# Patient Record
Sex: Female | Born: 1980 | Hispanic: Yes | Marital: Married | Smoking: Never smoker
Health system: Southern US, Community
[De-identification: ages and names within clinical notes are randomized; demographics above are authoritative.]

## PROBLEM LIST (undated history)

## (undated) ENCOUNTER — Emergency Department (HOSPITAL_COMMUNITY): Payer: Medicaid Other

## (undated) DIAGNOSIS — Z9889 Other specified postprocedural states: Secondary | ICD-10-CM

## (undated) DIAGNOSIS — N3001 Acute cystitis with hematuria: Secondary | ICD-10-CM

## (undated) DIAGNOSIS — N12 Tubulo-interstitial nephritis, not specified as acute or chronic: Secondary | ICD-10-CM

## (undated) DIAGNOSIS — K4090 Unilateral inguinal hernia, without obstruction or gangrene, not specified as recurrent: Secondary | ICD-10-CM

## (undated) DIAGNOSIS — E781 Pure hyperglyceridemia: Secondary | ICD-10-CM

## (undated) DIAGNOSIS — E119 Type 2 diabetes mellitus without complications: Secondary | ICD-10-CM

## (undated) HISTORY — PX: TUBAL LIGATION: SHX77

## (undated) HISTORY — PX: ECTOPIC PREGNANCY SURGERY: SHX613

---

## 2004-08-20 ENCOUNTER — Emergency Department: Payer: Self-pay | Admitting: Emergency Medicine

## 2004-08-21 ENCOUNTER — Ambulatory Visit: Payer: Self-pay | Admitting: Emergency Medicine

## 2005-02-07 ENCOUNTER — Observation Stay: Payer: Self-pay

## 2005-04-13 ENCOUNTER — Inpatient Hospital Stay: Payer: Self-pay

## 2008-06-13 ENCOUNTER — Emergency Department: Payer: Self-pay | Admitting: Emergency Medicine

## 2011-01-11 ENCOUNTER — Observation Stay: Payer: Self-pay | Admitting: Obstetrics and Gynecology

## 2011-01-30 ENCOUNTER — Observation Stay: Payer: Self-pay | Admitting: Obstetrics and Gynecology

## 2011-02-01 ENCOUNTER — Inpatient Hospital Stay: Payer: Self-pay

## 2011-02-28 ENCOUNTER — Ambulatory Visit: Payer: Self-pay | Admitting: Obstetrics and Gynecology

## 2011-03-01 ENCOUNTER — Ambulatory Visit: Payer: Self-pay | Admitting: Obstetrics and Gynecology

## 2017-08-26 ENCOUNTER — Emergency Department: Payer: Medicaid Other

## 2017-08-26 ENCOUNTER — Other Ambulatory Visit: Payer: Self-pay

## 2017-08-26 ENCOUNTER — Emergency Department
Admission: EM | Admit: 2017-08-26 | Discharge: 2017-08-26 | Disposition: A | Discharge status: Home / Self Care | Payer: Medicaid Other | Attending: Emergency Medicine | Admitting: Emergency Medicine

## 2017-08-26 ENCOUNTER — Encounter: Payer: Self-pay | Admitting: Emergency Medicine

## 2017-08-26 DIAGNOSIS — G510 Bell's palsy: Secondary | ICD-10-CM | POA: Diagnosis not present

## 2017-08-26 DIAGNOSIS — R51 Headache: Secondary | ICD-10-CM | POA: Diagnosis present

## 2017-08-26 LAB — URINALYSIS, COMPLETE (UACMP) WITH MICROSCOPIC
Bacteria, UA: NONE SEEN
Bilirubin Urine: NEGATIVE
Glucose, UA: NEGATIVE mg/dL
HGB URINE DIPSTICK: NEGATIVE
Ketones, ur: NEGATIVE mg/dL
Leukocytes, UA: NEGATIVE
NITRITE: NEGATIVE
PH: 5 (ref 5.0–8.0)
Protein, ur: NEGATIVE mg/dL
SPECIFIC GRAVITY, URINE: 1.026 (ref 1.005–1.030)

## 2017-08-26 LAB — CBC WITH DIFFERENTIAL/PLATELET
BASOS ABS: 0.1 10*3/uL (ref 0–0.1)
BASOS PCT: 1 %
EOS ABS: 0.2 10*3/uL (ref 0–0.7)
Eosinophils Relative: 2 %
HCT: 40.1 % (ref 35.0–47.0)
HEMOGLOBIN: 13.5 g/dL (ref 12.0–16.0)
Lymphocytes Relative: 40 %
Lymphs Abs: 4.1 10*3/uL — ABNORMAL HIGH (ref 1.0–3.6)
MCH: 29.3 pg (ref 26.0–34.0)
MCHC: 33.6 g/dL (ref 32.0–36.0)
MCV: 87.2 fL (ref 80.0–100.0)
MONOS PCT: 5 %
Monocytes Absolute: 0.5 10*3/uL (ref 0.2–0.9)
NEUTROS PCT: 52 %
Neutro Abs: 5.2 10*3/uL (ref 1.4–6.5)
Platelets: 348 10*3/uL (ref 150–440)
RBC: 4.6 MIL/uL (ref 3.80–5.20)
RDW: 13.8 % (ref 11.5–14.5)
WBC: 10.1 10*3/uL (ref 3.6–11.0)

## 2017-08-26 LAB — BASIC METABOLIC PANEL
ANION GAP: 11 (ref 5–15)
BUN: 14 mg/dL (ref 6–20)
CALCIUM: 8.9 mg/dL (ref 8.9–10.3)
CO2: 25 mmol/L (ref 22–32)
CREATININE: 0.59 mg/dL (ref 0.44–1.00)
Chloride: 102 mmol/L (ref 101–111)
GFR calc non Af Amer: >60 mL/min (ref >60)
Glucose, Bld: 158 mg/dL — ABNORMAL HIGH (ref 65–99)
Potassium: 3.8 mmol/L (ref 3.5–5.1)
SODIUM: 138 mmol/L (ref 135–145)

## 2017-08-26 MED ORDER — DIPHENHYDRAMINE HCL 50 MG/ML IJ SOLN
50.0000 mg | Freq: Once | INTRAMUSCULAR | Status: AC
  Administered 2017-08-26: 50 mg via INTRAMUSCULAR
  Filled 2017-08-26: qty 1

## 2017-08-26 MED ORDER — PREDNISONE 20 MG PO TABS: 20.0000 mg | ORAL_TABLET | Freq: Every day | ORAL | 0 refills | Status: AC

## 2017-08-26 MED ORDER — VALACYCLOVIR HCL 1 G PO TABS: 1000.0000 mg | ORAL_TABLET | Freq: Three times a day (TID) | ORAL | 0 refills | Status: DC

## 2017-08-26 MED ORDER — METHYLPREDNISOLONE SODIUM SUCC 125 MG IJ SOLR
125.0000 mg | Freq: Once | INTRAMUSCULAR | Status: AC
  Administered 2017-08-26: 125 mg via INTRAMUSCULAR
  Filled 2017-08-26: qty 2

## 2017-08-26 MED ORDER — PROMETHAZINE HCL 25 MG/ML IJ SOLN
25.0000 mg | Freq: Once | INTRAMUSCULAR | Status: AC
  Administered 2017-08-26: 25 mg via INTRAMUSCULAR
  Filled 2017-08-26: qty 1

## 2017-08-26 NOTE — ED Triage Notes (Signed)
Pt reports that she has had a headache since yesterday. No nausea. Pt states that she has been taking Ibuprofen it is not working. She reports that it is on the right side. Pt also reports that when she drinks water it comes out dribbles out her mouth.

## 2017-08-26 NOTE — ED Provider Notes (Signed)
[facility] Emergency Department Provider Note  ____________________________________________  Time seen: Approximately 3:56 PM  I have reviewed the triage vital signs and the nursing notes.   HISTORY  Chief Complaint Headache    HPI Claudia Cannon is a 37 y.o. female who presents the emergency department complaining of right occipital headache and drooling out of the right side of the mouth when trying to drink.  Patient reports that she developed a headache yesterday, noticed that she had other symptoms today.  Patient denies any numbness and tingling on one side of the body or other.  She denies any ataxia.  No history of headaches including migraines.  Patient does have allergies but states that she has not had increased allergic rhinitis symptoms.  Patient did have cold-like symptoms 3-4 weeks ago that resolved on its own with no treatment.  Patient denies any visual changes, difficulty breathing or swallowing, chest pain, shortness of breath abdominal pain, nausea vomiting, diarrhea or constipation.  History reviewed. No pertinent past medical history.  There are no active problems to display for this patient.   History reviewed. No pertinent surgical history.  Prior to Admission medications   Medication Sig Start Date End Date Taking? Authorizing Provider  predniSONE (DELTASONE) 20 MG tablet Take 1 tablet (20 mg total) by mouth daily for 21 days. Take 3 tablets daily for 1 week, then take 2 tablets daily for 1 week, and take 1 tablet daily for one week 08/26/17 09/16/17  Venida Tsukamoto, Delorise Royals, PA-C  valACYclovir (VALTREX) 1000 MG tablet Take 1 tablet (1,000 mg total) by mouth 3 (three) times daily. 08/26/17   Dalayla Aldredge, Delorise Royals, PA-C    Allergies Patient has no known allergies.  No family history on file.  Social History Social History   Tobacco Use  . Smoking status: Never Smoker  Substance Use Topics  . Alcohol use: No    Frequency:  Never  . Drug use: No     Review of Systems  Constitutional: No fever/chills Eyes: No visual changes. No discharge ENT: Drooling from the right side of mouth while trying to drink. Cardiovascular: no chest pain. Respiratory: no cough. No SOB. Gastrointestinal: No abdominal pain.  No nausea, no vomiting.  No diarrhea.  No constipation. Musculoskeletal: Negative for musculoskeletal pain. Skin: Negative for rash, abrasions, lacerations, ecchymosis. Neurological: Positive for occipital headache.  Positive for "drooling" on the right side of the mouth when drinking. 10-point ROS otherwise negative.  ____________________________________________   PHYSICAL EXAM:  VITAL SIGNS: ED Triage Vitals  Enc Vitals Group     BP 08/26/17 1322 (!) 164/80     Pulse Rate 08/26/17 1322 98     Resp 08/26/17 1322 20     Temp 08/26/17 1322 98.7 F (37.1 C)     Temp Source 08/26/17 1322 Oral     SpO2 08/26/17 1322 97 %     Weight 08/26/17 1319 150 lb (68 kg)     Height 08/26/17 1319 5' (1.524 m)     Head Circumference --      Peak Flow --      Pain Score 08/26/17 1319 9     Pain Loc --      Pain Edu? --      Excl. in GC? --      Constitutional: Alert and oriented. Well appearing and in no acute distress. Eyes: Conjunctivae are normal. PERRL. EOMI. eyelid on right is not spontaneously closing.  Patient can close eyelid with concentrated effort.  Head: Atraumatic. ENT:      Ears:       Nose: No congestion/rhinnorhea.      Mouth/Throat: Mucous membranes are moist.  Neck: No stridor.   Hematological/Lymphatic/Immunilogical: No cervical lymphadenopathy. Cardiovascular: Normal rate, regular rhythm. Normal S1 and S2.  Good peripheral circulation. Respiratory: Normal respiratory effort without tachypnea or retractions. Lungs CTAB. Good air entry to the bases with no decreased or absent breath sounds. Musculoskeletal: Full range of motion to all extremities. No gross deformities  appreciated. Neurologic:  Normal speech and language. No gross focal neurologic deficits are appreciated. No facial droop noted.  Right eyelid does not close without concentrated effort.  Patient with mild deficits to facial/trigeminal nerve testing.  Otherwise, cranial nerves grossly intact.  Sensation intact all dermatomal distributions of the face. Skin:  Skin is warm, dry and intact. No rash noted.   Psychiatric: Mood and affect are normal. Speech and behavior are normal. Patient exhibits appropriate insight and judgement.   ____________________________________________   LABS (all labs ordered are listed, but only abnormal results are displayed)  Labs Reviewed  CBC WITH DIFFERENTIAL/PLATELET - Abnormal; Notable for the following components:      Result Value   Lymphs Abs 4.1 (*)    All other components within normal limits  BASIC METABOLIC PANEL - Abnormal; Notable for the following components:   Glucose, Bld 158 (*)    All other components within normal limits  URINALYSIS, COMPLETE (UACMP) WITH MICROSCOPIC - Abnormal; Notable for the following components:   Color, Urine YELLOW (*)    APPearance HAZY (*)    Squamous Epithelial / LPF 0-5 (*)    All other components within normal limits   ____________________________________________  EKG   ____________________________________________  RADIOLOGY Festus BarrenI, Kasee Hantz D Symiah Nowotny, personally viewed and evaluated these images (plain radiographs) as part of my medical decision making, as well as reviewing the written report by the radiologist.  Ct Head Wo Contrast  Result Date: 08/26/2017 CLINICAL DATA:  Chronic headaches EXAM: CT HEAD WITHOUT CONTRAST TECHNIQUE: Contiguous axial images were obtained from the base of the skull through the vertex without intravenous contrast. COMPARISON:  None. FINDINGS: Brain: No evidence of acute infarction, hemorrhage, hydrocephalus, extra-axial collection or mass lesion/mass effect. Vascular: No  hyperdense vessel or unexpected calcification. Skull: Normal. Negative for fracture or focal lesion. Sinuses/Orbits: No acute finding. Other: None. IMPRESSION: No acute intracranial abnormality noted. Electronically Signed   By: Alcide CleverMark  Lukens M.D.   On: 08/26/2017 13:53    ____________________________________________    PROCEDURES  Procedure(s) performed:    Procedures    Medications  promethazine (PHENERGAN) injection 25 mg (25 mg Intramuscular Given 08/26/17 1619)  diphenhydrAMINE (BENADRYL) injection 50 mg (50 mg Intramuscular Given 08/26/17 1620)  methylPREDNISolone sodium succinate (SOLU-MEDROL) 125 mg/2 mL injection 125 mg (125 mg Intramuscular Given 08/26/17 1621)     ____________________________________________   INITIAL IMPRESSION / ASSESSMENT AND PLAN / ED COURSE  Pertinent labs & imaging results that were available during my care of the patient were reviewed by me and considered in my medical decision making (see chart for details).  Review of the [place] CSRS was performed in accordance of the [facility] prior to dispensing any controlled drugs.     Patient's diagnosis is consistent with Bell's palsy.  Patient presented to the emergency department complaining of drooling out of the right side of her mouth, headache.  Initial differential included CVA both ischemic and hemorrhagic, migraine, tension type headache, sinusitis, sinus headache, Bell's palsy.  CT  scan reveals no acute intracranial or osseous abnormality.  On exam, patient had deficits in facial and trigeminal nerve testing with difficulty closing the right eyelid.  On exam, patient symptoms are most consistent with Bell's palsy.  She did have a recent viral infection 3 weeks prior.  At this time, patient is given medications for her headache is giving her a migraine cocktail emergency department.  Patient will be placed on antivirals and prednisone for Bell's palsy.  Patient does have a primary care provider and is to  follow-up with them closely for continued monitoring through her course of Bell's palsy.  Patient is given instructions on what to expect.  Patient is given return precautions should anything change prior to seeing primary care.  Patient is given ED precautions to return to the ED for any worsening or new symptoms.     ____________________________________________  FINAL CLINICAL IMPRESSION(S) / ED DIAGNOSES  Final diagnoses:  Bell's palsy      NEW MEDICATIONS STARTED DURING THIS VISIT:  ED Discharge Orders        Ordered    valACYclovir (VALTREX) 1000 MG tablet  3 times daily     08/26/17 1709    predniSONE (DELTASONE) 20 MG tablet  Daily     08/26/17 1709          This chart was dictated using voice recognition software/Dragon. Despite best efforts to proofread, errors can occur which can change the meaning. Any change was purely unintentional.    Racheal Patches, PA-C 08/26/17 1712    Phineas Semen, MD 08/26/17 1946

## 2017-08-26 NOTE — ED Triage Notes (Signed)
Pt reports that the water dripping out her mouth when swishing water in her mouth at 0500 while brushing her teeth. No facial droop or other neuro symptoms noted. Headache started yesterday.

## 2017-08-27 LAB — POCT PREGNANCY, URINE: Preg Test, Ur: NEGATIVE

## 2017-09-20 ENCOUNTER — Emergency Department: Payer: Medicaid Other

## 2017-09-20 ENCOUNTER — Inpatient Hospital Stay: Payer: Medicaid Other

## 2017-09-20 ENCOUNTER — Inpatient Hospital Stay
Admission: EM | Admit: 2017-09-20 | Discharge: 2017-09-24 | DRG: 439 | Disposition: A | Discharge status: Home / Self Care | Payer: Medicaid Other | Attending: Internal Medicine | Admitting: Internal Medicine

## 2017-09-20 ENCOUNTER — Encounter: Payer: Self-pay | Admitting: Emergency Medicine

## 2017-09-20 ENCOUNTER — Other Ambulatory Visit: Payer: Self-pay

## 2017-09-20 DIAGNOSIS — K8581 Other acute pancreatitis with uninfected necrosis: Secondary | ICD-10-CM | POA: Diagnosis not present

## 2017-09-20 DIAGNOSIS — R739 Hyperglycemia, unspecified: Secondary | ICD-10-CM

## 2017-09-20 DIAGNOSIS — E1165 Type 2 diabetes mellitus with hyperglycemia: Secondary | ICD-10-CM | POA: Diagnosis present

## 2017-09-20 DIAGNOSIS — K859 Acute pancreatitis without necrosis or infection, unspecified: Secondary | ICD-10-CM | POA: Diagnosis present

## 2017-09-20 DIAGNOSIS — Z8249 Family history of ischemic heart disease and other diseases of the circulatory system: Secondary | ICD-10-CM | POA: Diagnosis not present

## 2017-09-20 DIAGNOSIS — K858 Other acute pancreatitis without necrosis or infection: Secondary | ICD-10-CM | POA: Diagnosis not present

## 2017-09-20 DIAGNOSIS — E781 Pure hyperglyceridemia: Secondary | ICD-10-CM | POA: Diagnosis present

## 2017-09-20 DIAGNOSIS — Z841 Family history of disorders of kidney and ureter: Secondary | ICD-10-CM | POA: Diagnosis not present

## 2017-09-20 DIAGNOSIS — E785 Hyperlipidemia, unspecified: Secondary | ICD-10-CM

## 2017-09-20 DIAGNOSIS — Z8349 Family history of other endocrine, nutritional and metabolic diseases: Secondary | ICD-10-CM | POA: Diagnosis not present

## 2017-09-20 DIAGNOSIS — E871 Hypo-osmolality and hyponatremia: Secondary | ICD-10-CM | POA: Diagnosis present

## 2017-09-20 DIAGNOSIS — R109 Unspecified abdominal pain: Secondary | ICD-10-CM

## 2017-09-20 DIAGNOSIS — E876 Hypokalemia: Secondary | ICD-10-CM | POA: Diagnosis not present

## 2017-09-20 DIAGNOSIS — K760 Fatty (change of) liver, not elsewhere classified: Secondary | ICD-10-CM | POA: Diagnosis present

## 2017-09-20 DIAGNOSIS — Z833 Family history of diabetes mellitus: Secondary | ICD-10-CM | POA: Diagnosis not present

## 2017-09-20 LAB — PHOSPHORUS
PHOSPHORUS: 2.7 mg/dL (ref 2.5–4.6)
Phosphorus: 2.5 mg/dL (ref 2.5–4.6)
Phosphorus: 2 mg/dL — ABNORMAL LOW (ref 2.5–4.6)
Phosphorus: 1.7 mg/dL — ABNORMAL LOW (ref 2.5–4.6)
Phosphorus: 2.5 mg/dL (ref 2.5–4.6)

## 2017-09-20 LAB — COMPREHENSIVE METABOLIC PANEL
ALT: 129 U/L — ABNORMAL HIGH (ref 14–54)
ANION GAP: 10 (ref 5–15)
AST: 72 U/L — ABNORMAL HIGH (ref 15–41)
Albumin: 3.7 g/dL (ref 3.5–5.0)
Alkaline Phosphatase: 66 U/L (ref 38–126)
BILIRUBIN TOTAL: 0.9 mg/dL (ref 0.3–1.2)
BUN: 14 mg/dL (ref 6–20)
CALCIUM: 9.1 mg/dL (ref 8.9–10.3)
CO2: 25 mmol/L (ref 22–32)
CREATININE: 0.56 mg/dL (ref 0.44–1.00)
Chloride: 100 mmol/L — ABNORMAL LOW (ref 101–111)
GFR calc non Af Amer: >60 mL/min (ref >60)
Glucose, Bld: 197 mg/dL — ABNORMAL HIGH (ref 65–99)
Potassium: 3.5 mmol/L (ref 3.5–5.1)
Sodium: 135 mmol/L (ref 135–145)
TOTAL PROTEIN: 6.7 g/dL (ref 6.5–8.1)

## 2017-09-20 LAB — CBC WITH DIFFERENTIAL/PLATELET
Basophils Absolute: 0.1 10*3/uL (ref 0–0.1)
Basophils Relative: 1 %
Eosinophils Absolute: 0.1 10*3/uL (ref 0–0.7)
Eosinophils Relative: 1 %
HEMATOCRIT: 37 % (ref 35.0–47.0)
HEMOGLOBIN: 12.9 g/dL (ref 12.0–16.0)
LYMPHS PCT: 15 %
Lymphs Abs: 2.4 10*3/uL (ref 1.0–3.6)
MCH: 30.9 pg (ref 26.0–34.0)
MCHC: 34.9 g/dL (ref 32.0–36.0)
MCV: 88.5 fL (ref 80.0–100.0)
MONO ABS: 0.7 10*3/uL (ref 0.2–0.9)
MONOS PCT: 5 %
NEUTROS ABS: 12.5 10*3/uL — AB (ref 1.4–6.5)
Neutrophils Relative %: 78 %
Platelets: 216 10*3/uL (ref 150–440)
RBC: 4.18 MIL/uL (ref 3.80–5.20)
RDW: 14.4 % (ref 11.5–14.5)
WBC: 15.9 10*3/uL — ABNORMAL HIGH (ref 3.6–11.0)

## 2017-09-20 LAB — GLUCOSE, CAPILLARY
GLUCOSE-CAPILLARY: 206 mg/dL — AB (ref 65–99)
Glucose-Capillary: 226 mg/dL — ABNORMAL HIGH (ref 65–99)
Glucose-Capillary: 238 mg/dL — ABNORMAL HIGH (ref 65–99)
Glucose-Capillary: 204 mg/dL — ABNORMAL HIGH (ref 65–99)
Glucose-Capillary: 213 mg/dL — ABNORMAL HIGH (ref 65–99)
Glucose-Capillary: 190 mg/dL — ABNORMAL HIGH (ref 65–99)
Glucose-Capillary: 199 mg/dL — ABNORMAL HIGH (ref 65–99)
Glucose-Capillary: 164 mg/dL — ABNORMAL HIGH (ref 65–99)
Glucose-Capillary: 157 mg/dL — ABNORMAL HIGH (ref 65–99)
Glucose-Capillary: 164 mg/dL — ABNORMAL HIGH (ref 65–99)
Glucose-Capillary: 164 mg/dL — ABNORMAL HIGH (ref 65–99)
Glucose-Capillary: 148 mg/dL — ABNORMAL HIGH (ref 65–99)

## 2017-09-20 LAB — BASIC METABOLIC PANEL
ANION GAP: 8 (ref 5–15)
Anion gap: 10 (ref 5–15)
Anion gap: 8 (ref 5–15)
Anion gap: 11 (ref 5–15)
BUN: 9 mg/dL (ref 6–20)
BUN: 6 mg/dL (ref 6–20)
CALCIUM: 7.4 mg/dL — AB (ref 8.9–10.3)
CALCIUM: 7.4 mg/dL — AB (ref 8.9–10.3)
CALCIUM: 7.1 mg/dL — AB (ref 8.9–10.3)
CALCIUM: 6.9 mg/dL — AB (ref 8.9–10.3)
CO2: 24 mmol/L (ref 22–32)
CO2: 23 mmol/L (ref 22–32)
CO2: 25 mmol/L (ref 22–32)
CO2: 24 mmol/L (ref 22–32)
Chloride: 91 mmol/L — ABNORMAL LOW (ref 101–111)
Chloride: 88 mmol/L — ABNORMAL LOW (ref 101–111)
Chloride: 88 mmol/L — ABNORMAL LOW (ref 101–111)
Chloride: 88 mmol/L — ABNORMAL LOW (ref 101–111)
Creatinine, Ser: 0.36 mg/dL — ABNORMAL LOW (ref 0.44–1.00)
Creatinine, Ser: <0.3 mg/dL — ABNORMAL LOW (ref 0.44–1.00)
Creatinine, Ser: 0.31 mg/dL — ABNORMAL LOW (ref 0.44–1.00)
GFR calc Af Amer: >60 mL/min (ref >60)
GFR calc Af Amer: >60 mL/min (ref >60)
GLUCOSE: 212 mg/dL — AB (ref 65–99)
GLUCOSE: 202 mg/dL — AB (ref 65–99)
GLUCOSE: 159 mg/dL — AB (ref 65–99)
GLUCOSE: 152 mg/dL — AB (ref 65–99)
Potassium: 3.5 mmol/L (ref 3.5–5.1)
Potassium: 3.8 mmol/L (ref 3.5–5.1)
Potassium: 3.5 mmol/L (ref 3.5–5.1)
Potassium: 2.9 mmol/L — ABNORMAL LOW (ref 3.5–5.1)
Sodium: 123 mmol/L — ABNORMAL LOW (ref 135–145)
Sodium: 121 mmol/L — ABNORMAL LOW (ref 135–145)
Sodium: 121 mmol/L — ABNORMAL LOW (ref 135–145)
Sodium: 123 mmol/L — ABNORMAL LOW (ref 135–145)

## 2017-09-20 LAB — URINALYSIS, COMPLETE (UACMP) WITH MICROSCOPIC
Bilirubin Urine: NEGATIVE
GLUCOSE, UA: NEGATIVE mg/dL
HGB URINE DIPSTICK: NEGATIVE
Ketones, ur: 20 mg/dL — AB
NITRITE: NEGATIVE
PH: 5 (ref 5.0–8.0)
PROTEIN: NEGATIVE mg/dL
Specific Gravity, Urine: 1.024 (ref 1.005–1.030)

## 2017-09-20 LAB — LIPASE, BLOOD: Lipase: 620 U/L — ABNORMAL HIGH (ref 11–51)

## 2017-09-20 LAB — MRSA PCR SCREENING: MRSA BY PCR: NEGATIVE

## 2017-09-20 LAB — CBC
HCT: 37 % (ref 35.0–47.0)
HEMOGLOBIN: UNDETERMINED g/dL (ref 12.0–16.0)
MCH: UNDETERMINED pg (ref 26.0–34.0)
MCHC: UNDETERMINED g/dL (ref 32.0–36.0)
MCV: 88.2 fL (ref 80.0–100.0)
PLATELETS: 249 10*3/uL (ref 150–440)
RBC: 4.19 MIL/uL (ref 3.80–5.20)
RDW: 14.4 % (ref 11.5–14.5)
WBC: 16.5 10*3/uL — ABNORMAL HIGH (ref 3.6–11.0)

## 2017-09-20 LAB — HEMOGLOBIN A1C
Hgb A1c MFr Bld: 7.4 % — ABNORMAL HIGH (ref 4.8–5.6)
MEAN PLASMA GLUCOSE: 165.68 mg/dL

## 2017-09-20 LAB — POCT PREGNANCY, URINE: Preg Test, Ur: NEGATIVE

## 2017-09-20 LAB — LACTIC ACID, PLASMA
LACTIC ACID, VENOUS: 4.4 mmol/L — AB (ref 0.5–1.9)
LACTIC ACID, VENOUS: 4.4 mmol/L — AB (ref 0.5–1.9)
Lactic Acid, Venous: 3.4 mmol/L (ref 0.5–1.9)

## 2017-09-20 LAB — MAGNESIUM
MAGNESIUM: 2.2 mg/dL (ref 1.7–2.4)
Magnesium: 3.4 mg/dL — ABNORMAL HIGH (ref 1.7–2.4)
Magnesium: 3.1 mg/dL — ABNORMAL HIGH (ref 1.7–2.4)
Magnesium: 3 mg/dL — ABNORMAL HIGH (ref 1.7–2.4)
Magnesium: 2.5 mg/dL — ABNORMAL HIGH (ref 1.7–2.4)

## 2017-09-20 LAB — TRIGLYCERIDES
Triglycerides: >5000 mg/dL — ABNORMAL HIGH (ref <150)
Triglycerides: >5000 mg/dL — ABNORMAL HIGH (ref <150)

## 2017-09-20 LAB — TROPONIN I: Troponin I: <0.03 ng/mL (ref <0.03)

## 2017-09-20 LAB — TSH: TSH: 1.766 u[IU]/mL (ref 0.350–4.500)

## 2017-09-20 MED ORDER — ACETAMINOPHEN 325 MG PO TABS
650.0000 mg | ORAL_TABLET | ORAL | Status: DC | PRN
  Administered 2017-09-21 – 2017-09-24 (×6): 650 mg via ORAL
  Filled 2017-09-20 (×6): qty 2

## 2017-09-20 MED ORDER — FENOFIBRATE 160 MG PO TABS
160.0000 mg | ORAL_TABLET | Freq: Every day | ORAL | Status: DC
  Administered 2017-09-20 – 2017-09-24 (×4): 160 mg via ORAL
  Filled 2017-09-20 (×5): qty 1

## 2017-09-20 MED ORDER — SODIUM CHLORIDE 0.9 % IV BOLUS
1000.0000 mL | Freq: Once | INTRAVENOUS | Status: AC
  Administered 2017-09-20: 1000 mL via INTRAVENOUS

## 2017-09-20 MED ORDER — SODIUM CHLORIDE 0.9 % IV SOLN
INTRAVENOUS | Status: DC
  Administered 2017-09-20 – 2017-09-22 (×4): via INTRAVENOUS
  Filled 2017-09-20 (×8): qty 100

## 2017-09-20 MED ORDER — MORPHINE SULFATE (PF) 2 MG/ML IV SOLN
INTRAVENOUS | Status: AC
  Filled 2017-09-20: qty 1

## 2017-09-20 MED ORDER — POTASSIUM CHLORIDE 10 MEQ/100ML IV SOLN
10.0000 meq | INTRAVENOUS | Status: AC
  Administered 2017-09-20 (×2): 10 meq via INTRAVENOUS
  Filled 2017-09-20 (×2): qty 100

## 2017-09-20 MED ORDER — NIACIN ER 500 MG PO TBCR
500.0000 mg | EXTENDED_RELEASE_TABLET | Freq: Every day | ORAL | Status: DC
Start: 2017-09-20 — End: 2017-09-24
  Administered 2017-09-21 – 2017-09-23 (×3): 500 mg via ORAL
  Filled 2017-09-20 (×5): qty 1

## 2017-09-20 MED ORDER — HYDROMORPHONE HCL 1 MG/ML IJ SOLN
1.0000 mg | INTRAMUSCULAR | Status: DC | PRN
Start: 2017-09-20 — End: 2017-09-24
  Administered 2017-09-20 – 2017-09-23 (×15): 1 mg via INTRAVENOUS
  Filled 2017-09-20 (×16): qty 1

## 2017-09-20 MED ORDER — HEPARIN SODIUM (PORCINE) 5000 UNIT/ML IJ SOLN
5000.0000 [IU] | Freq: Three times a day (TID) | INTRAMUSCULAR | Status: DC
  Administered 2017-09-20 – 2017-09-23 (×8): 5000 [IU] via SUBCUTANEOUS
  Filled 2017-09-20 (×8): qty 1

## 2017-09-20 MED ORDER — MORPHINE SULFATE (PF) 2 MG/ML IV SOLN
2.0000 mg | Freq: Once | INTRAVENOUS | Status: AC
  Administered 2017-09-20: 2 mg via INTRAVENOUS

## 2017-09-20 MED ORDER — ONDANSETRON HCL 4 MG/2ML IJ SOLN
4.0000 mg | Freq: Four times a day (QID) | INTRAMUSCULAR | Status: DC | PRN
Start: 2017-09-20 — End: 2017-09-24
  Administered 2017-09-20 – 2017-09-21 (×4): 4 mg via INTRAVENOUS
  Filled 2017-09-20 (×4): qty 2

## 2017-09-20 MED ORDER — DEXTROSE-NACL 5-0.45 % IV SOLN
INTRAVENOUS | Status: DC
  Administered 2017-09-20: 08:00:00 via INTRAVENOUS

## 2017-09-20 MED ORDER — SODIUM CHLORIDE 0.9 % IV SOLN: INTRAVENOUS | Status: DC

## 2017-09-20 MED ORDER — MORPHINE SULFATE (PF) 4 MG/ML IV SOLN
4.0000 mg | Freq: Once | INTRAVENOUS | Status: AC
  Administered 2017-09-20: 4 mg via INTRAVENOUS
  Filled 2017-09-20: qty 1

## 2017-09-20 MED ORDER — SODIUM CHLORIDE 0.9 % IV SOLN
INTRAVENOUS | Status: DC
  Administered 2017-09-20 – 2017-09-21 (×3): via INTRAVENOUS

## 2017-09-20 MED ORDER — SODIUM CHLORIDE 0.9 % IV SOLN
INTRAVENOUS | Status: DC
  Administered 2017-09-20: 1.7 [IU]/h via INTRAVENOUS
  Filled 2017-09-20: qty 1

## 2017-09-20 MED ORDER — IOPAMIDOL (ISOVUE-300) INJECTION 61%
100.0000 mL | Freq: Once | INTRAVENOUS | Status: AC | PRN
  Administered 2017-09-20: 100 mL via INTRAVENOUS

## 2017-09-20 MED ORDER — DEXTROSE 10 % IV SOLN
INTRAVENOUS | Status: DC
  Administered 2017-09-20 – 2017-09-21 (×3): via INTRAVENOUS

## 2017-09-20 MED ORDER — SODIUM CHLORIDE 0.9 % IV SOLN
INTRAVENOUS | Status: AC
  Administered 2017-09-20: 08:00:00 via INTRAVENOUS

## 2017-09-20 MED ORDER — ROSUVASTATIN CALCIUM 20 MG PO TABS
40.0000 mg | ORAL_TABLET | Freq: Every day | ORAL | Status: DC
  Administered 2017-09-22 – 2017-09-23 (×2): 40 mg via ORAL
  Filled 2017-09-20 (×2): qty 2
  Filled 2017-09-20: qty 1
  Filled 2017-09-20: qty 4
  Filled 2017-09-20: qty 1
  Filled 2017-09-20: qty 4
  Filled 2017-09-20: qty 1

## 2017-09-20 MED ORDER — ENOXAPARIN SODIUM 40 MG/0.4ML ~~LOC~~ SOLN
40.0000 mg | SUBCUTANEOUS | Status: DC
  Administered 2017-09-20: 40 mg via SUBCUTANEOUS
  Filled 2017-09-20: qty 0.4

## 2017-09-20 MED ORDER — SODIUM PHOSPHATES 45 MMOLE/15ML IV SOLN
10.0000 mmol | Freq: Once | INTRAVENOUS | Status: AC
  Administered 2017-09-20: 10 mmol via INTRAVENOUS
  Filled 2017-09-20: qty 3.33

## 2017-09-20 MED ORDER — POTASSIUM CHLORIDE 10 MEQ/100ML IV SOLN
10.0000 meq | INTRAVENOUS | Status: DC
  Filled 2017-09-20 (×6): qty 100

## 2017-09-20 MED ORDER — POTASSIUM CHLORIDE 10 MEQ/100ML IV SOLN
10.0000 meq | INTRAVENOUS | Status: AC
  Administered 2017-09-20 – 2017-09-21 (×5): 10 meq via INTRAVENOUS
  Filled 2017-09-20 (×5): qty 100

## 2017-09-20 MED ORDER — ORAL CARE MOUTH RINSE
15.0000 mL | Freq: Two times a day (BID) | OROMUCOSAL | Status: DC
  Administered 2017-09-20 – 2017-09-23 (×4): 15 mL via OROMUCOSAL

## 2017-09-20 MED ORDER — ACETAMINOPHEN 650 MG RE SUPP
650.0000 mg | Freq: Four times a day (QID) | RECTAL | Status: DC | PRN
  Filled 2017-09-20: qty 1

## 2017-09-20 MED ORDER — SENNOSIDES-DOCUSATE SODIUM 8.6-50 MG PO TABS: 2.0000 | ORAL_TABLET | Freq: Two times a day (BID) | ORAL | Status: DC | PRN

## 2017-09-20 MED ORDER — IOPAMIDOL (ISOVUE-300) INJECTION 61%
15.0000 mL | INTRAVENOUS | Status: AC
  Administered 2017-09-20 (×2): 15 mL via ORAL

## 2017-09-20 MED ORDER — HYDROMORPHONE HCL 1 MG/ML IJ SOLN
2.0000 mg | INTRAMUSCULAR | Status: DC | PRN
  Administered 2017-09-20 – 2017-09-24 (×12): 2 mg via INTRAVENOUS
  Filled 2017-09-20 (×12): qty 2

## 2017-09-20 MED ORDER — ONDANSETRON HCL 4 MG/2ML IJ SOLN
4.0000 mg | Freq: Once | INTRAMUSCULAR | Status: AC
  Administered 2017-09-20: 4 mg via INTRAVENOUS
  Filled 2017-09-20: qty 2

## 2017-09-20 MED ORDER — ACETAMINOPHEN 650 MG RE SUPP
650.0000 mg | Freq: Four times a day (QID) | RECTAL | Status: DC | PRN
  Administered 2017-09-20: 650 mg via RECTAL

## 2017-09-20 NOTE — Progress Notes (Signed)
MEDICATION RELATED CONSULT NOTE - INITIAL   Pharmacy Consult for Electrolyte Management Indication: Pancreatitis on Insulin drip  No Known Allergies  Patient Measurements: Height: 5' (152.4 cm) Weight: 150 lb (68 kg) IBW/kg (Calculated) : 45.5 Adjusted Body Weight:    Vital Signs: Temp: 97.9 F (36.6 C) (04/13 0908) Temp Source: Oral (04/13 0908) BP: 126/76 (04/13 0900) Pulse Rate: 94 (04/13 0900) Intake/Output from previous day: 04/12 0701 - 04/13 0700 In: 1000 [IV Piggyback:1000] Out: -  Intake/Output from this shift: Total I/O In: 1045.9 [I.V.:945.9; IV Piggyback:100] Out: -   Labs: Recent Labs    09/20/17 0201 09/20/17 0610 09/20/17 0836 09/20/17 1258  WBC 16.5*  --   --   --   HGB UNABLE TO REPORT DUE TO LIPEMIC INTERFERENCE  --   --   --   HCT 37.0  --   --   --   PLT 249  --   --   --   CREATININE 0.56  --  0.36* <0.30*  MG  --  3.4* 3.1* 3.0*  PHOS  --  2.7 2.5 2.0*  ALBUMIN 3.7  --   --   --   PROT 6.7  --   --   --   AST 72*  --   --   --   ALT 129*  --   --   --   ALKPHOS 66  --   --   --   BILITOT 0.9  --   --   --    CrCl cannot be calculated (This lab value cannot be used to calculate CrCl because it is not a number: <0.30).   Microbiology: Recent Results (from the past 720 hour(s))  MRSA PCR Screening     Status: None   Collection Time: 09/20/17  9:17 AM  Result Value Ref Range Status   MRSA by PCR NEGATIVE NEGATIVE Final    Comment:        The GeneXpert MRSA Assay (FDA approved for NASAL specimens only), is one component of a comprehensive MRSA colonization surveillance program. It is not intended to diagnose MRSA infection nor to guide or monitor treatment for MRSA infections. Performed at [facility], [address] [place], [place] [id number]     Medical History: History reviewed. No pertinent past medical history.   Assessment: Patient is a 37yo female admitted for pancreatitis. Triglycerides on admission were  elevated > 5000. Patient was initiated on insulin drip and IV fluids. Now patient with electrolyte abnormalities.  K 3.8, Mg 3.0, Phos 2.0, Na 121  Goal of Therapy:  Maintain electrolytes within range  Plan:  Will order NaPhos 10mmol IV once. Will follow up on labs.  Clovia CuffLisa Boluwatife Flight, PharmD, BCPS 09/20/2017 2:45 PM

## 2017-09-20 NOTE — Consult Note (Signed)
[facility] Consultation      Name: Claudia NimrodMargarita Alegria Cannon MRN: [id number] DOB: 1980/11/03    ADMISSION DATE:  09/20/2017 CONSULTATION DATE:  09/20/2017  REFERRING MD :  Barbaraann RondoSridharan, Prasanna, MD   CHIEF COMPLAINT:   Abdominal pain x 10 days   HISTORY OF PRESENT ILLNESS    Claudia NimrodMargarita Alegria Cannon  is a 37 y.o. female with a no known past medical history, who presents with abdominal pain worse in the epighatrium. Pt is Spanish-speaking, and speaks minimal [place]. Her daughter is at the bedside, and is able to translate. Patient states she has been experiencing mild postprandial epigastric pain for the past 10 days. She characterizes the discomfort as a dull pressure/ache. She endorses rapid/early satiety, but denies any progression of the pain over the course of the past 10 days until day of admission. She states that last night, the pain suddenly increased in severity, and became unbearable. The pain radiates to the lower chest at times, and she states she has had difficulty breathing 2/2 pain. She endorses abdominal distension and gas/discomfort. She endorses loss of appetite, but denies nausea and/or vomiting. She denies hematemesis, melena/hematochezia or urinary symptoms. She states she does not have any known active medical problems, and is not presently taking any medications. She does not drink alcohol. Her last PO intake was at ~1700PM on Friday (09/19/2017). Denies F/C, palpitations, diaphoresis, LH/LOC. She is in moderate abdominal discomfort at the time of my examination, but is otherwise healthy appearing.    I have seen and examined her.    SIGNIFICANT EVENTS   4/13 admitted to ICU    PAST MEDICAL HISTORY    :  History reviewed. No pertinent past medical history. History reviewed. No pertinent surgical history. Prior to Admission medications   Medication Sig Start Date End Date Taking? Authorizing Provider  valACYclovir (VALTREX) 1000 MG  tablet Take 1 tablet (1,000 mg total) by mouth 3 (three) times daily. Patient not taking: Reported on 09/20/2017 08/26/17   Cuthriell, Delorise RoyalsJonathan D, PA-C   No Known Allergies   FAMILY HISTORY   Family History  Problem Relation Age of Onset  . Diabetes Mother   . Hypertension Mother   . Hyperlipidemia Mother   . Chronic Renal Failure Mother       SOCIAL HISTORY    reports that she has never smoked. She has never used smokeless tobacco. She reports that she does not drink alcohol or use drugs.  Review of Systems  Constitutional: Positive for malaise/fatigue. Negative for chills, diaphoresis, fever and weight loss.  HENT: Negative for congestion, hearing loss, sore throat and tinnitus.   Eyes: Negative for blurred vision, double vision and redness.  Respiratory: Positive for shortness of breath (+) SOB associated w/ AP/CP per HPI. Negative for cough, hemoptysis, sputum production and wheezing.   Cardiovascular: Positive for chest pain (+) AP radiating to chest per HPI. Negative for palpitations, orthopnea, claudication, leg swelling and PND.  Gastrointestinal: Positive for abdominal pain. Negative for blood in stool, constipation, diarrhea, heartburn, melena, nausea and vomiting.  Genitourinary: Negative for dysuria, frequency, hematuria and urgency.  Musculoskeletal: Negative for back pain, joint pain and neck pain.  Skin: Negative for itching and rash.  Neurological: Positive for weakness (+) generalized weakness. Negative for dizziness, tingling, tremors, focal weakness, loss of consciousness and headaches.       VITAL SIGNS    Temp:  [97.9 F (36.6 C)-98 F (36.7 C)] 97.9 F (36.6 C) (04/13 0908) Pulse Rate:  [  89-98] 94 (04/13 0900) Resp:  [13-26] 19 (04/13 0900) BP: (104-126)/(51-76) 126/76 (04/13 0900) SpO2:  [94 %-98 %] 98 % (04/13 0900) Weight:  [150 lb (68 kg)] 150 lb (68 kg) (04/13 0152)   HEMODYNAMICS:  no compromise  OXYGEN:  Room air  INTAKE /  OUTPUT:  Intake/Output Summary (Last 24 hours) at 09/20/2017 1151 Last data filed at 09/20/2017 0900 Gross per 24 hour  Intake 2045.86 ml  Output -  Net 2045.86 ml       PHYSICAL EXAM   GENERAL:  37 y.o.-year-old patient lying in the bed with no acute distress.  EYES: Pupils equal, round, reactive to light and accommodation. No scleral icterus. Extraocular muscles intact.  HEENT: Head atraumatic, normocephalic. Oropharynx and nasopharynx clear.  NECK:  Supple, no jugular venous distention. No thyroid enlargement, no tenderness.  LUNGS: Normal breath sounds bilaterally, no wheezing, rales,rhonchi or crepitation. No use of accessory muscles of respiration.  CARDIOVASCULAR: S1, S2 normal. No murmurs, rubs, or gallops.  ABDOMEN: Soft, 3+tenderness, 1+ distended. Inaudible Bowel sounds present. No organomegaly or mass.  EXTREMITIES: No pedal edema, cyanosis, or clubbing.  NEUROLOGIC: Cranial nerves II through XII are intact. Muscle strength 5/5 in all extremities. Sensation intact. Gait not checked.  PSYCHIATRIC: The patient is alert and oriented x 3.  SKIN: No obvious rash, lesion, or ulcer.        LABS   LABS:  CBC Recent Labs  Lab 09/20/17 0201  WBC 16.5*  HGB UNABLE TO REPORT DUE TO LIPEMIC INTERFERENCE  HCT 37.0  PLT 249   Coag's No results for input(s): APTT, INR in the last 168 hours. BMET Recent Labs  Lab 09/20/17 0201 09/20/17 0836  NA 135 123*  K 3.5 3.5  CL 100* 91*  CO2 25 24  BUN 14 9  CREATININE 0.56 0.36*  GLUCOSE 197* 212*   Electrolytes Recent Labs  Lab 09/20/17 0201 09/20/17 0610 09/20/17 0836  CALCIUM 9.1  --  7.4*  MG  --  3.4* 3.1*  PHOS  --  2.7 2.5   Sepsis Markers Recent Labs  Lab 09/20/17 1026  LATICACIDVEN 4.4*   ABG No results for input(s): PHART, PCO2ART, PO2ART in the last 168 hours. Liver Enzymes Recent Labs  Lab 09/20/17 0201  AST 72*  ALT 129*  ALKPHOS 66  BILITOT 0.9  ALBUMIN 3.7   Cardiac  Enzymes Recent Labs  Lab 09/20/17 0201  TROPONINI <0.03   Glucose Recent Labs  Lab 09/20/17 0759 09/20/17 0936 09/20/17 1032 09/20/17 1130  GLUCAP 226* 238* 204* 213*     Recent Results (from the past 240 hour(s))  MRSA PCR Screening     Status: None   Collection Time: 09/20/17  9:17 AM  Result Value Ref Range Status   MRSA by PCR NEGATIVE NEGATIVE Final    Comment:        The GeneXpert MRSA Assay (FDA approved for NASAL specimens only), is one component of a comprehensive MRSA colonization surveillance program. It is not intended to diagnose MRSA infection nor to guide or monitor treatment for MRSA infections. Performed at [facility], [address], [place], [place] [address]      Current Facility-Administered Medications:  .  0.9 %  sodium chloride infusion, , Intravenous, Continuous, Barbaraann Rondo, MD, Stopped at 09/20/17 6045 .  0.9 %  sodium chloride infusion, , Intravenous, Continuous, Annett Fabian, MD, Last Rate: 150 mL/hr at 09/20/17 1119 .  dextrose 10 % infusion, , Intravenous, Continuous,  Enid Baas, MD, Last Rate: 100 mL/hr at 09/20/17 0903 .  enoxaparin (LOVENOX) injection 40 mg, 40 mg, Subcutaneous, Q24H, Marjie Skiff, Prasanna, MD, 40 mg at 09/20/17 0902 .  fenofibrate tablet 160 mg, 160 mg, Oral, Daily, Marjie Skiff, Prasanna, MD, 160 mg at 09/20/17 0933 .  HYDROmorphone (DILAUDID) injection 1 mg, 1 mg, Intravenous, Q3H PRN, 1 mg at 09/20/17 0900 **OR** HYDROmorphone (DILAUDID) injection 2 mg, 2 mg, Intravenous, Q3H PRN, Marjie Skiff, Prasanna, MD .  MEDLINE mouth rinse, 15 mL, Mouth Rinse, BID, Kalisetti, Radhika, MD .  morphine 2 MG/ML injection, , , ,  .  niacin CR capsule 500 mg, 500 mg, Oral, QHS, Sridharan, Prasanna, MD .  ondansetron (ZOFRAN) injection 4 mg, 4 mg, Intravenous, Q6H PRN, Marjie Skiff, Prasanna, MD, 4 mg at 09/20/17 1027 .  rosuvastatin (CRESTOR) tablet 40 mg, 40 mg, Oral, q1800, Marjie Skiff, Prasanna, MD .   senna-docusate (Senokot-S) tablet 2 tablet, 2 tablet, Oral, BID PRN, Marjie Skiff, Prasanna, MD .  sodium chloride 0.9 % 100 mL with insulin regular (NOVOLIN R,HUMULIN R) 100 Units infusion, , Intravenous, Continuous, Kalisetti, Radhika, MD, Last Rate: 8 mL/hr at 09/20/17 0932  IMAGING    US Abdomen Limited Ruq  Result Date: 09/20/2017 CLINICAL DATA:  Upper abdominal pain. EXAM: ULTRASOUND ABDOMEN LIMITED RIGHT UPPER QUADRANT COMPARISON:  None. FINDINGS: Gallbladder: Physiologically distended. No gallstones or wall thickening visualized. No sonographic Murphy sign noted by sonographer. Common bile duct: Diameter: 2 mm, normal. Liver: No focal lesion identified. Diffusely increased in parenchymal echogenicity. Portal vein is patent on color Doppler imaging with normal direction of blood flow towards the liver. IMPRESSION: 1. Normal sonographic appearance of the gallbladder and biliary tree. No gallstones. 2. Hepatic steatosis. Electronically Signed   By: Rubye Oaks M.D.   On: 09/20/2017 03:23       MAJOR EVENTS/TEST RESULTS: Per HPI  INDWELLING DEVICES:: Nil  MICRO DATA: MRSA PCR  Urine  Blood Resp   ANTIMICROBIALS:  Nil   ASSESSMENT/PLAN   1. Acute Pancreatitis secondary to Hypertriglyceridemia. Gallstones R/O via ultrasound 2. Hypertriglyceridemia  Plan: 1. IVF, pain control, monitor sepsis marker- lactic acid, procalcitonin. 2. CT Scan of Abdomen and Pelvis as patient with significant pain and tenderness. 3. Treat elevated triglycerides with insulin drip 4.  Target Glucose monitoring 5. Will need longterm  Lipid management therapy 6. GI and DVT prophylaxis 7. Monitor Ranson criteria    Family: patient and daughter updated  I have personally obtained a history, examined the patient, evaluated laboratory and independently reviewed  imaging results, formulated the assessment and plan and placed orders.  The Patient requires high complexity decision making for  assessment and support, frequent evaluation and titration of therapies, application of advanced monitoring technologies and extensive interpretation of multiple databases. Critical Care Time devoted to patient care services described in this note is 40 minutes.   Overall, patient is critically ill, prognosis is guarded. Patient at high risk for cardiac arrest and death.   Jackson Latino, M.D

## 2017-09-20 NOTE — Progress Notes (Signed)
CRITICAL VALUE ALERT  Critical Value:  LA 4.4   Date & Time Notied:  09/20/17 @ 1110  Provider Notified: Dr. Peggye Pittichards  Orders Received/Actions taken: No new orders receieved

## 2017-09-20 NOTE — Progress Notes (Signed)
MEDICATION RELATED CONSULT NOTE - INITIAL   Pharmacy Consult for Electrolyte Management Indication: Pancreatitis on Insulin drip  No Known Allergies  Patient Measurements: Height: 5' (152.4 cm) Weight: 150 lb (68 kg) IBW/kg (Calculated) : 45.5 Adjusted Body Weight:    Vital Signs: Temp: 100 F (37.8 C) (04/13 1850) Temp Source: Oral (04/13 1850) BP: 120/70 (04/13 1900) Pulse Rate: 114 (04/13 1900) Intake/Output from previous day: 04/12 0701 - 04/13 0700 In: 1000 [IV Piggyback:1000] Out: -  Intake/Output from this shift: No intake/output data recorded.  Labs: Recent Labs    09/20/17 0201  09/20/17 1258 09/20/17 1612 09/20/17 2043  WBC 16.5*  --   --   --   --   HGB UNABLE TO REPORT DUE TO LIPEMIC INTERFERENCE  --   --   --   --   HCT 37.0  --   --   --   --   PLT 249  --   --   --   --   CREATININE 0.56   < > <0.30* <0.30* 0.31*  MG  --    < > 3.0* 2.5* 2.2  PHOS  --    < > 2.0* 1.7* 2.5  ALBUMIN 3.7  --   --   --   --   PROT 6.7  --   --   --   --   AST 72*  --   --   --   --   ALT 129*  --   --   --   --   ALKPHOS 66  --   --   --   --   BILITOT 0.9  --   --   --   --    < > = values in this interval not displayed.   Estimated Creatinine Clearance: 85.3 mL/min (A) (by C-G formula based on SCr of 0.31 mg/dL (L)).   Microbiology: Recent Results (from the past 720 hour(s))  MRSA PCR Screening     Status: None   Collection Time: 09/20/17  9:17 AM  Result Value Ref Range Status   MRSA by PCR NEGATIVE NEGATIVE Final    Comment:        The GeneXpert MRSA Assay (FDA approved for NASAL specimens only), is one component of a comprehensive MRSA colonization surveillance program. It is not intended to diagnose MRSA infection nor to guide or monitor treatment for MRSA infections. Performed at [facility], [address] [place], [place] [address]     Medical History: History reviewed. No pertinent past medical history.   Assessment: Patient  is a 37yo female admitted for pancreatitis. Triglycerides on admission were elevated > 5000. Patient was initiated on insulin drip and IV fluids. Now patient with electrolyte abnormalities.  K 3.8, Mg 3.0, Phos 2.0, Na 121  Goal of Therapy:  Maintain electrolytes within range  Plan:  Will order NaPhos 10mmol IV once. Will follow up on labs.  04/13 2100 @ K 2.9, Phos 2.5. Phos WNL, Will supplement K w/ KCI 10 mEq IV x 5 and will recheck electrolytes w/ am labs.  Thomasene Rippleavid Lysandra Loughmiller, PharmD, BCPS Clinical Pharmacist 09/20/2017

## 2017-09-20 NOTE — H&P (Signed)
[employer] [facility] [place] [facility]   PATIENT NAME: Claudia Cannon    MR#:  [id number]  DATE OF BIRTH:  07/22/1980  DATE OF ADMISSION:  09/20/2017  PRIMARY CARE PHYSICIAN: [facility]   REQUESTING/REFERRING PHYSICIAN: Myrna BlazerSchaevitz, David Matthew, MD  CHIEF COMPLAINT:   Chief Complaint  Patient presents with  . Abdominal Pain    HISTORY OF PRESENT ILLNESS:  Claudia Cannon  is a 37 y.o. female with a no known past medical history, who presents with epigastric AP. Pt is Spanish-speaking, and speaks minimal [place]. Her daughter is at the bedside, and is able to translate. Patient states she has been experiencing mild postprandial epigastric pain for the past ~1.5wks. She characterizes the discomfort as a dull pressure/ache. She endorses rapid/early satiety, but denies any progression of the pain over the course of the past 1.5wks. She states that last night, however, the pain suddenly increased in severity, and became unbearable. The pain radiates to the lower chest at times, and she states she has had difficulty breathing 2/2 pain. She endorses abdominal distension and gas/discomfort. She endorses loss of appetite, but denies nausea and/or vomiting. She denies hematemesis, melena/hematochezia or urinary symptoms. She states she does not have any known active medical problems, and is not presently taking any medications. She does not drink alcohol. Her last PO intake was at ~1700PM on Friday (09/19/2017). Denies F/C, palpitations, diaphoresis, LH/LOC. She is in moderate abdominal discomfort at the time of my examination, but is otherwise healthy appearing.   PAST MEDICAL HISTORY:  History reviewed. No pertinent past medical history.  PAST SURGICAL HISTORY:  History reviewed. No pertinent surgical history.  SOCIAL HISTORY:   Social History   Tobacco Use  . Smoking status: Never Smoker  . Smokeless tobacco: Never Used    Substance Use Topics  . Alcohol use: No    Frequency: Never   Denies EtOH, tobacco, illicit drug use. Denies recent travel. Denies sick contacts. Lives in trailer with 4 children (3 daughters, 1 son). Unemployed, used to work [employer] representative[employer]. Originally from [place], [place].  FAMILY HISTORY:   Family History  Problem Relation Age of Onset  . Diabetes Mother   . Hypertension Mother   . Hyperlipidemia Mother   . Chronic Renal Failure Mother    Mother deceased in early/mid-60s, (+) Hx DM, HTN, HLD, CKD. Father healthy, murdered. 2 brothers + 1 sister, alive & healthy. 3 daughters + 1 son, alive & healthy.  DRUG ALLERGIES:  No Known Allergies  REVIEW OF SYSTEMS:   Review of Systems  Constitutional: Positive for malaise/fatigue. Negative for chills, diaphoresis, fever and weight loss.  HENT: Negative for congestion, hearing loss, sore throat and tinnitus.   Eyes: Negative for blurred vision, double vision and redness.  Respiratory: Positive for shortness of breath (+) SOB associated w/ AP/CP per HPI. Negative for cough, hemoptysis, sputum production and wheezing.   Cardiovascular: Positive for chest pain (+) AP radiating to chest per HPI. Negative for palpitations, orthopnea, claudication, leg swelling and PND.  Gastrointestinal: Positive for abdominal pain. Negative for blood in stool, constipation, diarrhea, heartburn, melena, nausea and vomiting.  Genitourinary: Negative for dysuria, frequency, hematuria and urgency.  Musculoskeletal: Negative for back pain, joint pain and neck pain.  Skin: Negative for itching and rash.  Neurological: Positive for weakness (+) generalized weakness. Negative for dizziness, tingling, tremors, focal weakness, loss of consciousness and headaches.    MEDICATIONS AT HOME:  Prior to Admission medications   Medication Sig Start Date End Date Taking? Authorizing Provider  valACYclovir (VALTREX) 1000 MG tablet Take 1 tablet (1,000 mg  total) by mouth 3 (three) times daily. Patient not taking: Reported on 09/20/2017 08/26/17   Cuthriell, Delorise Royals, PA-C      VITAL SIGNS:  Blood pressure (!) 104/51, pulse 98, temperature 98 F (36.7 C), temperature source Oral, resp. rate 18, height 5' (1.524 m), weight 68 kg (150 lb), SpO2 98 %.  PHYSICAL EXAMINATION:  Physical Exam  Constitutional: She is oriented to person, place, and time. She appears well-developed and well-nourished.  Non-toxic appearance. She does not appear ill. No distress.  HENT:  Head: Normocephalic and atraumatic.  Eyes: Pupils are equal, round, and reactive to light. EOM are normal. No scleral icterus.  Cardiovascular: Normal rate, regular rhythm and normal heart sounds. Exam reveals no gallop and no friction rub.  No murmur heard. Pulmonary/Chest: Effort normal and breath sounds normal. No stridor. No respiratory distress. She has no wheezes. She has no rhonchi. She has no rales. She exhibits no tenderness.  Abdominal: Soft. Normal appearance. She exhibits no distension, no fluid wave, no ascites and no mass. Bowel sounds are decreased. There is tenderness in the epigastric area. There is no rigidity, no rebound, no guarding and no CVA tenderness.  Musculoskeletal: Normal range of motion. She exhibits no edema.  Neurological: She is alert and oriented to person, place, and time.  Skin: Skin is warm and dry. No rash noted. No erythema.  Psychiatric: She has a normal mood and affect. Her behavior is normal. Thought content normal.    GENERAL:  37 y.o.-year-old patient lying in the bed with no acute distress.  EYES: Pupils equal, round, reactive to light and accommodation. No scleral icterus. Extraocular muscles intact.  HEENT: Head atraumatic, normocephalic. Oropharynx and nasopharynx clear.  NECK:  Supple, no jugular venous distention. No thyroid enlargement, no tenderness.  LUNGS: Normal breath sounds bilaterally, no wheezing, rales,rhonchi or crepitation.  No use of accessory muscles of respiration.  CARDIOVASCULAR: S1, S2 normal. No murmurs, rubs, or gallops.  ABDOMEN: Soft, nontender, nondistended. Bowel sounds present. No organomegaly or mass.  EXTREMITIES: No pedal edema, cyanosis, or clubbing.  NEUROLOGIC: Cranial nerves II through XII are intact. Muscle strength 5/5 in all extremities. Sensation intact. Gait not checked.  PSYCHIATRIC: The patient is alert and oriented x 3.  SKIN: No obvious rash, lesion, or ulcer.   LABORATORY PANEL:   CBC Recent Labs  Lab 09/20/17 0201  WBC 16.5*  HGB UNABLE TO REPORT DUE TO LIPEMIC INTERFERENCE  HCT 37.0  PLT 249   ------------------------------------------------------------------------------------------------------------------  Chemistries  Recent Labs  Lab 09/20/17 0201  NA 135  K 3.5  CL 100*  CO2 25  GLUCOSE 197*  BUN 14  CREATININE 0.56  CALCIUM 9.1  AST 72*  ALT 129*  ALKPHOS 66  BILITOT 0.9   ------------------------------------------------------------------------------------------------------------------  Cardiac Enzymes Recent Labs  Lab 09/20/17 0201  TROPONINI <0.03   ------------------------------------------------------------------------------------------------------------------  RADIOLOGY:  US Abdomen Limited Ruq  Result Date: 09/20/2017 CLINICAL DATA:  Upper abdominal pain. EXAM: ULTRASOUND ABDOMEN LIMITED RIGHT UPPER QUADRANT COMPARISON:  None. FINDINGS: Gallbladder: Physiologically distended. No gallstones or wall thickening visualized. No sonographic Murphy sign noted by sonographer. Common bile duct: Diameter: 2 mm, normal. Liver: No focal lesion identified. Diffusely increased in parenchymal echogenicity. Portal vein is patent on color Doppler imaging with normal direction of blood flow towards the liver. IMPRESSION: 1. Normal sonographic appearance of  the gallbladder and biliary tree. No gallstones. 2. Hepatic steatosis. Electronically Signed   By: Rubye Oaks M.D.   On: 09/20/2017 03:23      IMPRESSION AND PLAN:   A/P: 28F hypertriglyceridemic pancreatitis.  1.) Hypertriglyceridemic pancreatitis: Pt p/w epigastric AP x1.5wk, severe x1d, prompting hospitalization. (-) N/V/D. No PMHx of DM, denies EtOH/drug use. Lipase 620. RUQ U/S (-) gallstones. TAG > 5000. Pt w/ hypertriglyceridemic pancreatitis. Glucose 197, pt possibly w/ undiagnosed DM as etiology for hypertriglyceridemia (vs. essential/familial etiology). Admit to ICU. Insulin gtt. Per my review of the contemporaneous literature, insulin gtt non-inferior to apheresis for management of hypertriglyceridemia. Monitor electrolytes and replete as appropriate. IVF, pain ctrl, antiemetics. NPO until TAG decreased (goal < 500). Crestor 40, Niacin 500, Fenofibrate 160. F/up labwork.  2.) Hyperglycemia: Glucose 197. HbA1c pending.  3.) FEN/GI: NPO, IVF.  4.) DVT PPx: Lovenox 40mg  SQ qD.  5.) Code status: Full code.  6.) Disposition: Admission, pt expected to stay > 2 midnights.  All the records are reviewed and case discussed with ED provider. Management plans discussed with the patient, family and they are in agreement.  CODE STATUS: Full code.  TOTAL TIME TAKING CARE OF THIS PATIENT: 90 minutes.    Barbaraann Rondo M.D on 09/20/2017 at 5:22 AM  Between 7am to 6pm - Pager - [phone]  After 6pm go to [email or web address] - Scientist, research (life sciences) [facility]  Office  [phone]  CC: Primary care physician; Center, [facility]   Note: This dictation was prepared with Dragon dictation along with smaller phrase technology. Any transcriptional errors that result from this process are unintentional.

## 2017-09-20 NOTE — Progress Notes (Signed)
CRITICAL VALUE ALERT  Critical Value:  LA 4.4  Date & Time Notied:  09/20/2017 @ 1400  Provider Notified: Dr. Peggye Pittichards  Orders Received/Actions taken: No new orders received  I also discussed results of BMP with Dr. Peggye Pittichards, no new orders received.

## 2017-09-20 NOTE — ED Triage Notes (Signed)
Pt reports that she has been having upper abdominal pain since last week which comes and goes. Pt states that she has had pain since 2100 last evening and feels very full. Pt c/o upper mid abdominal pain which she feels in her back. Pt is in visible pain at this time.

## 2017-09-20 NOTE — ED Provider Notes (Signed)
[facility] Emergency Department Provider Note  ____________________________________________   First MD Initiated Contact with Patient 09/20/17 0220     (approximate)  I have reviewed the triage vital signs and the nursing notes.   HISTORY  Chief Complaint Abdominal Pain  HPI Claudia Cannon is a 37 y.o. female without any chronic medical conditions who is presenting to the emergency department today with epigastric abdominal pain that is been ongoing for the past week.  Says the pain is severe and feels like a pressure type pain which radiates through to her back.  She denies any nausea, vomiting or diarrhea.  However, says the pain worsens with eating.  Denies any burning with urination.  No history of abdominal surgeries.  Does not report any vaginal bleeding or discharge.  Denies drinking alcohol.   History reviewed. No pertinent past medical history.  There are no active problems to display for this patient.   History reviewed. No pertinent surgical history.  Prior to Admission medications   Medication Sig Start Date End Date Taking? Authorizing Provider  valACYclovir (VALTREX) 1000 MG tablet Take 1 tablet (1,000 mg total) by mouth 3 (three) times daily. 08/26/17   Cuthriell, Delorise RoyalsJonathan D, PA-C    Allergies Patient has no known allergies.  No family history on file.  Social History Social History   Tobacco Use  . Smoking status: Never Smoker  . Smokeless tobacco: Never Used  Substance Use Topics  . Alcohol use: No    Frequency: Never  . Drug use: No    Review of Systems  Constitutional: No fever/chills Eyes: No visual changes. ENT: No sore throat. Cardiovascular: Denies chest pain. Respiratory: Denies shortness of breath. Gastrointestinal:   No nausea, no vomiting.  No diarrhea.  No constipation. Genitourinary: Negative for dysuria. Musculoskeletal: As above Skin: Negative for rash. Neurological: Negative for headaches,  focal weakness or numbness.   ____________________________________________   PHYSICAL EXAM:  VITAL SIGNS: ED Triage Vitals  Enc Vitals Group     BP 09/20/17 0151 (!) 104/51     Pulse Rate 09/20/17 0151 98     Resp 09/20/17 0151 18     Temp 09/20/17 0151 98 F (36.7 C)     Temp Source 09/20/17 0151 Oral     SpO2 09/20/17 0151 98 %     Weight 09/20/17 0152 150 lb (68 kg)     Height 09/20/17 0152 5' (1.524 m)     Head Circumference --      Peak Flow --      Pain Score 09/20/17 0152 8     Pain Loc --      Pain Edu? --      Excl. in GC? --     Constitutional: Alert and oriented.  Appears uncomfortable.  Holding the abdomen. Eyes: Conjunctivae are normal.  Head: Atraumatic. Nose: No congestion/rhinnorhea. Mouth/Throat: Mucous membranes are moist.  Neck: No stridor.   Cardiovascular: Normal rate, regular rhythm. Grossly normal heart sounds.   Respiratory: Normal respiratory effort.  No retractions. Lungs CTAB. Gastrointestinal: Soft moderate to severe tenderness across the upper abdomen without rigidity. No distention. No CVA tenderness. Musculoskeletal: No lower extremity tenderness nor edema.  No joint effusions. Neurologic:  Normal speech and language. No gross focal neurologic deficits are appreciated. Skin:  Skin is warm, dry and intact. No rash noted. Psychiatric: Mood and affect are normal. Speech and behavior are normal.  ____________________________________________   LABS (all labs ordered are listed, but only abnormal results  are displayed)  Labs Reviewed  LIPASE, BLOOD - Abnormal; Notable for the following components:      Result Value   Lipase 620 (*)    All other components within normal limits  COMPREHENSIVE METABOLIC PANEL - Abnormal; Notable for the following components:   Chloride 100 (*)    Glucose, Bld 197 (*)    AST 72 (*)    ALT 129 (*)    All other components within normal limits  CBC - Abnormal; Notable for the following components:   WBC  16.5 (*)    All other components within normal limits  URINALYSIS, COMPLETE (UACMP) WITH MICROSCOPIC - Abnormal; Notable for the following components:   Color, Urine YELLOW (*)    APPearance HAZY (*)    Ketones, ur 20 (*)    Leukocytes, UA TRACE (*)    Bacteria, UA RARE (*)    Squamous Epithelial / LPF 6-30 (*)    All other components within normal limits  TROPONIN I  TRIGLYCERIDES  POC URINE PREG, ED  POCT PREGNANCY, URINE   ____________________________________________  EKG  ED ECG REPORT I, Arelia Longest, the attending physician, personally viewed and interpreted this ECG.   Date: 09/20/2017  EKG Time: 0159  Rate: 99  Rhythm: normal sinus rhythm  Axis: Normal  Intervals:none  ST&T Change: Concave elevation of 1 mm in V3 and V4 without reciprocal depression.  However, there are T wave inversions in 3 and aVF. No previous for comparison. ____________________________________________  RADIOLOGY  No acute finding on the gallbladder ultrasound ____________________________________________   PROCEDURES  Procedure(s) performed:   Procedures  Critical Care performed:   ____________________________________________   INITIAL IMPRESSION / ASSESSMENT AND PLAN / ED COURSE  Pertinent labs & imaging results that were available during my care of the patient were reviewed by me and considered in my medical decision making (see chart for details).  Differential diagnosis includes, but is not limited to, biliary disease (biliary colic, acute cholecystitis, cholangitis, choledocholithiasis, etc), intrathoracic causes for epigastric abdominal pain including ACS, gastritis, duodenitis, pancreatitis, small bowel or large bowel obstruction, abdominal aortic aneurysm, hernia, and gastritis. As part of my medical decision making, I reviewed the following data within the electronic MEDICAL RECORD NUMBER Notes from prior ED visits  ----------------------------------------- 4:03 AM on  09/20/2017 -----------------------------------------  Patient with pancreatitis, likely from elevated triglycerides.  Patient to be admitted to the hospital.  Pain now minimal after IV morphine.  Signed out to Dr. Stacie Acres.  Patient aware of the diagnosis as well as treatment plan willing to comply. ____________________________________________   FINAL CLINICAL IMPRESSION(S) / ED DIAGNOSES  Hyperlipidemia.  Pancreatitis.    NEW MEDICATIONS STARTED DURING THIS VISIT:  New Prescriptions   No medications on file     Note:  This document was prepared using Dragon voice recognition software and may include unintentional dictation errors.     Myrna Blazer, MD 09/20/17 [phone]

## 2017-09-20 NOTE — Progress Notes (Signed)
Patient admitted this morning by my colleague Dr. Marjie SkiffSridharan. Patient with no past medical history, comes in with abdominal pain and noted to have acute pancreatitis, likely triggered by hypertriglyceridemia. -Admitted, IV fluids, agree with n.p.o. status.  Admitted to ICU/stepdown for insulin drip to get the triglycerides down. -Continue insulin drip until triglycerides are less than thousand.  D10 or D5 drip to support her sugars. -Discussed with patient and her daughter at bedside.  Intensivist to follow along while in ICU

## 2017-09-20 NOTE — Progress Notes (Signed)
Pt's CBG is trending down. Most recent CBG was 164. In addition, pt's HR is sustaining in the 120s. Dr. Peggye Pittichards notified of the above and she stated to check CBGs Q1 hour, but to continue her D10 at current rate and to monitor HR, but no additional intervention to treat her HR is needed at this time.

## 2017-09-20 NOTE — ED Notes (Signed)
Patient's family member out of room to say that patient's pain has returned. This RN went in to speak with patient about her pain and she points to the epigastric area. Received verbal order from MD for additional pain medication.

## 2017-09-20 NOTE — ED Notes (Signed)
ED Provider at bedside. 

## 2017-09-20 NOTE — Progress Notes (Signed)
   09/20/17 2100  Vitals  Temp (!) 100.8 F (38.2 C)  BP 131/75  MAP (mmHg) 90  Pulse Rate (!) 121  ECG Heart Rate (!) 122  Resp (!) 23  Oxygen Therapy  SpO2 96 %   M.T., NP alerted-NP [facility]. Will continue to monitor pt.  closely

## 2017-09-20 NOTE — Progress Notes (Signed)
Pt's abdominal pain is 10/10. She is not due for pain medication at this time. Dr. Peggye Pittichards notified and stated to give 2mg  of IV morphine.

## 2017-09-20 NOTE — Progress Notes (Signed)
Pt's temperature has increased to 100.0 orally. Dr. Peggye Pittichards notified and orders to obtain urine culture and blood cultures if pt's temperature spikes to be >/=100.4. PRN rectal tylenol for fever also added per Dr. Peggye Pittichards.

## 2017-09-20 NOTE — Progress Notes (Signed)
   09/20/17 1445  Clinical Encounter Type  Visited With Patient  Visit Type Initial  Referral From Nurse  Consult/Referral To Chaplain  Spiritual Encounters  Spiritual Needs Brochure   CH received and OR to educate PT on an AD. CH left AD forms with PT to be completed if she desires.

## 2017-09-20 NOTE — ED Notes (Signed)
Patient @ US

## 2017-09-20 NOTE — Progress Notes (Addendum)
Admitting MD ordered potassium runs x 2 as well as x6. Pharmacy called to determine how many runs pt should be receiving. [facility] appears [facility] pt should receive 2 runs of IV potassium if serum K levels are 3.5-5.0. Last serum K level was drawn at 0200 and was resulted as 3.5. Dr. Nemiah CommanderKalisetti notified and she stated to administer 2 runs of IV K. Orders for Q 4 BMP also in place, thus will continue to follow serum K levels.

## 2017-09-21 ENCOUNTER — Inpatient Hospital Stay: Payer: Medicaid Other

## 2017-09-21 DIAGNOSIS — E876 Hypokalemia: Secondary | ICD-10-CM

## 2017-09-21 DIAGNOSIS — K8581 Other acute pancreatitis with uninfected necrosis: Secondary | ICD-10-CM

## 2017-09-21 LAB — GLUCOSE, CAPILLARY
GLUCOSE-CAPILLARY: 199 mg/dL — AB (ref 65–99)
GLUCOSE-CAPILLARY: 214 mg/dL — AB (ref 65–99)
GLUCOSE-CAPILLARY: 173 mg/dL — AB (ref 65–99)
GLUCOSE-CAPILLARY: 180 mg/dL — AB (ref 65–99)
GLUCOSE-CAPILLARY: 176 mg/dL — AB (ref 65–99)
GLUCOSE-CAPILLARY: 158 mg/dL — AB (ref 65–99)
GLUCOSE-CAPILLARY: 168 mg/dL — AB (ref 65–99)
GLUCOSE-CAPILLARY: 122 mg/dL — AB (ref 65–99)
GLUCOSE-CAPILLARY: 144 mg/dL — AB (ref 65–99)
GLUCOSE-CAPILLARY: 105 mg/dL — AB (ref 65–99)
Glucose-Capillary: 127 mg/dL — ABNORMAL HIGH (ref 65–99)
Glucose-Capillary: 133 mg/dL — ABNORMAL HIGH (ref 65–99)
Glucose-Capillary: 164 mg/dL — ABNORMAL HIGH (ref 65–99)
Glucose-Capillary: 165 mg/dL — ABNORMAL HIGH (ref 65–99)
Glucose-Capillary: 167 mg/dL — ABNORMAL HIGH (ref 65–99)
Glucose-Capillary: 180 mg/dL — ABNORMAL HIGH (ref 65–99)
Glucose-Capillary: 182 mg/dL — ABNORMAL HIGH (ref 65–99)
Glucose-Capillary: 184 mg/dL — ABNORMAL HIGH (ref 65–99)
Glucose-Capillary: 193 mg/dL — ABNORMAL HIGH (ref 65–99)

## 2017-09-21 LAB — COMPREHENSIVE METABOLIC PANEL
ALBUMIN: 3 g/dL — AB (ref 3.5–5.0)
ALBUMIN: 2.8 g/dL — AB (ref 3.5–5.0)
ALT: 65 U/L — AB (ref 14–54)
ALT: 53 U/L (ref 14–54)
AST: 42 U/L — AB (ref 15–41)
AST: 32 U/L (ref 15–41)
Alkaline Phosphatase: 60 U/L (ref 38–126)
Alkaline Phosphatase: 60 U/L (ref 38–126)
Anion gap: 15 (ref 5–15)
Anion gap: 8 (ref 5–15)
BILIRUBIN TOTAL: 1.6 mg/dL — AB (ref 0.3–1.2)
BUN: <5 mg/dL — ABNORMAL LOW (ref 6–20)
CHLORIDE: 99 mmol/L — AB (ref 101–111)
CO2: 16 mmol/L — AB (ref 22–32)
CO2: 24 mmol/L (ref 22–32)
CREATININE: 0.41 mg/dL — AB (ref 0.44–1.00)
CREATININE: 0.49 mg/dL (ref 0.44–1.00)
Calcium: 6.2 mg/dL — CL (ref 8.9–10.3)
Calcium: 6.2 mg/dL — CL (ref 8.9–10.3)
Chloride: 98 mmol/L — ABNORMAL LOW (ref 101–111)
GFR calc Af Amer: >60 mL/min (ref >60)
GFR calc Af Amer: >60 mL/min (ref >60)
GFR calc non Af Amer: >60 mL/min (ref >60)
GLUCOSE: 179 mg/dL — AB (ref 65–99)
GLUCOSE: 158 mg/dL — AB (ref 65–99)
POTASSIUM: 3.5 mmol/L (ref 3.5–5.1)
Potassium: 3.6 mmol/L (ref 3.5–5.1)
SODIUM: 129 mmol/L — AB (ref 135–145)
Sodium: 131 mmol/L — ABNORMAL LOW (ref 135–145)
Total Bilirubin: 2.5 mg/dL — ABNORMAL HIGH (ref 0.3–1.2)
Total Protein: 5.8 g/dL — ABNORMAL LOW (ref 6.5–8.1)
Total Protein: 6 g/dL — ABNORMAL LOW (ref 6.5–8.1)

## 2017-09-21 LAB — TRIGLYCERIDES
TRIGLYCERIDES: 2558 mg/dL — AB (ref <150)
TRIGLYCERIDES: 1217 mg/dL — AB (ref <150)
Triglycerides: 387 mg/dL — ABNORMAL HIGH (ref <150)

## 2017-09-21 LAB — CBC
HCT: 36.8 % (ref 35.0–47.0)
HEMOGLOBIN: 11.7 g/dL — AB (ref 12.0–16.0)
MCH: 28.5 pg (ref 26.0–34.0)
MCHC: 31.8 g/dL — AB (ref 32.0–36.0)
MCV: 89.5 fL (ref 80.0–100.0)
PLATELETS: 223 10*3/uL (ref 150–440)
RBC: 4.11 MIL/uL (ref 3.80–5.20)
RDW: 14.8 % — AB (ref 11.5–14.5)
WBC: 12 10*3/uL — ABNORMAL HIGH (ref 3.6–11.0)

## 2017-09-21 LAB — HEMOGLOBIN A1C
Hgb A1c MFr Bld: 7.2 % — ABNORMAL HIGH (ref 4.8–5.6)
MEAN PLASMA GLUCOSE: 159.94 mg/dL

## 2017-09-21 LAB — LIPASE, BLOOD: LIPASE: 88 U/L — AB (ref 11–51)

## 2017-09-21 LAB — PHOSPHORUS
PHOSPHORUS: 1.4 mg/dL — AB (ref 2.5–4.6)
Phosphorus: 2.3 mg/dL — ABNORMAL LOW (ref 2.5–4.6)
Phosphorus: 1.8 mg/dL — ABNORMAL LOW (ref 2.5–4.6)

## 2017-09-21 LAB — BASIC METABOLIC PANEL
ANION GAP: 8 (ref 5–15)
CALCIUM: 6.1 mg/dL — AB (ref 8.9–10.3)
CO2: 24 mmol/L (ref 22–32)
CREATININE: 0.41 mg/dL — AB (ref 0.44–1.00)
Chloride: 99 mmol/L — ABNORMAL LOW (ref 101–111)
GFR calc Af Amer: >60 mL/min (ref >60)
GFR calc non Af Amer: >60 mL/min (ref >60)
Glucose, Bld: 145 mg/dL — ABNORMAL HIGH (ref 65–99)
POTASSIUM: 3.4 mmol/L — AB (ref 3.5–5.1)
SODIUM: 131 mmol/L — AB (ref 135–145)

## 2017-09-21 LAB — MAGNESIUM
MAGNESIUM: 1.4 mg/dL — AB (ref 1.7–2.4)
MAGNESIUM: 1.4 mg/dL — AB (ref 1.7–2.4)
Magnesium: 2.4 mg/dL (ref 1.7–2.4)

## 2017-09-21 LAB — LACTIC ACID, PLASMA: LACTIC ACID, VENOUS: 2.5 mmol/L — AB (ref 0.5–1.9)

## 2017-09-21 MED ORDER — POTASSIUM PHOSPHATES 15 MMOLE/5ML IV SOLN
45.0000 mmol | Freq: Once | INTRAVENOUS | Status: DC
  Filled 2017-09-21: qty 15

## 2017-09-21 MED ORDER — POTASSIUM PHOSPHATES 15 MMOLE/5ML IV SOLN
30.0000 mmol | Freq: Once | INTRAVENOUS | Status: AC
  Administered 2017-09-21: 30 mmol via INTRAVENOUS
  Filled 2017-09-21: qty 10

## 2017-09-21 MED ORDER — SODIUM CHLORIDE 4 MEQ/ML IV SOLN
INTRAVENOUS | Status: DC
  Administered 2017-09-21: 09:00:00 via INTRAVENOUS
  Filled 2017-09-21 (×5): qty 950

## 2017-09-21 MED ORDER — DEXTROSE 5 % IV SOLN
10.0000 mmol | Freq: Once | INTRAVENOUS | Status: AC
  Administered 2017-09-21: 10 mmol via INTRAVENOUS
  Filled 2017-09-21: qty 3.33

## 2017-09-21 MED ORDER — MAGNESIUM SULFATE 4 GM/100ML IV SOLN
4.0000 g | Freq: Once | INTRAVENOUS | Status: AC
  Administered 2017-09-21: 4 g via INTRAVENOUS
  Filled 2017-09-21: qty 100

## 2017-09-21 NOTE — Progress Notes (Signed)
MEDICATION RELATED CONSULT NOTE - INITIAL   Pharmacy Consult for Electrolyte Management Indication: Pancreatitis on Insulin drip  No Known Allergies  Patient Measurements: Height: 5' (152.4 cm) Weight: 150 lb (68 kg) IBW/kg (Calculated) : 45.5 Adjusted Body Weight:    Vital Signs: Temp: 100.1 F (37.8 C) (04/14 1838) Temp Source: Oral (04/14 1838) BP: 108/59 (04/14 1200) Pulse Rate: 114 (04/14 1300) Intake/Output from previous day: 04/13 0701 - 04/14 0700 In: 7247.3 [I.V.:6394; IV Piggyback:853.3] Out: 3100 [Urine:3100] Intake/Output from this shift: No intake/output data recorded.  Labs: Recent Labs    09/20/17 0201  09/20/17 2043 09/21/17 0400 09/21/17 0402 09/21/17 0430 09/21/17 0739 09/21/17 1845 09/21/17 2105  WBC 16.5*  --  15.9*  --   --  12.0*  --   --   --   HGB UNABLE TO REPORT DUE TO LIPEMIC INTERFERENCE  --  12.9  --   --  11.7*  --   --   --   HCT 37.0  --  37.0  --   --  36.8  --   --   --   PLT 249  --  216  --   --  223  --   --   --   CREATININE 0.56   < > 0.31*  --  0.41*  --   --  0.49 0.41*  MG  --    < > 2.2 1.4*  --   --  1.4*  --  2.4  PHOS  --    < > 2.5 2.3*  --   --  1.8*  --  1.4*  ALBUMIN 3.7  --   --   --  3.0*  --   --  2.8*  --   PROT 6.7  --   --   --  5.8*  --   --  6.0*  --   AST 72*  --   --   --  42*  --   --  32  --   ALT 129*  --   --   --  65*  --   --  53  --   ALKPHOS 66  --   --   --  60  --   --  60  --   BILITOT 0.9  --   --   --  2.5*  --   --  1.6*  --    < > = values in this interval not displayed.   Estimated Creatinine Clearance: 85.3 mL/min (A) (by C-G formula based on SCr of 0.41 mg/dL (L)).   Assessment: Patient is a 37yo female admitted for pancreatitis. Triglycerides on admission were elevated > 5000. Patient was initiated on insulin drip and IV fluids. Now patient with electrolyte abnormalities.  K: 3.6, Mg 1.4, Phos 1.8, Na: 129  Patient continues on insulin drip at 448ml/hr with D10 and normal saline  running. Triglycerides trending down.   Goal of Therapy:  K 4.5 - 5.3, Mg > 2.0 while on insulin drip  Plan:  Will consolidate IV fluids to D10/NS with 40mEq of KCl at 16400ml/hr.  Ordered KPhos 10mmol x 1, Magnesium 4gm x 1  Recheck potassium this afternoon, and repeat BMP/Mg/Phos at 1800. Would like to keep potassium > 4.5 while on insulin drip.  04/14 @ 2100 K 3.4, Phos 1.4, Ca 6.1, CCa (w/ albumin from 2 hours prior = 7.01). D10NS + 40 K running @ 100 ml/hr for K of 96 mEq/24 hours  Will supplement K and Phos w/ additional KPhos 30 mmol IV x 1 over 6 hours and will recheck electrolytes w/ am labs. Per intensivist, CCa ~ 7 will not replace Ca at the moment, but will monitor w/ am labs.  Thomasene Ripple, PharmD, BCPS Clinical Pharmacist 09/21/2017

## 2017-09-21 NOTE — Plan of Care (Signed)
Pt. A&O x 4, insulin gtt remains at 8 units/ h, VSS O/N, pt. Remains on RA. Utilized BSC due to pt.'s increased abdominal pain with any activity. Dilaudid 2 mg controlled pain reasonably well O/N without any complications. Pt. Spiked temp of 100.8, BC & UC done and pt. Received tylenol suppository which was effective. UOP O/N > 2 L. No care concerns at this time Report given to Cyra

## 2017-09-21 NOTE — Progress Notes (Signed)
[facility] [place] [facility]   PATIENT NAME: Claudia NimrodMargarita Alegria Cannon    MR#:  [id number]  DATE OF BIRTH:  April 03, 1981  SUBJECTIVE:  CHIEF COMPLAINT:   Chief Complaint  Patient presents with  . Abdominal Pain   -Admitted with acute pancreatitis, still with abdominal pain. -Triglycerides are improving.  Remains on insulin drip  REVIEW OF SYSTEMS:  Review of Systems  Constitutional: Negative for chills, fever and malaise/fatigue.  HENT: Negative for congestion, ear discharge, hearing loss and nosebleeds.   Eyes: Negative for blurred vision and double vision.  Respiratory: Negative for cough, shortness of breath and wheezing.   Cardiovascular: Negative for chest pain and palpitations.  Gastrointestinal: Positive for abdominal pain, nausea and vomiting. Negative for constipation and diarrhea.  Genitourinary: Negative for dysuria.  Musculoskeletal: Negative for myalgias.  Neurological: Negative for dizziness, focal weakness, seizures, weakness and headaches.  Psychiatric/Behavioral: Negative for depression.    DRUG ALLERGIES:  No Known Allergies  VITALS:  Blood pressure 114/67, pulse (!) 127, temperature (!) 100.8 F (38.2 C), resp. rate 18, height 5' (1.524 m), weight 68 kg (150 lb), SpO2 93 %.  PHYSICAL EXAMINATION:  Physical Exam  GENERAL:  37 y.o.-year-old patient lying in the bed with no acute distress.  EYES: Pupils equal, round, reactive to light and accommodation. No scleral icterus. Extraocular muscles intact.  HEENT: Head atraumatic, normocephalic. Oropharynx and nasopharynx clear.  NECK:  Supple, no jugular venous distention. No thyroid enlargement, no tenderness.  LUNGS: Normal breath sounds bilaterally, no wheezing, rales,rhonchi or crepitation. No use of accessory muscles of respiration.  CARDIOVASCULAR: S1, S2 normal. No murmurs, rubs, or gallops.  ABDOMEN: Soft, tender in the left upper quadrant with voluntary guarding,  nondistended. Bowel sounds present. No organomegaly or mass.  EXTREMITIES: No pedal edema, cyanosis, or clubbing.  NEUROLOGIC: Cranial nerves II through XII are intact. Muscle strength 5/5 in all extremities. Sensation intact. Gait not checked.  PSYCHIATRIC: The patient is alert and oriented x 3.  SKIN: No obvious rash, lesion, or ulcer.    LABORATORY PANEL:   CBC Recent Labs  Lab 09/21/17 0430  WBC 12.0*  HGB 11.7*  HCT 36.8  PLT 223   ------------------------------------------------------------------------------------------------------------------  Chemistries  Recent Labs  Lab 09/21/17 0402 09/21/17 0739  NA 129*  --   K 3.6  --   CL 98*  --   CO2 16*  --   GLUCOSE 179*  --   BUN <5*  --   CREATININE 0.41*  --   CALCIUM 6.2*  --   MG  --  1.4*  AST 42*  --   ALT 65*  --   ALKPHOS 60  --   BILITOT 2.5*  --    ------------------------------------------------------------------------------------------------------------------  Cardiac Enzymes Recent Labs  Lab 09/20/17 0201  TROPONINI <0.03   ------------------------------------------------------------------------------------------------------------------  RADIOLOGY:  Dg Abd 1 View  Result Date: 09/21/2017 CLINICAL DATA:  Abdominal pain EXAM: ABDOMEN - 1 VIEW COMPARISON:  None. FINDINGS: Bowel gas pattern is nonobstructive. Mass effect is seen on the upper portion of the stomach, likely related to the fluid/edema seen about the pancreas on yesterday's CT, likely with associated thickening of the walls of the stomach. No evidence of free intraperitoneal air. No evidence of renal or ureteral calculi. Osseous structures are unremarkable. IMPRESSION: 1. Mass effect on the superior/posterior margin of the stomach, almost certainly related to the acute pancreatitis with associated peripancreatic fluid/edema demonstrated on yesterday's CT, likely with associated reactive thickening of the  walls of the stomach. 2.  Nonobstructive bowel gas pattern. Electronically Signed   By: Bary Richard M.D.   On: 09/21/2017 08:05   Ct Abdomen Pelvis W Contrast  Result Date: 09/20/2017 CLINICAL DATA:  Intermittent upper abdominal pain since last week. EXAM: CT ABDOMEN AND PELVIS WITH CONTRAST TECHNIQUE: Multidetector CT imaging of the abdomen and pelvis was performed using the standard protocol following bolus administration of intravenous contrast. CONTRAST:  ISOVUE-300 IOPAMIDOL (ISOVUE-300) INJECTION 61% COMPARISON:  None. FINDINGS: Lower chest: Mild atelectasis at the RIGHT lung base. Hepatobiliary: Fatty infiltration of the liver, with focal sparing adjacent to the gallbladder fossa. No suspicious mass or lesion within the liver. Gallbladder is unremarkable. No bile duct dilatation. Pancreas: Ill-defined fluid along the body and tail of the pancreas, consistent with acute pancreatitis. No peripancreatic hemorrhage seen. No parenchymal mass or hemorrhage seen. No pancreatic duct dilatation. Spleen: Normal in size without focal abnormality. Adrenals/Urinary Tract: Adrenal glands are unremarkable. Kidneys are normal, without renal calculi, focal lesion, or hydronephrosis. Bladder is unremarkable. Stomach/Bowel: No dilated large or small bowel loops. No bowel wall thickening or evidence of bowel wall inflammation. Appendix is normal. Stomach is unremarkable, perhaps mild reactive thickening of the walls of the gastric fundus. Vascular/Lymphatic: No significant vascular findings are present. No enlarged abdominal or pelvic lymph nodes. Reproductive: Incidental note of a small crenated cyst in the RIGHT ovary. No significant findings within either adnexal region. Uterus is unremarkable. Other: Trace free fluid in the RIGHT lower quadrant and pelvis, likely related to the aforementioned pancreatitis. No abscess collection or free intraperitoneal air. Musculoskeletal: No acute or significant osseous findings. IMPRESSION: 1. Acute  pancreatitis. Moderate amount of ill-defined fluid surrounding the body and tail of the pancreas. No pseudocyst formation. No peripancreatic hemorrhage. No pancreatic mass or pancreatic ductal dilatation seen. Probable reactive thickening of the walls of the adjacent stomach fundus. 2. Fatty infiltration of the liver. 3. Additional incidental findings detailed above. Electronically Signed   By: Bary Richard M.D.   On: 09/20/2017 12:37   US Abdomen Limited Ruq  Result Date: 09/20/2017 CLINICAL DATA:  Upper abdominal pain. EXAM: ULTRASOUND ABDOMEN LIMITED RIGHT UPPER QUADRANT COMPARISON:  None. FINDINGS: Gallbladder: Physiologically distended. No gallstones or wall thickening visualized. No sonographic Murphy sign noted by sonographer. Common bile duct: Diameter: 2 mm, normal. Liver: No focal lesion identified. Diffusely increased in parenchymal echogenicity. Portal vein is patent on color Doppler imaging with normal direction of blood flow towards the liver. IMPRESSION: 1. Normal sonographic appearance of the gallbladder and biliary tree. No gallstones. 2. Hepatic steatosis. Electronically Signed   By: Rubye Oaks M.D.   On: 09/20/2017 03:23    EKG:   Orders placed or performed during the hospital encounter of 09/20/17  . ED EKG  . ED EKG    ASSESSMENT AND PLAN:   37 year old female with no significant past medical history presents to hospital secondary to worsening abdominal pain and noted to have acute pancreatitis.  1.  Acute pancreatitis-secondary to hypertriglyceridemia.  Patient not aware of the diagnosis. -No alcohol consumption or no new medications. -Triglycerides are improving, abdominal pain is improving.  Remains on insulin drip until triglycerides are less than thousand -GI has been consulted.  Clears may be later today. -Continue IV fluids  2.  Hypomagnesemia and hypophosphatemia-to be replaced appropriately  3.  Hyponatremia-pseudohyponatremia from elevated  triglycerides.  Improving, continue IV fluids  4.  Hypertriglyceridemia-recommend starting statin and niacin added  5. DVT Prophylaxis- SQ heparin  Can be transferred to the floor once off of insulin drip.    All the records are reviewed and case discussed with Care Management/Social Workerr. Management plans discussed with the patient, family and they are in agreement.  CODE STATUS: Full code  TOTAL TIME TAKING CARE OF THIS PATIENT: 38 minutes.   POSSIBLE D/C IN 1-2 DAYS, DEPENDING ON CLINICAL CONDITION.   Enid Baas M.D on 09/21/2017 at 9:55 AM  Between 7am to 6pm - Pager - [phone]  After 6pm go to [email or web address] - Social research officer, government  [facility]  Office  [phone]  CC: Primary care physician; Center, [facility]

## 2017-09-21 NOTE — Progress Notes (Signed)
Pt arrived to the unit VSS: 119/73 (86), 95, 21, 97% on RA. Pt A& O x 4 resting in bed with family at bedside.

## 2017-09-21 NOTE — Consult Note (Signed)
Melodie Bouillon, MD [address], [address], [place], [place], [address] [address], [place], [place], [address] Phone: [phone]  Fax: [phone]  Consultation  Referring Provider:     Dr. Nemiah Commander Primary Care Physician:  Center, [facility] Reason for Consultation:     Abdominal pain, pancreatitis  Date of Admission:  09/20/2017 Date of Consultation:  09/21/2017         HPI:   Claudia Cannon is a 37 y.o. female who presents with abdominal pain, and admitted with acute pancreatitis.  Patient reports pain started 3 or 4 days ago, 10/10, diffuse, associated with nausea and vomiting, no hematemesis, with radiation to the back, with no melena or hematochezia.  No previous history of similar symptoms.  Denies any alcohol use.  States last drink was 4 years ago.  Denies any use of new herbal supplements.  Denies any new or recent changes in medications.  Triglycerides were noted to be over 5000 on admission.Lipase of 620.  CT showed acute pancreatitis.  Moderate amount of ill-defined fluid surrounding the body and tail of the pancreas.  No pseudocyst.  Fatty infiltration of the liver.  Patient was in the ER on March 19, with a headache, and was diagnosed with Bell's palsy.  She was given valacyclovir and prednisone, and she finished this 2 days ago.  Past medical history: Bell's palsy Past surgical history: No surgeries  Prior to Admission medications   Medication Sig Start Date End Date Taking? Authorizing Provider  valACYclovir (VALTREX) 1000 MG tablet Take 1 tablet (1,000 mg total) by mouth 3 (three) times daily. Patient not taking: Reported on 09/20/2017 08/26/17   Cuthriell, Delorise Royals, PA-C    Family History  Problem Relation Age of Onset  . Diabetes Mother   . Hypertension Mother   . Hyperlipidemia Mother   . Chronic Renal Failure Mother      Social History   Tobacco Use  . Smoking status: Never Smoker  . Smokeless tobacco:  Never Used  Substance Use Topics  . Alcohol use: No    Frequency: Never  . Drug use: No    Allergies as of 09/20/2017  . (No Known Allergies)    Review of Systems:    All systems reviewed and negative except where noted in HPI.   Physical Exam:  Vital signs in last 24 hours: Vitals:   09/21/17 0730 09/21/17 0800 09/21/17 1000 09/21/17 1130  BP:   110/61   Pulse: (!) 126 (!) 127 (!) 128   Resp: (!) 27 18 (!) 31   Temp:   (!) 102.5 F (39.2 C) (!) 100.6 F (38.1 C)  TempSrc:   Oral Oral  SpO2: 93% 93% 91%   Weight:      Height:       Last BM Date: 09/19/17(per pt. report) General:   Pleasant, cooperative in NAD Head:  Normocephalic and atraumatic. Eyes:   No icterus.   Conjunctiva pink. PERRLA. Ears:  Normal auditory acuity. Neck:  Supple; no masses or thyroidomegaly Lungs: Respirations even and unlabored. Lungs clear to auscultation bilaterally.   No wheezes, crackles, or rhonchi.  Abdomen:  Soft, nondistended, tender to palpation, left upper quadrant. Normal bowel sounds. No appreciable masses or hepatomegaly.  No rebound or guarding.  Neurologic:  Alert and oriented x3;  grossly normal neurologically. Skin:  Intact without significant lesions or rashes. Cervical Nodes:  No significant cervical adenopathy. Psych:  Alert and cooperative. Normal affect.  LAB RESULTS:  Recent Labs    09/20/17 0201 09/20/17 2043 09/21/17 0430  WBC 16.5* 15.9* 12.0*  HGB UNABLE TO REPORT DUE TO LIPEMIC INTERFERENCE 12.9 11.7*  HCT 37.0 37.0 36.8  PLT 249 216 223   BMET Recent Labs    09/20/17 1612 09/20/17 2043 09/21/17 0402  NA 121* 123* 129*  K 3.5 2.9* 3.6  CL 88* 88* 98*  CO2 25 24 16*  GLUCOSE 159* 152* 179*  BUN <5* <5* <5*  CREATININE <0.30* 0.31* 0.41*  CALCIUM 7.1* 6.9* 6.2*   LFT Recent Labs    09/21/17 0402  PROT 5.8*  ALBUMIN 3.0*  AST 42*  ALT 65*  ALKPHOS 60  BILITOT 2.5*   PT/INR No results for input(s): LABPROT, INR in the last 72  hours.  STUDIES: Dg Abd 1 View  Result Date: 09/21/2017 CLINICAL DATA:  Abdominal pain EXAM: ABDOMEN - 1 VIEW COMPARISON:  None. FINDINGS: Bowel gas pattern is nonobstructive. Mass effect is seen on the upper portion of the stomach, likely related to the fluid/edema seen about the pancreas on yesterday's CT, likely with associated thickening of the walls of the stomach. No evidence of free intraperitoneal air. No evidence of renal or ureteral calculi. Osseous structures are unremarkable. IMPRESSION: 1. Mass effect on the superior/posterior margin of the stomach, almost certainly related to the acute pancreatitis with associated peripancreatic fluid/edema demonstrated on yesterday's CT, likely with associated reactive thickening of the walls of the stomach. 2. Nonobstructive bowel gas pattern. Electronically Signed   By: Bary RichardStan  Maynard M.D.   On: 09/21/2017 08:05   Ct Abdomen Pelvis W Contrast  Result Date: 09/20/2017 CLINICAL DATA:  Intermittent upper abdominal pain since last week. EXAM: CT ABDOMEN AND PELVIS WITH CONTRAST TECHNIQUE: Multidetector CT imaging of the abdomen and pelvis was performed using the standard protocol following bolus administration of intravenous contrast. CONTRAST:  100mL ISOVUE-300 IOPAMIDOL (ISOVUE-300) INJECTION 61% COMPARISON:  None. FINDINGS: Lower chest: Mild atelectasis at the RIGHT lung base. Hepatobiliary: Fatty infiltration of the liver, with focal sparing adjacent to the gallbladder fossa. No suspicious mass or lesion within the liver. Gallbladder is unremarkable. No bile duct dilatation. Pancreas: Ill-defined fluid along the body and tail of the pancreas, consistent with acute pancreatitis. No peripancreatic hemorrhage seen. No parenchymal mass or hemorrhage seen. No pancreatic duct dilatation. Spleen: Normal in size without focal abnormality. Adrenals/Urinary Tract: Adrenal glands are unremarkable. Kidneys are normal, without renal calculi, focal lesion, or  hydronephrosis. Bladder is unremarkable. Stomach/Bowel: No dilated large or small bowel loops. No bowel wall thickening or evidence of bowel wall inflammation. Appendix is normal. Stomach is unremarkable, perhaps mild reactive thickening of the walls of the gastric fundus. Vascular/Lymphatic: No significant vascular findings are present. No enlarged abdominal or pelvic lymph nodes. Reproductive: Incidental note of a small crenated cyst in the RIGHT ovary. No significant findings within either adnexal region. Uterus is unremarkable. Other: Trace free fluid in the RIGHT lower quadrant and pelvis, likely related to the aforementioned pancreatitis. No abscess collection or free intraperitoneal air. Musculoskeletal: No acute or significant osseous findings. IMPRESSION: 1. Acute pancreatitis. Moderate amount of ill-defined fluid surrounding the body and tail of the pancreas. No pseudocyst formation. No peripancreatic hemorrhage. No pancreatic mass or pancreatic ductal dilatation seen. Probable reactive thickening of the walls of the adjacent stomach fundus. 2. Fatty infiltration of the liver. 3. Additional incidental findings detailed above. Electronically Signed   By: Bary RichardStan  Maynard M.D.   On: 09/20/2017 12:37   [place] Abdomen Limited Ruq  Result Date: 09/20/2017 CLINICAL DATA:  Upper abdominal pain. EXAM: ULTRASOUND ABDOMEN LIMITED RIGHT UPPER QUADRANT COMPARISON:  None. FINDINGS: Gallbladder: Physiologically distended. No gallstones or wall thickening visualized. No sonographic Murphy sign noted by sonographer. Common bile duct: Diameter: 2 mm, normal. Liver: No focal lesion identified. Diffusely increased in parenchymal echogenicity. Portal vein is patent on color Doppler imaging with normal direction of blood flow towards the liver. IMPRESSION: 1. Normal sonographic appearance of the gallbladder and biliary tree. No gallstones. 2. Hepatic steatosis. Electronically Signed   By: Rubye Oaks M.D.   On: 09/20/2017  03:23      Impression / Plan:   Claudia Cannon is a 37 y.o. y/o female with acute pancreatitis, from hypertriglyceridemia  Triglyceride level over 5000 on admission, latest level drawn this morning was 1217 Continue insulin drip, and check triglycerides every 12 hours Goal triglycerides is less than 500, after which insulin drip can be stopped Long-term, patient's triglycerides should be always maintained to less than 200   Check ionized calcium, and replace if low.  This is pending Also replace magnesium IV fluids, with lactated Ringer's to 50-300 cc an hour Pain control  Her bilirubin is elevated to 2.5, but alkaline phosphatase is normal.  Ultrasound shows normal gallbladder and biliary tree, no gallstones, normal common bile duct.  CT is also consistent with the same.  Therefore, unlikely to have gallstone pancreatitis Would recommend repeating CMP tonight or tomorrow  As far as medication induced pancreatitis goes, the only new medication she received was valacyclovir and prednisone.  Prednisone is associated with pancreatitis But is rare. since she has significant hypertriglyceridemia, that is the likely cause of her pancreatitis, and not the coincidental prednisone usage.    Once abdominal pain improves, can start oral diet as tolerated.  Would start with clear liquids, and advance to low-fat diet as tolerated.  Thank you for involving me in the care of this patient.      LOS: 1 day   Pasty Spillers, MD  09/21/2017, 12:33 PM

## 2017-09-21 NOTE — Progress Notes (Signed)
MEDICATION RELATED CONSULT NOTE - INITIAL   Pharmacy Consult for Electrolyte Management Indication: Pancreatitis on Insulin drip  No Known Allergies  Patient Measurements: Height: 5' (152.4 cm) Weight: 150 lb (68 kg) IBW/kg (Calculated) : 45.5 Adjusted Body Weight:    Vital Signs: Temp: 100.8 F (38.2 C) (04/14 0701) Temp Source: Oral (04/14 0200) BP: 114/67 (04/14 0700) Pulse Rate: 127 (04/14 0800) Intake/Output from previous day: 04/13 0701 - 04/14 0700 In: 7247.3 [I.V.:6394; IV Piggyback:853.3] Out: 3100 [Urine:3100] Intake/Output from this shift: Total I/O In: 0  Out: 500 [Urine:500]  Labs: Recent Labs    09/20/17 0201  09/20/17 1612 09/20/17 2043 09/21/17 0400 09/21/17 0402 09/21/17 0430  WBC 16.5*  --   --  15.9*  --   --  12.0*  HGB UNABLE TO REPORT DUE TO LIPEMIC INTERFERENCE  --   --  12.9  --   --  11.7*  HCT 37.0  --   --  37.0  --   --  36.8  PLT 249  --   --  216  --   --  223  CREATININE 0.56   < > <0.30* 0.31*  --  0.41*  --   MG  --    < > 2.5* 2.2 1.4*  --   --   PHOS  --    < > 1.7* 2.5 2.3*  --   --   ALBUMIN 3.7  --   --   --   --  3.0*  --   PROT 6.7  --   --   --   --  5.8*  --   AST 72*  --   --   --   --  42*  --   ALT 129*  --   --   --   --  65*  --   ALKPHOS 66  --   --   --   --  60  --   BILITOT 0.9  --   --   --   --  2.5*  --    < > = values in this interval not displayed.   Estimated Creatinine Clearance: 85.3 mL/min (A) (by C-G formula based on SCr of 0.41 mg/dL (L)).   Assessment: Patient is a 37yo female admitted for pancreatitis. Triglycerides on admission were elevated > 5000. Patient was initiated on insulin drip and IV fluids. Now patient with electrolyte abnormalities.  K: 3.6, Mg 1.4, Phos 1.8, Na: 129  Patient continues on insulin drip at 588ml/hr with D10 and normal saline running. Triglycerides trending down.   Goal of Therapy:  K 4.5 - 5.3, Mg > 2.0 while on insulin drip  Plan:  Will consolidate IV fluids to  D10/NS with 40mEq of KCl at 1900ml/hr.  Ordered KPhos 10mmol x 1  Recheck potassium this afternoon, and repeat BMP/Mg/Phos at 1800. Would like to keep potassium > 4.5 while on insulin drip.  Garlon HatchetJody Coty Larsh, PharmD, BCPS Clinical Pharmacist  09/21/2017

## 2017-09-21 NOTE — Progress Notes (Signed)
[facility] [place] Pulmonary Medicine Consultation      Name: Claudia Cannon MRN: [id number] DOB: 08/05/1980    ADMISSION DATE:  09/20/2017 CONSULTATION DATE:  09/20/2017  REFERRING MD :  Barbaraann RondoSridharan, Prasanna, MD   CHIEF COMPLAINT:   Abdominal pain x 10 days   HISTORY OF PRESENT ILLNESS    Claudia Cannon  is a 37 y.o. Cannon with a no known past medical history, who presents with abdominal pain worse in the epighatrium. Pt is Spanish-speaking, and speaks minimal [place]. Her daughter is at the bedside, and is able to translate. Patient states she has been experiencing mild postprandial epigastric pain for the past 10 days. She characterizes the discomfort as a dull pressure/ache. She endorses rapid/early satiety, but denies any progression of the pain over the course of the past 10 days until day of admission. She states that last night, the pain suddenly increased in severity, and became unbearable. The pain radiates to the lower chest at times, and she states she has had difficulty breathing 2/2 pain. She endorses abdominal distension and gas/discomfort. She endorses loss of appetite, but denies nausea and/or vomiting. She denies hematemesis, melena/hematochezia or urinary symptoms. She states she does not have any known active medical problems, and is not presently taking any medications. She does not drink alcohol. Her last PO intake was at ~1700PM on Friday (09/19/2017). Denies F/C, palpitations, diaphoresis, LH/LOC. She is in moderate abdominal discomfort at the time of my examination, but is otherwise healthy appearing.        SIGNIFICANT EVENTS   4/13 admitted to ICU   Review of Systems  Constitutional: Positive for malaise/fatigue. Negative for chills, diaphoresis, fever and weight loss.  HENT: Negative for congestion, hearing loss, sore throat and tinnitus.   Eyes: Negative for blurred vision, double vision and redness.  Respiratory: No SOB,  comfortable Cardiovascular: Negative for palpitations, orthopnea, claudication, leg swelling and PND.  Gastrointestinal: Positive for abdominal pain. Negative for blood in stool, constipation, diarrhea, heartburn, melena, nausea and vomiting.  Genitourinary: Negative for dysuria, frequency, hematuria and urgency.  Musculoskeletal: Negative for back pain, joint pain and neck pain.  Skin: Negative for itching and rash.  Neurological: Negative for dizziness, tingling, tremors, focal weakness, loss of consciousness and headaches.       VITAL SIGNS    Temp:  [98.1 F (36.7 C)-102.5 F (39.2 C)] 99.1 F (37.3 C) (04/14 1500) Pulse Rate:  [112-128] 114 (04/14 1300) Resp:  [18-35] 35 (04/14 1300) BP: (100-131)/(59-85) 108/59 (04/14 1200) SpO2:  [91 %-98 %] 95 % (04/14 1300)   HEMODYNAMICS:  no compromise  OXYGEN:  Room air  INTAKE / OUTPUT:  Intake/Output Summary (Last 24 hours) at 09/21/2017 1650 Last data filed at 09/21/2017 1500 Gross per 24 hour  Intake 4624.75 ml  Output 3825 ml  Net 799.75 ml       PHYSICAL EXAM   GENERAL:  Comfortable without distress, Pyrexial EYES: Pupils equal, round, reactive to light and accommodation. No scleral icterus. Extraocular muscles intact.  HEENT: Head atraumatic, normocephalic. Oropharynx and nasopharynx clear.  NECK:  Supple, no jugular venous distention. No thyroid enlargement, no tenderness.  LUNGS: Normal breath sounds bilaterally, no wheezing, rales,rhonchi or crepitation. No use of accessory muscles of respiration.  CARDIOVASCULAR: S1, S2 normal. No murmurs, rubs, or gallops.  ABDOMEN: Soft, 3+tenderness, 1+ distended. Inaudible Bowel sounds present. No organomegaly or mass.  EXTREMITIES: No pedal edema, cyanosis, or clubbing.  NEUROLOGIC: Cranial nerves II through XII are intact. Muscle strength  5/5 in all extremities. Sensation intact. Gait not checked.  PSYCHIATRIC: The patient is alert and oriented x 3.  SKIN: No obvious  rash, lesion, or ulcer.        LABS   LABS:  CBC Recent Labs  Lab 09/20/17 0201 09/20/17 2043 09/21/17 0430  WBC 16.5* 15.9* 12.0*  HGB UNABLE TO REPORT DUE TO LIPEMIC INTERFERENCE 12.9 11.7*  HCT 37.0 37.0 36.8  PLT 249 216 223   Coag's No results for input(s): APTT, INR in the last 168 hours. BMET Recent Labs  Lab 09/20/17 1612 09/20/17 2043 09/21/17 0402  NA 121* 123* 129*  K 3.5 2.9* 3.6  CL 88* 88* 98*  CO2 25 24 16*  BUN <5* <5* <5*  CREATININE <0.30* 0.31* 0.41*  GLUCOSE 159* 152* 179*   Electrolytes Recent Labs  Lab 09/20/17 1612 09/20/17 2043 09/21/17 0400 09/21/17 0402 09/21/17 0739  CALCIUM 7.1* 6.9*  --  6.2*  --   MG 2.5* 2.2 1.4*  --  1.4*  PHOS 1.7* 2.5 2.3*  --  1.8*   Sepsis Markers Recent Labs  Lab 09/20/17 1258 09/20/17 2043 09/21/17 0402  LATICACIDVEN 4.4* 3.4* 2.5*   ABG No results for input(s): PHART, PCO2ART, PO2ART in the last 168 hours. Liver Enzymes Recent Labs  Lab 09/20/17 0201 09/21/17 0402  AST 72* 42*  ALT 129* 65*  ALKPHOS 66 60  BILITOT 0.9 2.5*  ALBUMIN 3.7 3.0*   Cardiac Enzymes Recent Labs  Lab 09/20/17 0201  TROPONINI <0.03   Glucose Recent Labs  Lab 09/21/17 0927 09/21/17 1031 09/21/17 1127 09/21/17 1223 09/21/17 1332 09/21/17 1644  GLUCAP 180* 176* 164* 165* 158* 168*     Recent Results (from the past 240 hour(s))  MRSA PCR Screening     Status: None   Collection Time: 09/20/17  9:17 AM  Result Value Ref Range Status   MRSA by PCR NEGATIVE NEGATIVE Final    Comment:        The GeneXpert MRSA Assay (FDA approved for NASAL specimens only), is one component of a comprehensive MRSA colonization surveillance program. It is not intended to diagnose MRSA infection nor to guide or monitor treatment for MRSA infections. Performed at [facility], [address] [place], [place] [address]   Culture, blood (Routine X 2) w Reflex to ID Panel     Status: None (Preliminary  result)   Collection Time: 09/20/17 10:48 PM  Result Value Ref Range Status   Specimen Description BLOOD BLOOD LEFT HAND  Final   Special Requests   Final    BOTTLES DRAWN AEROBIC AND ANAEROBIC Blood Culture adequate volume   Culture   Final    NO GROWTH < 12 HOURS Performed at [facility], [address], [place], [place] [address]    Report Status PENDING  Incomplete  Culture, blood (Routine X 2) w Reflex to ID Panel     Status: None (Preliminary result)   Collection Time: 09/20/17 10:57 PM  Result Value Ref Range Status   Specimen Description BLOOD BLOOD RIGHT HAND  Final   Special Requests   Final    BOTTLES DRAWN AEROBIC AND ANAEROBIC Blood Culture adequate volume   Culture   Final    NO GROWTH < 12 HOURS Performed at [facility], [address] [place], [place] [address]    Report Status PENDING  Incomplete     Current Facility-Administered Medications:  .  acetaminophen (TYLENOL) tablet 650 mg, 650 mg, Oral,  Q4H PRN, 650 mg at 09/21/17 1057 **OR** acetaminophen (TYLENOL) suppository 650 mg, 650 mg, Rectal, Q6H PRN, Tukov, Magadalene S, NP, 650 mg at 09/20/17 2150 .  dextrose 10 % 950 mL with potassium chloride 40 mEq, sodium chloride 154 mEq infusion, , Intravenous, Continuous, Kalisetti, Radhika, MD, Last Rate: 100 mL/hr at 09/21/17 0920 .  fenofibrate tablet 160 mg, 160 mg, Oral, Daily, Marjie Skiff, Prasanna, MD, 160 mg at 09/20/17 0933 .  heparin injection 5,000 Units, 5,000 Units, Subcutaneous, Q8H, Annett Fabian, MD, 5,000 Units at 09/21/17 1327 .  HYDROmorphone (DILAUDID) injection 1 mg, 1 mg, Intravenous, Q3H PRN, 1 mg at 09/21/17 1528 **OR** HYDROmorphone (DILAUDID) injection 2 mg, 2 mg, Intravenous, Q3H PRN, Barbaraann Rondo, MD, 2 mg at 09/21/17 0540 .  MEDLINE mouth rinse, 15 mL, Mouth Rinse, BID, Kalisetti, Radhika, MD, 15 mL at 09/21/17 4098 .  niacin CR capsule 500 mg, 500 mg, Oral, QHS, Sridharan, Prasanna, MD .  ondansetron (ZOFRAN)  injection 4 mg, 4 mg, Intravenous, Q6H PRN, Barbaraann Rondo, MD, 4 mg at 09/21/17 0803 .  potassium PHOSPHATE 10 mmol in dextrose 5 % 250 mL infusion, 10 mmol, Intravenous, Once, Barefoot, Jody C, RPH, Last Rate: 42 mL/hr at 09/21/17 1136, 10 mmol at 09/21/17 1136 .  rosuvastatin (CRESTOR) tablet 40 mg, 40 mg, Oral, q1800, Barbaraann Rondo, MD, Stopped at 09/20/17 1826 .  senna-docusate (Senokot-S) tablet 2 tablet, 2 tablet, Oral, BID PRN, Marjie Skiff, Prasanna, MD .  sodium chloride 0.9 % 100 mL with insulin regular (NOVOLIN R,HUMULIN R) 100 Units infusion, , Intravenous, Continuous, Kalisetti, Radhika, MD, Last Rate: 8 mL/hr at 09/21/17 1300  IMAGING    Dg Abd 1 View  Result Date: 09/21/2017 CLINICAL DATA:  Abdominal pain EXAM: ABDOMEN - 1 VIEW COMPARISON:  None. FINDINGS: Bowel gas pattern is nonobstructive. Mass effect is seen on the upper portion of the stomach, likely related to the fluid/edema seen about the pancreas on yesterday's CT, likely with associated thickening of the walls of the stomach. No evidence of free intraperitoneal air. No evidence of renal or ureteral calculi. Osseous structures are unremarkable. IMPRESSION: 1. Mass effect on the superior/posterior margin of the stomach, almost certainly related to the acute pancreatitis with associated peripancreatic fluid/edema demonstrated on yesterday's CT, likely with associated reactive thickening of the walls of the stomach. 2. Nonobstructive bowel gas pattern. Electronically Signed   By: Bary Richard M.D.   On: 09/21/2017 08:05       MAJOR EVENTS/TEST RESULTS: None overnight  INDWELLING DEVICES:: Peripheral IVS  MICRO DATA: MRSA PCR negative Urine  Blood Resp   ANTIMICROBIALS:  Nil   ASSESSMENT/PLAN   1. Acute Pancreatitis secondary to Hypertriglyceridemia.  2. Severe  Hypertriglyceridemia may be familiar on insulin drip 3. Hypomagnesemia 4. Hypophosphatemia 5. Hypokalemia  Plan: 1. IVF, pain control,  monitor sepsis marker- lactic acid 2. Start clear liquid diet to decrease bacteria transloaction 3. Treat elevated triglycerides with insulin drip 4.  Target Glucose monitoring 5. Will need longterm  Lipid management therapy 6. GI and DVT prophylaxis 7. Monitor Ranson criteria 8. Electrolytes replacement; monitor Calcium levels for replacement    Family: patient and daughter updated  I have personally obtained a history, examined the patient, evaluated laboratory and independently reviewed  imaging results, formulated the assessment and plan and placed orders.  The Patient requires high complexity decision making for assessment and support, frequent evaluation and titration of therapies, application of advanced monitoring technologies and extensive interpretation of multiple databases. Critical Care Time  devoted to patient care services described in this note is 37 minutes.    Jackson Latino, M.D

## 2017-09-21 NOTE — Progress Notes (Signed)
CRITICAL VALUE ALERT  Critical Value:  Lactic 3.4   Date & Time Notied:  09/20/17 @ 2128  Provider Notified: Ms. Luci Bankukov, NP gave parameters to alert her if lactic is trending up, this lactic is trending down- no alert neccessary  Orders Received/Actions taken: will continue to monitor pt. closely

## 2017-09-21 NOTE — Progress Notes (Signed)
Claudia Cannon from Lab called to inform that the PCT test was unable to be completed due to the consistency of the pt.'s blood. Ms. Luci Bankukov, NP was informed.

## 2017-09-21 NOTE — Progress Notes (Signed)
MEDICATION RELATED CONSULT NOTE - INITIAL   Pharmacy Consult for Electrolyte Management Indication: Pancreatitis on Insulin drip  No Known Allergies  Patient Measurements: Height: 5' (152.4 cm) Weight: 150 lb (68 kg) IBW/kg (Calculated) : 45.5 Adjusted Body Weight:    Vital Signs: Temp: 100.8 F (38.2 C) (04/14 0701) Temp Source: Oral (04/14 0200) BP: 114/67 (04/14 0700) Pulse Rate: 127 (04/14 0800) Intake/Output from previous day: 04/13 0701 - 04/14 0700 In: 7247.3 [I.V.:6394; IV Piggyback:853.3] Out: 3100 [Urine:3100] Intake/Output from this shift: Total I/O In: 0  Out: 500 [Urine:500]  Labs: Recent Labs    09/20/17 0201  09/20/17 1612 09/20/17 2043 09/21/17 0400 09/21/17 0402 09/21/17 0430 09/21/17 0739  WBC 16.5*  --   --  15.9*  --   --  12.0*  --   HGB UNABLE TO REPORT DUE TO LIPEMIC INTERFERENCE  --   --  12.9  --   --  11.7*  --   HCT 37.0  --   --  37.0  --   --  36.8  --   PLT 249  --   --  216  --   --  223  --   CREATININE 0.56   < > <0.30* 0.31*  --  0.41*  --   --   MG  --    < > 2.5* 2.2 1.4*  --   --  1.4*  PHOS  --    < > 1.7* 2.5 2.3*  --   --  1.8*  ALBUMIN 3.7  --   --   --   --  3.0*  --   --   PROT 6.7  --   --   --   --  5.8*  --   --   AST 72*  --   --   --   --  42*  --   --   ALT 129*  --   --   --   --  65*  --   --   ALKPHOS 66  --   --   --   --  60  --   --   BILITOT 0.9  --   --   --   --  2.5*  --   --    < > = values in this interval not displayed.   Estimated Creatinine Clearance: 85.3 mL/min (A) (by C-G formula based on SCr of 0.41 mg/dL (L)).   Assessment: Patient is a 37yo female admitted for pancreatitis. Triglycerides on admission were elevated > 5000. Patient was initiated on insulin drip and IV fluids. Now patient with electrolyte abnormalities.  K: 3.6, Mg 1.4, Phos 1.8, Na: 129  Patient continues on insulin drip at 218ml/hr with D10 and normal saline running. Triglycerides trending down.   Goal of Therapy:  K 4.5  - 5.3, Mg > 2.0 while on insulin drip  Plan:  Will consolidate IV fluids to D10/NS with 40mEq of KCl at 16400ml/hr.  Ordered KPhos 10mmol x 1, Magnesium 4gm x 1  Recheck potassium this afternoon, and repeat BMP/Mg/Phos at 1800. Would like to keep potassium > 4.5 while on insulin drip.  Garlon HatchetJody Isack Lavalley, PharmD, BCPS Clinical Pharmacist  09/21/2017

## 2017-09-21 NOTE — Progress Notes (Signed)
Pt.'s CBG dropped to 148, consulted David in Pharmacy and was instructed to increase D 10 to 125 mL/h.  Ms. Luci Bankukov, NP approved change of rate.

## 2017-09-21 NOTE — Progress Notes (Signed)
Given verbal parameter from Dr. Peggye Pittichards to call Pharmacy for adjustment of D 10 gtt if pt.'s CBG drops below 150.

## 2017-09-21 NOTE — Progress Notes (Signed)
[facility] sat out of bed all morning, [facility] Co pain several times in Left upper quadrant abdo pain medicated for same with good effect with IV dilaudid. Started on a clear liquid diet, ate small amounts for lunch. Bathed. Had one episode of fever 102.5 became tachypneic, RR 35-40 with HR in 120-135 during this time, medicated po with tylenol with good effect. HR 100-115, RR 23, BG stable in 150-180 range, continues ion IV insulin drip. Family in to visit, questions answered as asked. IS encoraged

## 2017-09-22 LAB — URINALYSIS, ROUTINE W REFLEX MICROSCOPIC
BACTERIA UA: NONE SEEN
BILIRUBIN URINE: NEGATIVE
GLUCOSE, UA: NEGATIVE mg/dL
KETONES UR: 5 mg/dL — AB
NITRITE: NEGATIVE
PROTEIN: NEGATIVE mg/dL
Specific Gravity, Urine: 1.008 (ref 1.005–1.030)
pH: 7 (ref 5.0–8.0)

## 2017-09-22 LAB — GLUCOSE, CAPILLARY
GLUCOSE-CAPILLARY: 103 mg/dL — AB (ref 65–99)
GLUCOSE-CAPILLARY: 106 mg/dL — AB (ref 65–99)
GLUCOSE-CAPILLARY: 121 mg/dL — AB (ref 65–99)
GLUCOSE-CAPILLARY: 140 mg/dL — AB (ref 65–99)
GLUCOSE-CAPILLARY: 153 mg/dL — AB (ref 65–99)
GLUCOSE-CAPILLARY: 191 mg/dL — AB (ref 65–99)
Glucose-Capillary: 149 mg/dL — ABNORMAL HIGH (ref 65–99)
Glucose-Capillary: 111 mg/dL — ABNORMAL HIGH (ref 65–99)
Glucose-Capillary: 134 mg/dL — ABNORMAL HIGH (ref 65–99)
Glucose-Capillary: 111 mg/dL — ABNORMAL HIGH (ref 65–99)
Glucose-Capillary: 172 mg/dL — ABNORMAL HIGH (ref 65–99)
Glucose-Capillary: 178 mg/dL — ABNORMAL HIGH (ref 65–99)
Glucose-Capillary: 129 mg/dL — ABNORMAL HIGH (ref 65–99)
Glucose-Capillary: 153 mg/dL — ABNORMAL HIGH (ref 65–99)
Glucose-Capillary: 161 mg/dL — ABNORMAL HIGH (ref 65–99)
Glucose-Capillary: 136 mg/dL — ABNORMAL HIGH (ref 65–99)

## 2017-09-22 LAB — RENAL FUNCTION PANEL
Albumin: 2.8 g/dL — ABNORMAL LOW (ref 3.5–5.0)
Anion gap: 7 (ref 5–15)
BUN: <5 mg/dL — ABNORMAL LOW (ref 6–20)
CHLORIDE: 100 mmol/L — AB (ref 101–111)
CO2: 27 mmol/L (ref 22–32)
CREATININE: 0.41 mg/dL — AB (ref 0.44–1.00)
Calcium: 6.5 mg/dL — ABNORMAL LOW (ref 8.9–10.3)
GFR calc Af Amer: >60 mL/min (ref >60)
GFR calc non Af Amer: >60 mL/min (ref >60)
Glucose, Bld: 114 mg/dL — ABNORMAL HIGH (ref 65–99)
Phosphorus: 2.6 mg/dL (ref 2.5–4.6)
Potassium: 3.4 mmol/L — ABNORMAL LOW (ref 3.5–5.1)
Sodium: 134 mmol/L — ABNORMAL LOW (ref 135–145)

## 2017-09-22 LAB — TRIGLYCERIDES
TRIGLYCERIDES: 343 mg/dL — AB (ref <150)
Triglycerides: 505 mg/dL — ABNORMAL HIGH (ref <150)

## 2017-09-22 LAB — PROCALCITONIN: PROCALCITONIN: 0.53 ng/mL

## 2017-09-22 LAB — HEPATIC FUNCTION PANEL
ALBUMIN: 2.7 g/dL — AB (ref 3.5–5.0)
ALT: 40 U/L (ref 14–54)
AST: 29 U/L (ref 15–41)
Alkaline Phosphatase: 65 U/L (ref 38–126)
Bilirubin, Direct: 0.5 mg/dL (ref 0.1–0.5)
Indirect Bilirubin: 1.8 mg/dL — ABNORMAL HIGH (ref 0.3–0.9)
TOTAL PROTEIN: 5.9 g/dL — AB (ref 6.5–8.1)
Total Bilirubin: 2.3 mg/dL — ABNORMAL HIGH (ref 0.3–1.2)

## 2017-09-22 LAB — HIV ANTIBODY (ROUTINE TESTING W REFLEX): HIV Screen 4th Generation wRfx: NONREACTIVE

## 2017-09-22 LAB — LIPASE, BLOOD: LIPASE: 76 U/L — AB (ref 11–51)

## 2017-09-22 MED ORDER — PIPERACILLIN-TAZOBACTAM 3.375 G IVPB: 3.3750 g | Freq: Three times a day (TID) | INTRAVENOUS | Status: DC

## 2017-09-22 MED ORDER — INSULIN ASPART 100 UNIT/ML ~~LOC~~ SOLN: 0.0000 [IU] | Freq: Every day | SUBCUTANEOUS | Status: DC

## 2017-09-22 MED ORDER — PIPERACILLIN-TAZOBACTAM 3.375 G IVPB
3.3750 g | Freq: Three times a day (TID) | INTRAVENOUS | Status: DC
  Administered 2017-09-22 – 2017-09-24 (×5): 3.375 g via INTRAVENOUS
  Filled 2017-09-22 (×5): qty 50

## 2017-09-22 MED ORDER — SODIUM CHLORIDE 0.9 % IV SOLN
INTRAVENOUS | Status: AC
  Administered 2017-09-22 – 2017-09-23 (×2): via INTRAVENOUS

## 2017-09-22 MED ORDER — SODIUM CHLORIDE 0.9 % IV SOLN
1.0000 g | Freq: Once | INTRAVENOUS | Status: AC
  Administered 2017-09-22: 1 g via INTRAVENOUS
  Filled 2017-09-22: qty 10

## 2017-09-22 MED ORDER — INSULIN ASPART 100 UNIT/ML ~~LOC~~ SOLN
0.0000 [IU] | Freq: Three times a day (TID) | SUBCUTANEOUS | Status: DC
  Administered 2017-09-22 – 2017-09-24 (×5): 2 [IU] via SUBCUTANEOUS
  Filled 2017-09-22 (×4): qty 1

## 2017-09-22 NOTE — Progress Notes (Signed)
14:30 Pts HR in the 140's. Temp of 102. Tylenol administered. MD notified. No new orders given.  15:30: Temp of 99.1. HR has come down into the 120-130's.

## 2017-09-22 NOTE — Progress Notes (Signed)
Melodie Bouillon, MD [address], [place], [place], [address] [address], [place], [place], [address] Phone: [phone]  Fax: [phone]   Subjective: Patient resting in bed comfortably.  Abdominal pain is improving.  No nausea or vomiting.   Objective: Exam: Vital signs in last 24 hours: Vitals:   09/22/17 0700 09/22/17 0800 09/22/17 0900 09/22/17 1000  BP:  102/62 107/70 109/66  Pulse: (!) 111 (!) 112 (!) 112 (!) 117  Resp: 20 19 19  (!) 25  Temp:  99.1 F (37.3 C)    TempSrc:  Oral    SpO2: 94% 98% 93% 92%  Weight:      Height:       Weight change:   Intake/Output Summary (Last 24 hours) at 09/22/2017 1621 Last data filed at 09/22/2017 1320 Gross per 24 hour  Intake 2845.99 ml  Output 1300 ml  Net 1545.99 ml    General: No acute distress, AAO x3 Abd: Soft, NT/ND, No HSM Skin: Warm, no rashes Neck: Supple, Trachea midline   Lab Results: Lab Results  Component Value Date   WBC 12.0 (H) 09/21/2017   HGB 11.7 (L) 09/21/2017   HCT 36.8 09/21/2017   MCV 89.5 09/21/2017   PLT 223 09/21/2017   Micro Results: Recent Results (from the past 240 hour(s))  Urine Culture     Status: None (Preliminary result)   Collection Time: 09/20/17  5:29 AM  Result Value Ref Range Status   Specimen Description   Final    URINE, RANDOM Performed at [facility], [address] [place], [place] [address]    Special Requests   Final    NONE Performed at [facility], [address] [place], [place] [address]    Culture   Final    CULTURE REINCUBATED FOR BETTER GROWTH Performed at [facility], [address] [place], [place] [address]    Report Status PENDING  Incomplete  MRSA PCR Screening     Status: None   Collection Time: 09/20/17  9:17 AM  Result Value Ref Range Status   MRSA by PCR NEGATIVE NEGATIVE Final    Comment:        The GeneXpert MRSA Assay (FDA approved for NASAL specimens only), is one  component of a comprehensive MRSA colonization surveillance program. It is not intended to diagnose MRSA infection nor to guide or monitor treatment for MRSA infections. Performed at [facility], [address] [place], [place] [address]   Culture, blood (Routine X 2) w Reflex to ID Panel     Status: None (Preliminary result)   Collection Time: 09/20/17 10:48 PM  Result Value Ref Range Status   Specimen Description BLOOD BLOOD LEFT HAND  Final   Special Requests   Final    BOTTLES DRAWN AEROBIC AND ANAEROBIC Blood Culture adequate volume   Culture   Final    NO GROWTH 2 DAYS Performed at [facility], [address] [place], [place] [address]    Report Status PENDING  Incomplete  Culture, blood (Routine X 2) w Reflex to ID Panel     Status: None (Preliminary result)   Collection Time: 09/20/17 10:57 PM  Result Value Ref Range Status   Specimen Description BLOOD BLOOD RIGHT HAND  Final   Special Requests   Final    BOTTLES DRAWN AEROBIC AND ANAEROBIC Blood Culture adequate volume   Culture   Final    NO GROWTH 2 DAYS Performed at  [facility], [address] [place], [place] [address]    Report Status PENDING  Incomplete   Studies/Results: Dg Abd 1 View  Result Date: 09/21/2017 CLINICAL DATA:  Abdominal pain EXAM: ABDOMEN - 1 VIEW COMPARISON:  None. FINDINGS: Bowel gas pattern is nonobstructive. Mass effect is seen on the upper portion of the stomach, likely related to the fluid/edema seen about the pancreas on yesterday's CT, likely with associated thickening of the walls of the stomach. No evidence of free intraperitoneal air. No evidence of renal or ureteral calculi. Osseous structures are unremarkable. IMPRESSION: 1. Mass effect on the superior/posterior margin of the stomach, almost certainly related to the acute pancreatitis with associated peripancreatic fluid/edema demonstrated on yesterday's CT, likely with associated reactive thickening of  the walls of the stomach. 2. Nonobstructive bowel gas pattern. Electronically Signed   By: Bary RichardStan  Maynard M.D.   On: 09/21/2017 08:05   Medications:  Scheduled Meds: . fenofibrate  160 mg Oral Daily  . heparin injection (subcutaneous)  5,000 Units Subcutaneous Q8H  . insulin aspart  0-5 Units Subcutaneous QHS  . insulin aspart  0-9 Units Subcutaneous TID WC  . mouth rinse  15 mL Mouth Rinse BID  . niacin  500 mg Oral QHS  . rosuvastatin  40 mg Oral q1800   Continuous Infusions: . sodium chloride 75 mL/hr at 09/22/17 1438  . piperacillin-tazobactam (ZOSYN)  IV     PRN Meds:.acetaminophen **OR** acetaminophen, HYDROmorphone (DILAUDID) injection **OR** HYDROmorphone (DILAUDID) injection, ondansetron (ZOFRAN) IV, senna-docusate   Assessment: Active Problems:   Other acute pancreatitis without necrosis or infection   Hypertriglyceridemia   Hyperglycemia without ketosis   Acute pancreatitis    Plan: Triglycerides are around 300, at this time Insulin drip can be stopped it has not already  Patient has had fevers, and tachycardia This is likely inflammatory reaction induced fever Primary team has started Zosyn which is reasonable Would also obtain UA, blood cultures, and rule out infection Once hepatic functional panel was repeated today, if bilirubin is still elevated, can order MRCP to rule out biliary duct obstruction,, and will also allow for evaluation of the pancreas,  Long-term, patient's triglycerides should always be maintained less than 200 She should follow-up with primary care provider within 1 week after discharge in regard to this Primary team monitoring electrolytes and replacing calcium and magnesium as needed  Continue IV fluids, lactated Ringer's at 250-300 cc an hour.   LOS: 2 days   Melodie BouillonVarnita Tahiliani, MD 09/22/2017, 4:21 PM

## 2017-09-22 NOTE — Progress Notes (Signed)
Zosyn held until we recieve results of urinalysis per pharmacy.

## 2017-09-22 NOTE — Progress Notes (Signed)
[facility] [place] [facility]   PATIENT NAME: Claudia Cannon    MR#:  [id number]  DATE OF BIRTH:  02-19-1981  SUBJECTIVE:  CHIEF COMPLAINT:   Chief Complaint  Patient presents with  . Abdominal Pain   -Triglyceride levels are down to 300s now.  Spiking fevers as high as 103 F -Tachycardic and still has severe abdominal pain.  REVIEW OF SYSTEMS:  Review of Systems  Constitutional: Negative for chills, fever and malaise/fatigue.  HENT: Negative for congestion, ear discharge, hearing loss and nosebleeds.   Eyes: Negative for blurred vision and double vision.  Respiratory: Negative for cough, shortness of breath and wheezing.   Cardiovascular: Negative for chest pain and palpitations.  Gastrointestinal: Positive for abdominal pain, nausea and vomiting. Negative for constipation and diarrhea.  Genitourinary: Negative for dysuria.  Musculoskeletal: Negative for myalgias.  Neurological: Negative for dizziness, focal weakness, seizures, weakness and headaches.  Psychiatric/Behavioral: Negative for depression.    DRUG ALLERGIES:  No Known Allergies  VITALS:  Blood pressure 109/66, pulse (!) 117, temperature 99.1 F (37.3 C), temperature source Oral, resp. rate (!) 25, height 5' (1.524 m), weight 68 kg (150 lb), SpO2 92 %.  PHYSICAL EXAMINATION:  Physical Exam  GENERAL:  37 y.o.-year-old patient lying in the bed with no acute distress.  EYES: Pupils equal, round, reactive to light and accommodation. No scleral icterus. Extraocular muscles intact.  HEENT: Head atraumatic, normocephalic. Oropharynx and nasopharynx clear.  NECK:  Supple, no jugular venous distention. No thyroid enlargement, no tenderness.  LUNGS: Normal breath sounds bilaterally, no wheezing, rales,rhonchi or crepitation. No use of accessory muscles of respiration.  CARDIOVASCULAR: S1, S2 normal. No murmurs, rubs, or gallops.  ABDOMEN: Soft, very tender in the left upper  quadrant with voluntary guarding, nondistended. Bowel sounds present. No organomegaly or mass.  EXTREMITIES: No pedal edema, cyanosis, or clubbing.  NEUROLOGIC: Cranial nerves II through XII are intact. Muscle strength 5/5 in all extremities. Sensation intact. Gait not checked.  PSYCHIATRIC: The patient is alert and oriented x 3.  SKIN: No obvious rash, lesion, or ulcer.    LABORATORY PANEL:   CBC Recent Labs  Lab 09/21/17 0430  WBC 12.0*  HGB 11.7*  HCT 36.8  PLT 223   ------------------------------------------------------------------------------------------------------------------  Chemistries  Recent Labs  Lab 09/21/17 1845 09/21/17 2105 09/22/17 0030  NA 131* 131* 134*  K 3.5 3.4* 3.4*  CL 99* 99* 100*  CO2 24 24 27   GLUCOSE 158* 145* 114*  BUN <5* <5* <5*  CREATININE 0.49 0.41* 0.41*  CALCIUM 6.2* 6.1* 6.5*  MG  --  2.4  --   AST 32  --   --   ALT 53  --   --   ALKPHOS 60  --   --   BILITOT 1.6*  --   --    ------------------------------------------------------------------------------------------------------------------  Cardiac Enzymes Recent Labs  Lab 09/20/17 0201  TROPONINI <0.03   ------------------------------------------------------------------------------------------------------------------  RADIOLOGY:  Dg Abd 1 View  Result Date: 09/21/2017 CLINICAL DATA:  Abdominal pain EXAM: ABDOMEN - 1 VIEW COMPARISON:  None. FINDINGS: Bowel gas pattern is nonobstructive. Mass effect is seen on the upper portion of the stomach, likely related to the fluid/edema seen about the pancreas on yesterday's CT, likely with associated thickening of the walls of the stomach. No evidence of free intraperitoneal air. No evidence of renal or ureteral calculi. Osseous structures are unremarkable. IMPRESSION: 1. Mass effect on the superior/posterior margin of the stomach, almost certainly related  to the acute pancreatitis with associated peripancreatic fluid/edema demonstrated  on yesterday's CT, likely with associated reactive thickening of the walls of the stomach. 2. Nonobstructive bowel gas pattern. Electronically Signed   By: Bary RichardStan  Maynard M.D.   On: 09/21/2017 08:05    EKG:   Orders placed or performed during the hospital encounter of 09/20/17  . ED EKG  . ED EKG    ASSESSMENT AND PLAN:   37 year old female with no significant past medical history presents to hospital secondary to worsening abdominal pain and noted to have acute pancreatitis.  1.  Acute pancreatitis-secondary to hypertriglyceridemia.  Patient not aware of the diagnosis. -No alcohol consumption or no new medications. -Triglycerides are improving- off insulin drip -GI has been consulted.  On clear liquid diet. -Continue IV fluids -Due to worsening abdominal pain and spiking fevers-started on Zosyn. -If does not improve, will repeat CT of the abdomen  2.  Hypomagnesemia and hypophosphatemia-being replaced appropriately  3.  Hyponatremia-pseudohyponatremia from elevated triglycerides.  Improving, continue IV fluids  4.  Hypertriglyceridemia-charted on simvastatin, fenofibrate and niacin  5.  New onset diabetes mellitus-A1c of 7.2.  However sugars have been low here, patient remains on D10 drip.  Trying to wean off the dextrose drip.  6. DVT Prophylaxis- SQ heparin  7.  Hypocalcemia-low serum albumin.  Corrected calcium is still within normal limits.  Can be transferred to the floor. Discussed with ICU attending    All the records are reviewed and case discussed with Care Management/Social Workerr. Management plans discussed with the patient, family and they are in agreement.  CODE STATUS: Full code  TOTAL TIME TAKING CARE OF THIS PATIENT: 37 minutes.   POSSIBLE D/C IN 1-2 DAYS, DEPENDING ON CLINICAL CONDITION.   Enid BaasKALISETTI,Harshil Cavallaro M.D on 09/22/2017 at 12:42 PM  Between 7am to 6pm - Pager - [phone]  After 6pm go to [email or web address] - Social research officer, governmentpassword EPAS [facility]  [facility] [place]  [facility]  Office  [phone]  CC: Primary care physician; Center, [facility]

## 2017-09-22 NOTE — Progress Notes (Signed)
Inpatient Diabetes Program Recommendations  AACE/ADA: New Consensus Statement on Inpatient Glycemic Control (2015)  Target Ranges:  Prepandial:   less than 140 mg/dL      Peak postprandial:   less than 180 mg/dL (1-2 hours)      Critically ill patients:  140 - 180 mg/dL   Lab Results  Component Value Date   GLUCAP 106 (H) 09/22/2017   HGBA1C 7.2 (H) 09/21/2017    Review of Glycemic ControlResults for Claudia NimrodLEGRIA MORALES, Terika (MRN [id number]) as of 09/22/2017 11:20  Ref. Range 09/22/2017 06:22 09/22/2017 08:16 09/22/2017 09:07 09/22/2017 10:25 09/22/2017 11:03  Glucose-Capillary Latest Ref Range: 65 - 99 mg/dL 409149 (H) 811111 (H) 914134 (H) 111 (H) 106 (H)    Diabetes history: None noted however A1C=7.2%- Could this be new diagnosis of DM? Outpatient Diabetes medications: None Current orders for Inpatient glycemic control:  None- note IV insulin stopped today  Inpatient Diabetes Program Recommendations:    Please consider adding Novolog sensitive tid with meals and HS while in the hospital.  If this is new diagnosis of DM, patient will need teaching regarding Diabetes?  Will follow.   Thanks,  Beryl MeagerJenny Yasmin Dibello, RN, BC-ADM Inpatient Diabetes Coordinator Pager [phone] (8a-5p)

## 2017-09-23 LAB — URINE CULTURE

## 2017-09-23 LAB — BASIC METABOLIC PANEL
Anion gap: 8 (ref 5–15)
CALCIUM: 7.5 mg/dL — AB (ref 8.9–10.3)
CO2: 24 mmol/L (ref 22–32)
CREATININE: 0.45 mg/dL (ref 0.44–1.00)
Chloride: 96 mmol/L — ABNORMAL LOW (ref 101–111)
GFR calc Af Amer: >60 mL/min (ref >60)
GLUCOSE: 192 mg/dL — AB (ref 65–99)
POTASSIUM: 3.8 mmol/L (ref 3.5–5.1)
SODIUM: 128 mmol/L — AB (ref 135–145)

## 2017-09-23 LAB — GLUCOSE, CAPILLARY
GLUCOSE-CAPILLARY: 128 mg/dL — AB (ref 65–99)
GLUCOSE-CAPILLARY: 173 mg/dL — AB (ref 65–99)
GLUCOSE-CAPILLARY: 187 mg/dL — AB (ref 65–99)
Glucose-Capillary: 174 mg/dL — ABNORMAL HIGH (ref 65–99)
Glucose-Capillary: 168 mg/dL — ABNORMAL HIGH (ref 65–99)

## 2017-09-23 LAB — LIPASE, BLOOD: LIPASE: 45 U/L (ref 11–51)

## 2017-09-23 MED ORDER — ENOXAPARIN SODIUM 40 MG/0.4ML ~~LOC~~ SOLN
40.0000 mg | SUBCUTANEOUS | Status: DC
  Administered 2017-09-23: 40 mg via SUBCUTANEOUS
  Filled 2017-09-23: qty 0.4

## 2017-09-23 MED ORDER — LIVING WELL WITH DIABETES BOOK - IN SPANISH
Freq: Once | Status: AC
  Administered 2017-09-23: 13:00:00
  Filled 2017-09-23: qty 1

## 2017-09-23 MED ORDER — SODIUM CHLORIDE 0.9% FLUSH: 10.0000 mL | INTRAVENOUS | Status: DC | PRN

## 2017-09-23 MED ORDER — GLIPIZIDE ER 5 MG PO TB24
5.0000 mg | ORAL_TABLET | Freq: Every day | ORAL | Status: DC
  Administered 2017-09-23 – 2017-09-24 (×2): 5 mg via ORAL
  Filled 2017-09-23 (×2): qty 1

## 2017-09-23 NOTE — Progress Notes (Addendum)
Claudia BouillonVarnita Almendra Loria, MD [address], [address], [place], [place], [address] [address], [address], [place], [place], [address] Phone: [phone]  Fax: [phone]   Subjective:  Patient states abdominal pain is better.  She is laying comfortably today.  States she tolerated liquid diet without difficulty.  No nausea or vomiting.  No fever.  Objective: Exam: Vital signs in last 24 hours: Vitals:   09/23/17 0230 09/23/17 0617 09/23/17 0814 09/23/17 1228  BP:   108/67 111/73  Pulse:   (!) 103 (!) 112  Resp:      Temp: 99.9 F (37.7 C) 98.4 F (36.9 C) 98.7 F (37.1 C) 99.6 F (37.6 C)  TempSrc: Oral Oral Oral   SpO2:   99% 99%  Weight:      Height:       Weight change:   Intake/Output Summary (Last 24 hours) at 09/23/2017 1342 Last data filed at 09/23/2017 1015 Gross per 24 hour  Intake 1296.25 ml  Output 450 ml  Net 846.25 ml    General: No acute distress, AAO x3 Abd: Soft, NT/ND, No HSM Skin: Warm, no rashes Neck: Supple, Trachea midline   Lab Results: Lab Results  Component Value Date   WBC 12.0 (H) 09/21/2017   HGB 11.7 (L) 09/21/2017   HCT 36.8 09/21/2017   MCV 89.5 09/21/2017   PLT 223 09/21/2017   Micro Results: Recent Results (from the past 240 hour(s))  Urine Culture     Status: Abnormal   Collection Time: 09/20/17  5:29 AM  Result Value Ref Range Status   Specimen Description   Final    URINE, RANDOM Performed at [facility], [address] [place], [place] [address]    Special Requests   Final    NONE Performed at [facility], [address] [place], [place] [address]    Culture 40,000 COLONIES/mL STREPTOCOCCUS PNEUMONIAE (A)  Final   Report Status 09/23/2017 FINAL  Final   Organism ID, Bacteria STREPTOCOCCUS PNEUMONIAE (A)  Final      Susceptibility   Streptococcus pneumoniae - MIC*    ERYTHROMYCIN <=0.12 SENSITIVE Sensitive     LEVOFLOXACIN 0.5 SENSITIVE Sensitive     PENICILLIN (oral) <=0.06 SENSITIVE  Sensitive     CEFTRIAXONE (non-meningitis) <=0.12 SENSITIVE Sensitive     * 40,000 COLONIES/mL STREPTOCOCCUS PNEUMONIAE  MRSA PCR Screening     Status: None   Collection Time: 09/20/17  9:17 AM  Result Value Ref Range Status   MRSA by PCR NEGATIVE NEGATIVE Final    Comment:        The GeneXpert MRSA Assay (FDA approved for NASAL specimens only), is one component of a comprehensive MRSA colonization surveillance program. It is not intended to diagnose MRSA infection nor to guide or monitor treatment for MRSA infections. Performed at [facility], [address] [place], [place] [address]   Culture, blood (Routine X 2) w Reflex to ID Panel     Status: None (Preliminary result)   Collection Time: 09/20/17 10:48 PM  Result Value Ref Range Status   Specimen Description BLOOD BLOOD LEFT HAND  Final   Special Requests   Final    BOTTLES DRAWN AEROBIC AND ANAEROBIC Blood Culture adequate volume   Culture   Final    NO GROWTH 3 DAYS Performed at [facility], [address] [place], [place] [address]    Report Status PENDING  Incomplete  Culture, blood (Routine X 2) w Reflex to ID Panel  Status: None (Preliminary result)   Collection Time: 09/20/17 10:57 PM  Result Value Ref Range Status   Specimen Description BLOOD BLOOD RIGHT HAND  Final   Special Requests   Final    BOTTLES DRAWN AEROBIC AND ANAEROBIC Blood Culture adequate volume   Culture   Final    NO GROWTH 3 DAYS Performed at [facility], [address], [place], [place] [address]    Report Status PENDING  Incomplete  CULTURE, BLOOD (ROUTINE X 2) w Reflex to ID Panel     Status: None (Preliminary result)   Collection Time: 09/22/17  5:21 PM  Result Value Ref Range Status   Specimen Description BLOOD BLOOD LEFT WRIST  Final   Special Requests   Final    BOTTLES DRAWN AEROBIC AND ANAEROBIC Blood Culture adequate volume   Culture   Final    NO GROWTH < 24 HOURS Performed at [facility], [address], [place], [place] [address]    Report Status PENDING  Incomplete  CULTURE, BLOOD (ROUTINE X 2) w Reflex to ID Panel     Status: None (Preliminary result)   Collection Time: 09/22/17  5:30 PM  Result Value Ref Range Status   Specimen Description BLOOD BLOOD RIGHT WRIST  Final   Special Requests   Final    BOTTLES DRAWN AEROBIC AND ANAEROBIC Blood Culture adequate volume   Culture   Final    NO GROWTH < 24 HOURS Performed at [facility], [address], [place], [place] [address]    Report Status PENDING  Incomplete   Studies/Results: No results found. Medications:  Scheduled Meds: . enoxaparin (LOVENOX) injection  40 mg Subcutaneous Q24H  . fenofibrate  160 mg Oral Daily  . glipiZIDE  5 mg Oral Q breakfast  . insulin aspart  0-5 Units Subcutaneous QHS  . insulin aspart  0-9 Units Subcutaneous TID WC  . mouth rinse  15 mL Mouth Rinse BID  . niacin  500 mg Oral QHS  . rosuvastatin  40 mg Oral q1800   Continuous Infusions: . piperacillin-tazobactam (ZOSYN)  IV Stopped (09/23/17 1042)   PRN Meds:.acetaminophen **OR** acetaminophen, HYDROmorphone (DILAUDID) injection **OR** HYDROmorphone (DILAUDID) injection, ondansetron (ZOFRAN) IV, senna-docusate, sodium chloride flush   Assessment: Active Problems:   Other acute pancreatitis without necrosis or infection   Hypertriglyceridemia   Hyperglycemia without ketosis   Acute pancreatitis    Plan: Patient is now transferred out of the ICU and on the floor No further fever Abdominal pain has improved, and tolerating liquid diet Okay to advance to low-fat diet today or tomorrow Patient is now on simvastatin, fenofibrate and niacin for hypertriglyceridemia She was encouraged to follow-up with PCP and maintain triglycerides less than 200  Her elevated bilirubin, is indirect, and thus not due to biliary duct obstruction. CT did not show any bile duct dilation either  Continue IV fluids,  pain control Now that patient is tolerating diet without pain, can decrease IV fluids and discontinue if patient maintaining good p.o. Intake  Would monitor carefully inpatient, until tachycardia resolved, and she is tolerating low-fat diet before discharge.  Patient should follow-up with PCP within 1-2 weeks of discharge Patient should follow-up in our GI clinic in 2-4 weeks after discharge as well   LOS: 3 days   Claudia Bouillon, MD 09/23/2017, 1:42 PM

## 2017-09-23 NOTE — Progress Notes (Signed)
MEDICATION RELATED CONSULT NOTE - INITIAL   Pharmacy Consult for Electrolyte Management  No Known Allergies  Patient Measurements: Height: 5\' 5"  (165.1 cm) Weight: 163 lb 5.8 oz (74.1 kg) IBW/kg (Calculated) : 57 Adjusted Body Weight:    Vital Signs: Temp: 98.7 F (37.1 C) (04/16 0814) Temp Source: Oral (04/16 0814) BP: 108/67 (04/16 0814) Pulse Rate: 103 (04/16 0814) Intake/Output from previous day: 04/15 0701 - 04/16 0700 In: 3902.2 [I.V.:3702.2; IV Piggyback:200] Out: 1750 [Urine:1750] Intake/Output from this shift: No intake/output data recorded.  Labs: Recent Labs    09/20/17 2043 09/21/17 0400  09/21/17 0402 09/21/17 0430 09/21/17 0739 09/21/17 1845 09/21/17 2105 09/22/17 0030 09/22/17 1730 09/23/17 0811  WBC 15.9*  --   --   --  12.0*  --   --   --   --   --   --   HGB 12.9  --   --   --  11.7*  --   --   --   --   --   --   HCT 37.0  --   --   --  36.8  --   --   --   --   --   --   PLT 216  --   --   --  223  --   --   --   --   --   --   CREATININE 0.31*  --   --  0.41*  --   --  0.49 0.41* 0.41*  --  0.45  MG 2.2 1.4*  --   --   --  1.4*  --  2.4  --   --   --   PHOS 2.5 2.3*  --   --   --  1.8*  --  1.4* 2.6  --   --   ALBUMIN  --   --    < > 3.0*  --   --  2.8*  --  2.8* 2.7*  --   PROT  --   --   --  5.8*  --   --  6.0*  --   --  5.9*  --   AST  --   --   --  42*  --   --  32  --   --  29  --   ALT  --   --   --  65*  --   --  53  --   --  40  --   ALKPHOS  --   --   --  60  --   --  60  --   --  65  --   BILITOT  --   --   --  2.5*  --   --  1.6*  --   --  2.3*  --   BILIDIR  --   --   --   --   --   --   --   --   --  0.5  --   IBILI  --   --   --   --   --   --   --   --   --  1.8*  --    < > = values in this interval not displayed.   Estimated Creatinine Clearance: 99.8 mL/min (by C-G formula based on SCr of 0.45 mg/dL).   Assessment: Patient is a 37yo female admitted for pancreatitis. Triglycerides on admission were elevated >  5000.  Patient was initiated on insulin drip and IV fluids. Now patient with electrolyte abnormalities.  K: 3.6, Mg 1.4, Phos 1.8, Na: 129  Patient continues on insulin drip at 698ml/hr with D10 and normal saline running. Triglycerides trending down.   Goal of Therapy:  K 4.5 - 5.3, Mg > 2.0 while on insulin drip  Plan:  Will consolidate IV fluids to D10/NS with 40mEq of KCl at 13100ml/hr.  Ordered KPhos 10mmol x 1, Magnesium 4gm x 1  Recheck potassium this afternoon, and repeat BMP/Mg/Phos at 1800. Would like to keep potassium > 4.5 while on insulin drip.  04/14 @ 2100 K 3.4, Phos 1.4, Ca 6.1, CCa (w/ albumin from 2 hours prior = 7.01). D10NS + 40 K running @ 100 ml/hr for K of 96 mEq/24 hours Will supplement K and Phos w/ additional KPhos 30 mmol IV x 1 over 6 hours and will recheck electrolytes w/ am labs. Per intensivist, CCa ~ 7 will not replace Ca at the moment, but will monitor w/ am labs.  4/16 Pharmacy consulted for electrolyte management while patient was on insulin drip. Insulin drip has been discontinued. Na is low but all other electrolytes WNL. Will sign off at this time.   Gardner CandleSheema M Blayre Papania, PharmD, BCPS Clinical Pharmacist 09/23/2017 9:50 AM

## 2017-09-23 NOTE — Progress Notes (Signed)
[facility] - [place] [facility]   PATIENT NAME: Claudia Cannon    MR#:  [id number]  DATE OF BIRTH:  [birth year]  SUBJECTIVE:  CHIEF COMPLAINT:   Chief Complaint  Patient presents with  . Abdominal Pain   -abdominal pain is improving.  No fevers today. -New onset diabetes.  Tolerating liquid diet well.  REVIEW OF SYSTEMS:  Review of Systems  Constitutional: Negative for chills, fever and malaise/fatigue.  HENT: Negative for congestion, ear discharge, hearing loss and nosebleeds.   Eyes: Negative for blurred vision and double vision.  Respiratory: Negative for cough, shortness of breath and wheezing.   Cardiovascular: Negative for chest pain and palpitations.  Gastrointestinal: Positive for abdominal pain and vomiting. Negative for constipation, diarrhea and nausea.  Genitourinary: Negative for dysuria.  Musculoskeletal: Negative for myalgias.  Neurological: Negative for dizziness, focal weakness, seizures, weakness and headaches.  Psychiatric/Behavioral: Negative for depression.    DRUG ALLERGIES:  No Known Allergies  VITALS:  Blood pressure 108/67, pulse (!) 103, temperature 98.7 F (37.1 C), temperature source Oral, resp. rate 18, height 5\' 5"  (1.651 m), weight 74.1 kg (163 lb 5.8 oz), SpO2 99 %.  PHYSICAL EXAMINATION:  Physical Exam  GENERAL:  37 y.o.-year-old patient lying in the bed with no acute distress.  EYES: Pupils equal, round, reactive to light and accommodation. No scleral icterus. Extraocular muscles intact.  HEENT: Head atraumatic, normocephalic. Oropharynx and nasopharynx clear.  NECK:  Supple, no jugular venous distention. No thyroid enlargement, no tenderness.  LUNGS: Normal breath sounds bilaterally, no wheezing, rales,rhonchi or crepitation. No use of accessory muscles of respiration.  CARDIOVASCULAR: S1, S2 normal. No murmurs, rubs, or gallops.  ABDOMEN: Soft, tender in the left upper quadrant with no guarding,  nondistended. Bowel sounds present. No organomegaly or mass.  EXTREMITIES: No pedal edema, cyanosis, or clubbing.  NEUROLOGIC: Cranial nerves II through XII are intact. Muscle strength 5/5 in all extremities. Sensation intact. Gait not checked.  PSYCHIATRIC: The patient is alert and oriented x 3.  SKIN: No obvious rash, lesion, or ulcer.    LABORATORY PANEL:   CBC Recent Labs  Lab 09/21/17 0430  WBC 12.0*  HGB 11.7*  HCT 36.8  PLT 223   ------------------------------------------------------------------------------------------------------------------  Chemistries  Recent Labs  Lab 09/21/17 2105  09/22/17 1730 09/23/17 0811  NA 131*   < >  --  128*  K 3.4*   < >  --  3.8  CL 99*   < >  --  96*  CO2 24   < >  --  24  GLUCOSE 145*   < >  --  192*  BUN <5*   < >  --  <5*  CREATININE 0.41*   < >  --  0.45  CALCIUM 6.1*   < >  --  7.5*  MG 2.4  --   --   --   AST  --   --  29  --   ALT  --   --  40  --   ALKPHOS  --   --  65  --   BILITOT  --   --  2.3*  --    < > = values in this interval not displayed.   ------------------------------------------------------------------------------------------------------------------  Cardiac Enzymes Recent Labs  Lab 09/20/17 0201  TROPONINI <0.03   ------------------------------------------------------------------------------------------------------------------  RADIOLOGY:  No results found.  EKG:   Orders placed or performed during the hospital encounter of 09/20/17  . ED EKG  .  ED EKG    ASSESSMENT AND PLAN:   37 year old female with no significant past medical history presents to hospital secondary to worsening abdominal pain and noted to have acute pancreatitis.  1.  Acute pancreatitis-secondary to hypertriglyceridemia.  Patient not aware of the diagnosis. -No alcohol consumption or no new medications. -Triglycerides are improving- off insulin drip -GI has been consulted.  On  liquid diet.  Elevating well.  Change  to carb controlled soft diet today -Continue IV fluids -Due to worsening abdominal pain and spiking fevers-started on Zosyn.  The inflammatory reaction.  Will hold off on repeating CT as fevers and pain not improving  2.  Hypomagnesemia and hypophosphatemia- replaced appropriately  3.  Hyponatremia- initially pseudohyponatremia from elevated triglycerides.  Improving, continue IV fluids for 1 more day  4.  Hypertriglyceridemia-started on simvastatin, fenofibrate and niacin  5.  New onset diabetes mellitus-A1c of 7.2.   - started glipizide.  6. DVT Prophylaxis- SQ heparin- change to lovenox   If Better, discharge tomorrow.   All the records are reviewed and case discussed with Care Management/Social Workerr. Management plans discussed with the patient, family and they are in agreement.  CODE STATUS: Full code  TOTAL TIME TAKING CARE OF THIS PATIENT: 37 minutes.   POSSIBLE D/C TOMORROW, DEPENDING ON CLINICAL CONDITION.   Enid BaasKALISETTI,Behr Cislo M.D on 09/23/2017 at 10:45 AM  Between 7am to 6pm - Pager - [phone]  After 6pm go to [email or web address] - Social research officer, governmentpassword EPAS [facility]  [facility] [place] [facility]  Office  [phone]  CC: Primary care physician; [facility], [facility]

## 2017-09-23 NOTE — Progress Notes (Signed)
Inpatient Diabetes Program Recommendations  AACE/ADA: New Consensus Statement on Inpatient Glycemic Control (2015)  Target Ranges:  Prepandial:   less than 140 mg/dL      Peak postprandial:   less than 180 mg/dL (1-2 hours)      Critically ill patients:  140 - 180 mg/dL   Lab Results  Component Value Date   GLUCAP 187 (H) 09/23/2017   HGBA1C 7.2 (H) 09/21/2017    Review of Glycemic ControlResults for Claudia NimrodLEGRIA MORALES, Dannielle (MRN [id number]) as of 09/23/2017 15:14  Ref. Range 09/22/2017 20:30 09/23/2017 00:50 09/23/2017 08:11 09/23/2017 12:26  Glucose-Capillary Latest Ref Range: 65 - 99 mg/dL 409136 (H) 811173 (H) 914174 (H) 187 (H)    Spoke with patient, sister and niece in depth about new diagnosis of DM.  Patient states that her mother has DM and that she has checked her blood sugar with her meter in the past.  We discussed her A1C and the importance of close follow-up with PCP regarding diabetes.  Patient works in [employer] and states that she exercises all day every day.  We briefly discussed foods/drinks that raise blood sugars and that carbohydrates turn into sugar.  Patient has "spanish" Living well with DM booklet at bedside. She states she prefers to read in spanish.  Discussed normal blood sugars, monitoring, complications of DM, and lifestyle modifications.  We also discussed that MD will be starting DM medication that causes the pancreas to put out more insulin. Therefore she must eat with this medication to prevent low blood sugars.  Reviewed signs and symptoms of low blood sugars and how to treat.  Patient was able to teach back.  Again reinforced importance of follow-up with MD after hospitalization.  Wrote down Reli-on meter for patient that can be purchased at [employer]. Patient verbalized understanding and had no further questions.   Thanks,  Beryl MeagerJenny Jamyson Jirak, RN, BC-ADM Inpatient Diabetes Coordinator Pager [phone] (8a-5p)

## 2017-09-24 LAB — MAGNESIUM: Magnesium: 1.8 mg/dL (ref 1.7–2.4)

## 2017-09-24 LAB — GLUCOSE, CAPILLARY: Glucose-Capillary: 165 mg/dL — ABNORMAL HIGH (ref 65–99)

## 2017-09-24 LAB — CALCIUM, IONIZED

## 2017-09-24 LAB — BASIC METABOLIC PANEL
ANION GAP: 6 (ref 5–15)
BUN: 5 mg/dL — AB (ref 6–20)
CO2: 26 mmol/L (ref 22–32)
Calcium: 8.1 mg/dL — ABNORMAL LOW (ref 8.9–10.3)
Chloride: 95 mmol/L — ABNORMAL LOW (ref 101–111)
Creatinine, Ser: 0.46 mg/dL (ref 0.44–1.00)
GFR calc Af Amer: >60 mL/min (ref >60)
GFR calc non Af Amer: >60 mL/min (ref >60)
GLUCOSE: 186 mg/dL — AB (ref 65–99)
POTASSIUM: 4 mmol/L (ref 3.5–5.1)
Sodium: 127 mmol/L — ABNORMAL LOW (ref 135–145)

## 2017-09-24 MED ORDER — ROSUVASTATIN CALCIUM 40 MG PO TABS: 40.0000 mg | ORAL_TABLET | Freq: Every day | ORAL | 0 refills | Status: DC

## 2017-09-24 MED ORDER — HYDROMORPHONE HCL 2 MG PO TABS: 4.0000 mg | ORAL_TABLET | ORAL | Status: DC | PRN

## 2017-09-24 MED ORDER — GLIPIZIDE ER 5 MG PO TB24: 5.0000 mg | ORAL_TABLET | Freq: Every day | ORAL | 0 refills | Status: DC

## 2017-09-24 MED ORDER — HYDROMORPHONE HCL 2 MG PO TABS
2.0000 mg | ORAL_TABLET | ORAL | Status: DC | PRN
  Administered 2017-09-24: 2 mg via ORAL
  Filled 2017-09-24: qty 1

## 2017-09-24 MED ORDER — FENOFIBRATE 160 MG PO TABS: 160.0000 mg | ORAL_TABLET | Freq: Every day | ORAL | 0 refills | Status: DC

## 2017-09-24 MED ORDER — NIACIN ER 500 MG PO TBCR: 500.0000 mg | EXTENDED_RELEASE_TABLET | Freq: Every day | ORAL | 0 refills | Status: DC

## 2017-09-24 NOTE — Care Management Note (Signed)
Case Management Note  Patient Details  Name: Claudia Cannon MRN: [id number] Date of Birth: 07/27/1980  Subjective/Objective:   Met with patient at bedside to discuss discharge plan. Patient uninsured. Spanish application given for [facility], also gave patient a Reli On glucometer and explained to her that she could get further supplies at [employer]. Email sent to Lorie Holt with [facility] referral . Patient very appreciative. No further needs identified.                 Action/Plan:   Expected Discharge Date:  09/24/17               Expected Discharge Plan:  Home/Self Care  In-House Referral:     Discharge planning Services  CM Consult, [facility]igent [facility]  Post Acute Care Choice:    Choice offered to:     DME Arranged:    DME Agency:     HH Arranged:    HH Agency:     Status of Service:  Completed, signed off  If discussed at Long Length of Stay Meetings, dates discussed:    Additional Comments:  Lisa M Jacobs, RN 09/24/2017, 11:32 AM  

## 2017-09-24 NOTE — Plan of Care (Signed)
  Problem: Education: Goal: Knowledge of General Education information will improve Outcome: Adequate for Discharge   Problem: Clinical Measurements: Goal: Will remain free from infection Outcome: Adequate for Discharge   Problem: Health Behavior/Discharge Planning: Goal: Ability to manage health-related needs will improve Outcome: Adequate for Discharge

## 2017-09-24 NOTE — Progress Notes (Signed)
Patient is being discharge home today self, alert and oriented, PRN ppain med administer once this am and effective, discharge instruction provided, Iv removed, tele removed. Patient to follow up with PCP as per MD recommendation, All apt reviewed, and diabetic teaching, patient verbalized understanding of teaching

## 2017-09-24 NOTE — Discharge Instructions (Signed)
Diabetes Insipidus Diabetes insipidus (DI) is a rare condition that causes the body to produce more urine than normal, which leads to thirst and dehydration. The urine is made mostly of water (dilute urine). There are four types of DI:  Central DI. This is the most common type.  Dipsogenic DI.  Nephrogenic DI.  Pregnancy-related DI.  The most common forms are related to decreased production of the hormone that regulates urine production (antidiuretic hormone) or resistance to this hormone. DI can be managed with treatment and is not usually serious. This condition is not related to type 1 or type 2 diabetes mellitus, in which blood sugar (glucose) levels are too high. DI affects mostly adults, but it can happen at any age. What are the causes? Central DI is caused by damage to the pituitary gland or hypothalamus in the brain. Dipsogenic DI is caused by a defect in the thirst mechanism in the brain. This defect causes you to drink too much fluid. These may result from:  Brain surgery.  Infection.  Inflammation.  Brain tumor.  Head injury.  Nephrogenic DI is caused by the kidneys not responding to the antidiuretic hormone in the body. This may result from:  Chronic kidney disease (CKD).  Certain medicines, such as lithium.  Low potassium levels.  High calcium levels.  Pregnancy-related DI is caused by the antidiuretic hormone not working properly in the body. This results from having a temporary form of diabetes mellitus that develops during pregnancy (gestational diabetes mellitus). What are the signs or symptoms? Symptoms of this condition include:  Excessive urination. This means urinating more than 10 cups (2.4 L) during a period of 24 hours.  Excessive thirst.  Frequent nighttime urination (nocturia).  Nausea.  Diarrhea.  How is this diagnosed? This condition may be diagnosed based on:  Your medical history.  A physical exam.  Blood tests.  Urine  tests.  How is this treated? Once your specific type of diabetes insipidus is diagnosed, treatment may include one or more of the following:  Increasing or limiting your fluid intake.  Taking medicines that contain artificial (synthetic) versions of the antidiuretic hormone.  Stopping certain medicines that you take.  Correcting the balance of minerals (electrolytes) in your body.  Changing your diet. You may be put on a low-protein or low-sodium diet.  You may need to visit your health care provider regularly to make sure your condition is being treated properly. You may also need to work with providers who specialize in:  Kidney problems (nephrologist).  Hormone disorders (endocrinologist).  Follow these instructions at home:  Follow instructions from your health care provider about how much fluid and water to drink. You may be directed to drink more fluids and water, or to limit how much fluid and water you drink.  Follow instructions from your health care provider about eating or drinking restrictions.  Take over-the-counter and prescription medicines only as told by your health care provider.  Return to your normal activities as told by your health care provider. Ask your health care provider what activities are safe for you.  If directed, monitor your risk of dehydration in extreme heat.  Keep all follow-up visits as told by your health care provider. This is important.  Carry a medical alert card or wear medical alert jewelry. Contact a health care provider if:  You continue to have symptoms after treatment. Get help right away if:  You have extreme thirst.  You have symptoms of severe dehydration, such as   rapid heart rate, muscle cramps, or confusion. Summary  Diabetes insipidus (DI) is a rare condition that causes the body to produce more urine than normal, which leads to thirst and dehydration.  Follow instructions from your health care provider about eating  or drinking restrictions.  Treatment may include increasing or limiting your fluid intake and correcting the balance of minerals (electrolytes) in your body.  Get help right away if you have symptoms of severe dehydration, such as rapid heart rate, muscle cramps, or confusion. This information is not intended to replace advice given to you by your health care provider. Make sure you discuss any questions you have with your health care provider. Document Released: 06/01/2013 Document Revised: 03/05/2016 Document Reviewed: 02/26/2016 Elsevier Interactive Patient Education  2018 Elsevier Inc.  

## 2017-09-25 LAB — CULTURE, BLOOD (ROUTINE X 2)
CULTURE: NO GROWTH
CULTURE: NO GROWTH
SPECIAL REQUESTS: ADEQUATE
Special Requests: ADEQUATE

## 2017-09-27 LAB — CULTURE, BLOOD (ROUTINE X 2)
CULTURE: NO GROWTH
Culture: NO GROWTH
Special Requests: ADEQUATE
Special Requests: ADEQUATE

## 2017-09-27 NOTE — Discharge Summary (Signed)
[facility] [place] [facility]   PATIENT NAME: Claudia NimrodMargarita Alegria Cannon    MR#:  [id number]  DATE OF BIRTH:  22-Apr-1981  DATE OF ADMISSION:  09/20/2017   ADMITTING PHYSICIAN: Enid Baasadhika Kalisetti, MD  DATE OF DISCHARGE: 09/24/2017 12:46 PM  PRIMARY CARE PHYSICIAN: [facility]   ADMISSION DIAGNOSIS:  Hyperlipidemia, unspecified hyperlipidemia type [E78.5] Other acute pancreatitis, unspecified complication status [K85.80] DISCHARGE DIAGNOSIS:  Active Problems:   Other acute pancreatitis without necrosis or infection   Hypertriglyceridemia   Hyperglycemia without ketosis   Acute pancreatitis  SECONDARY DIAGNOSIS:  History reviewed. No pertinent past medical history. HOSPITAL COURSE:  37 year old female with no significant past medical history admitted secondary to worsening abdominal pain and noted to have acute pancreatitis.  1.  Acute pancreatitis-secondary to hypertriglyceridemia.  Patient not aware of the diagnosis. -No alcohol consumption or no new medications. -Triglycerides improved some with insulin drip  2.  Hypomagnesemia and hypophosphatemia- replaced appropriately  3.  Hyponatremia- initially pseudohyponatremia from elevated triglycerides.  Improved with hydration  4.  Hypertriglyceridemia-started on simvastatin, fenofibrate and niacin  5.  New onset diabetes mellitus-A1c of 7.2.   - started glipizide. Patient would like to avoid insulin if at all possible considering lack of insurance. DISCHARGE CONDITIONS:  stable CONSULTS OBTAINED:  Treatment Team:  Barbaraann RondoSridharan, Prasanna, MD Annett Fabianichards, Karol A, MD DRUG ALLERGIES:  No Known Allergies DISCHARGE MEDICATIONS:   Allergies as of 09/24/2017   No Known Allergies     Medication List    STOP taking these medications   valACYclovir 1000 MG tablet Commonly known as:  VALTREX     TAKE these medications   fenofibrate 160 MG tablet Take 1 tablet (160 mg  total) by mouth daily.   glipiZIDE 5 MG 24 hr tablet Commonly known as:  GLUCOTROL XL Take 1 tablet (5 mg total) by mouth daily with breakfast.   niacin 500 MG tablet Commonly known as:  SLO-NIACIN Take 1 tablet (500 mg total) by mouth at bedtime.   rosuvastatin 40 MG tablet Commonly known as:  CRESTOR Take 1 tablet (40 mg total) by mouth daily at 6 PM.        DISCHARGE INSTRUCTIONS:   DIET:  Diabetic diet DISCHARGE CONDITION:  Stable ACTIVITY:  Activity as tolerated OXYGEN:  Home Oxygen: No.  Oxygen Delivery: room air DISCHARGE LOCATION:  home   If you experience worsening of your admission symptoms, develop shortness of breath, life threatening emergency, suicidal or homicidal thoughts you must seek medical attention immediately by calling 911 or calling your MD immediately  if symptoms less severe.  You Must read complete instructions/literature along with all the possible adverse reactions/side effects for all the Medicines you take and that have been prescribed to you. Take any new Medicines after you have completely understood and accpet all the possible adverse reactions/side effects.   Please note  You were cared for by a hospitalist during your hospital stay. If you have any questions about your discharge medications or the care you received while you were in the hospital after you are discharged, you can call the unit and asked to speak with the hospitalist on call if the hospitalist that took care of you is not available. Once you are discharged, your primary care physician will handle any further medical issues. Please note that NO REFILLS for any discharge medications will be authorized once you are discharged, as it is imperative that you return to your primary care physician (or  establish a relationship with a primary care physician if you do not have one) for your aftercare needs so that they can reassess your need for medications and monitor your lab  values.    On the day of Discharge:  VITAL SIGNS:  Blood pressure 105/73, pulse (!) 108, temperature 99.3 F (37.4 C), temperature source Oral, resp. rate 16, height 5\' 5"  (1.651 m), weight 74.1 kg (163 lb 5.8 oz), SpO2 97 %. PHYSICAL EXAMINATION:  GENERAL:  37 y.o.-year-old patient lying in the bed with no acute distress.  EYES: Pupils equal, round, reactive to light and accommodation. No scleral icterus. Extraocular muscles intact.  HEENT: Head atraumatic, normocephalic. Oropharynx and nasopharynx clear.  NECK:  Supple, no jugular venous distention. No thyroid enlargement, no tenderness.  LUNGS: Normal breath sounds bilaterally, no wheezing, rales,rhonchi or crepitation. No use of accessory muscles of respiration.  CARDIOVASCULAR: S1, S2 normal. No murmurs, rubs, or gallops.  ABDOMEN: Soft, non-tender, non-distended. Bowel sounds present. No organomegaly or mass.  EXTREMITIES: No pedal edema, cyanosis, or clubbing.  NEUROLOGIC: Cranial nerves II through XII are intact. Muscle strength 5/5 in all extremities. Sensation intact. Gait not checked.  PSYCHIATRIC: The patient is alert and oriented x 3.  SKIN: No obvious rash, lesion, or ulcer.  DATA REVIEW:   CBC Recent Labs  Lab 09/21/17 0430  WBC 12.0*  HGB 11.7*  HCT 36.8  PLT 223    Chemistries  Recent Labs  Lab 09/22/17 1730  09/24/17 0527  NA  --    < > 127*  K  --    < > 4.0  CL  --    < > 95*  CO2  --    < > 26  GLUCOSE  --    < > 186*  BUN  --    < > 5*  CREATININE  --    < > 0.46  CALCIUM  --    < > 8.1*  MG  --   --  1.8  AST 29  --   --   ALT 40  --   --   ALKPHOS 65  --   --   BILITOT 2.3*  --   --    < > = values in this interval not displayed.     Follow-up Information    [facility]. Schedule an appointment as soon as possible for a visit in 6 day(s).   Specialty:  Primary Care Contact information: [address] [place]  [address] [phone]       Raj Janus, MD. Schedule an appointment as soon as possible for a visit in 1 week.   Specialty:  Endocrinology Why:  new diabetic office to call with appointment Contact information: [address] [facility] [place] [address] [phone]           Management plans discussed with the patient, family and they are in agreement.  CODE STATUS: Prior   TOTAL TIME TAKING CARE OF THIS PATIENT: 45 minutes.    Delfino Lovett M.D on 09/27/2017 at 9:42 AM  Between 7am to 6pm - Pager - [phone]  After 6pm go to [email or web address] - Scientist, research (life sciences) [facility]  Office  [phone]  CC: Primary care physician; [facility]   Note: This dictation was prepared with Dragon dictation along with smaller phrase technology. Any transcriptional errors that result from this process are unintentional.

## 2017-09-29 ENCOUNTER — Telehealth: Payer: Self-pay

## 2017-09-29 NOTE — Telephone Encounter (Signed)
EMMI Follow-up: Report showed patient had a question about discharge papers.  I talked with Ms. Claudia Cannon and she did have her discharge paperwork and had read through them.  She was aware of follow-up appointments and had no needs today.  Let her know she would receive one more automated call and to let us know if she has any change in condition or concerns at that time.

## 2017-11-17 ENCOUNTER — Ambulatory Visit: Payer: Self-pay | Admitting: Gastroenterology

## 2017-12-03 ENCOUNTER — Ambulatory Visit: Payer: Medicaid Other | Admitting: Gastroenterology

## 2017-12-03 ENCOUNTER — Encounter: Payer: Self-pay | Admitting: *Deleted

## 2017-12-03 ENCOUNTER — Encounter: Payer: Self-pay | Admitting: Gastroenterology

## 2017-12-03 ENCOUNTER — Encounter (INDEPENDENT_AMBULATORY_CARE_PROVIDER_SITE_OTHER): Payer: Self-pay

## 2017-12-03 VITALS — BP 105/67 | HR 88 | Wt 150.8 lb

## 2017-12-03 DIAGNOSIS — K858 Other acute pancreatitis without necrosis or infection: Secondary | ICD-10-CM | POA: Diagnosis not present

## 2017-12-03 NOTE — Patient Instructions (Signed)
Dyslipidemia Dyslipidemia is an imbalance of waxy, fat-like substances (lipids) in the blood. The body needs lipids in small amounts. Dyslipidemia often involves a high level of cholesterol or triglycerides, which are types of lipids. Common forms of dyslipidemia include:  High levels of bad cholesterol (LDL cholesterol). LDL is the type of cholesterol that causes fatty deposits (plaques) to build up in the blood vessels that carry blood away from your heart (arteries).  Low levels of good cholesterol (HDL cholesterol). HDL cholesterol is the type of cholesterol that protects against heart disease. High levels of HDL remove the LDL buildup from arteries.  High levels of triglycerides. Triglycerides are a fatty substance in the blood that is linked to a buildup of plaques in the arteries.  You can develop dyslipidemia because of the genes you are born with (primary dyslipidemia) or changes that occur during your life (secondary dyslipidemia), or as a side effect of certain medical treatments. What are the causes? Primary dyslipidemia is caused by changes (mutations) in genes that are passed down through families (inherited). These mutations cause several types of dyslipidemia. Mutations can result in disorders that make the body produce too much LDL cholesterol or triglycerides, or not enough HDL cholesterol. These disorders may lead to heart disease, arterial disease, or stroke at an early age. Causes of secondary dyslipidemia include certain lifestyle choices and diseases that lead to dyslipidemia, such as:  Eating a diet that is high in animal fat.  Not getting enough activity or exercise (having a sedentary lifestyle).  Having diabetes, kidney disease, liver disease, or thyroid disease.  Drinking large amounts of alcohol.  Using certain types of drugs.  What increases the risk? You may be at greater risk for dyslipidemia if you are an older man or if you are a woman who has gone through  menopause. Other risk factors include:  Having a family history of dyslipidemia.  Taking certain medicines, including birth control pills, steroids, some diuretics, beta-blockers, and some medicines forHIV.  Smoking cigarettes.  Eating a high-fat diet.  Drinking large amounts of alcohol.  Having certain medical conditions such as diabetes, polycystic ovary syndrome (PCOS), pregnancy, kidney disease, liver disease, or hypothyroidism.  Not exercising regularly.  Being overweight or obese with too much belly fat.  What are the signs or symptoms? Dyslipidemia does not usually cause any symptoms. Very high lipid levels can cause fatty bumps under the skin (xanthomas) or a white or gray ring around the black center (pupil) of the eye. Very high triglyceride levels can cause inflammation of the pancreas (pancreatitis). How is this diagnosed? Your health care provider may diagnose dyslipidemia based on a routine blood test (fasting blood test). Because most people do not have symptoms of the condition, this blood testing (lipid profile) is done on adults age 40 and older and is repeated every 5 years. This test checks:  Total cholesterol. This is a measure of the total amount of cholesterol in your blood, including LDL cholesterol, HDL cholesterol, and triglycerides. A healthy number is below 200.  LDL cholesterol. The target number for LDL cholesterol is different for each person, depending on individual risk factors. For most people, a number below 100 is healthy. Ask your health care provider what your LDL cholesterol number should be.  HDL cholesterol. An HDL level of 60 or higher is best because it helps to protect against heart disease. A number below 42 for men or below 39 for women increases the risk for heart disease.  Triglycerides. A  healthy triglyceride number is below 150.  If your lipid profile is abnormal, your health care provider may do other blood tests to get more  information about your condition. How is this treated? Treatment depends on the type of dyslipidemia that you have and your other risk factors for heart disease and stroke. Your health care provider will have a target range for your lipid levels based on this information. For many people, treatment starts with lifestyle changes, such as diet and exercise. Your health care provider may recommend that you:  Get regular exercise.  Make changes to your diet.  Quit smoking if you smoke.  If diet changes and exercise do not help you reach your goals, your health care provider may also prescribe medicine to lower lipids. The most commonly prescribed type of medicine lowers your LDL cholesterol (statin drug). If you have a high triglyceride level, your provider may prescribe another type of drug (fibrate) or an omega-3 fish oil supplement, or both. Follow these instructions at home:  Take over-the-counter and prescription medicines only as told by your health care provider. This includes supplements.  Get regular exercise. Start an aerobic exercise and strength training program as told by your health care provider. Ask your health care provider what activities are safe for you. Your health care provider may recommend: ? 30 minutes of aerobic activity 4-6 days a week. Brisk walking is an example of aerobic activity. ? Strength training 2 days a week.  Eat a healthy diet as told by your health care provider. This can help you reach and maintain a healthy weight, lower your LDL cholesterol, and raise your HDL cholesterol. It may help to work with a diet and nutrition specialist (dietitian) to make a plan that is right for you. Your dietitian or health care provider may recommend: ? Limiting your calories, if you are overweight. ? Eating more fruits, vegetables, whole grains, fish, and lean meats. ? Limiting saturated fat, trans fat, and cholesterol.  Follow instructions from your health care provider  or dietitian about eating or drinking restrictions.  Limit alcohol intake to no more than one drink per day for nonpregnant women and two drinks per day for men. One drink equals 12 oz of beer, 5 oz of wine, or 1 oz of hard liquor.  Do not use any products that contain nicotine or tobacco, such as cigarettes and e-cigarettes. If you need help quitting, ask your health care provider.  Keep all follow-up visits as told by your health care provider. This is important. Contact a health care provider if:  You are having trouble sticking to your exercise or diet plan.  You are struggling to quit smoking or control your use of alcohol. Summary  Dyslipidemia is an imbalance of waxy, fat-like substances (lipids) in the blood. The body needs lipids in small amounts. Dyslipidemia often involves a high level of cholesterol or triglycerides, which are types of lipids.  Treatment depends on the type of dyslipidemia that you have and your other risk factors for heart disease and stroke.  For many people, treatment starts with lifestyle changes, such as diet and exercise. Your health care provider may also prescribe medicine to lower lipids. This information is not intended to replace advice given to you by your health care provider. Make sure you discuss any questions you have with your health care provider. Document Released: 06/01/2013 Document Revised: 01/22/2016 Document Reviewed: 01/22/2016 Elsevier Interactive Patient Education  2018 [employer] Acute Pancreatitis Acute pancreatitis is a  condition in which the pancreas suddenly gets irritated and swollen (has inflammation). The pancreas is a large gland behind the stomach. It makes enzymes that help to digest food. The pancreas also makes hormones that help to control your blood sugar. Acute pancreatitis happens when the enzymes attack the pancreas and damage it. Most attacks last a couple of days and can cause serious problems. Follow these  instructions at home: Eating and drinking  Follow instructions from your doctor about diet. You may need to: ? Avoid alcohol. ? Limit how much fat is in your diet.  Eat small meals often. Avoid eating big meals.  Drink enough fluid to keep your pee (urine) clear or pale yellow.  Do not drink alcohol if it caused your condition. General instructions  Take over-the-counter and prescription medicines only as told by your doctor.  Do not use any tobacco products. These include cigarettes, chewing tobacco, and e-cigarettes. If you need help quitting, ask your doctor.  Get plenty of rest.  If directed, check your blood sugar at home as told by your doctor.  Keep all follow-up visits as told by your doctor. This is important. Contact a doctor if:  You do not get better as quickly as expected.  You have new symptoms.  Your symptoms get worse.  You have lasting pain or weakness.  You continue to feel sick to your stomach (nauseous).  You get better and then you have another pain attack.  You have a fever. Get help right away if:  You cannot eat or keep fluids down.  Your pain becomes very bad.  Your skin or the white part of your eyes turns yellow (jaundice).  You throw up (vomit).  You feel dizzy or you pass out (faint).  Your blood sugar is high (over 300 mg/dL). This information is not intended to replace advice given to you by your health care provider. Make sure you discuss any questions you have with your health care provider. Document Released: 11/13/2007 Document Revised: 11/02/2015 Document Reviewed: 02/28/2015 Elsevier Interactive Patient Education  2018 [employer].

## 2017-12-03 NOTE — Progress Notes (Addendum)
Melodie Bouillon, MD [address]  [address]  [place], [place] [address]  Main: [phone]  Fax: [phone]   Primary Care Physician: [facility]  Primary Gastroenterologist:  Dr. Melodie Bouillon  Chief Complaint  Patient presents with  . Follow-up    hospital visit    HPI: Claudia Cannon is a 37 y.o. female here for follow-up of posthospitalization for hypertriglyceridemia induced acute pancreatitis.  Patient was admitted in April 2019 for this, and other causes of pancreas that is ruled out.  Patient's abdominal pain has completely resolved, she is tolerating oral diet without difficulty, no altered bowel habits. The patient denies abdominal or flank pain, anorexia, nausea or vomiting, dysphagia, change in bowel habits or black or bloody stools or weight loss.    She is following with her primary care provider closely, and she states they have done blood work and are following her triglyceride levels.No family history of pancreatic or colon cancer.  No prior endoscopies.    Current Outpatient Medications  Medication Sig Dispense Refill  . rosuvastatin (CRESTOR) 40 MG tablet Take 1 tablet (40 mg total) by mouth daily at 6 PM. 30 tablet 0   No current facility-administered medications for this visit.     Allergies as of 12/03/2017  . (No Known Allergies)    ROS:  General: Negative for anorexia, weight loss, fever, chills, fatigue, weakness. ENT: Negative for hoarseness, difficulty swallowing , nasal congestion. CV: Negative for chest pain, angina, palpitations, dyspnea on exertion, peripheral edema.  Respiratory: Negative for dyspnea at rest, dyspnea on exertion, cough, sputum, wheezing.  GI: See history of present illness. GU:  Negative for dysuria, hematuria, urinary incontinence, urinary frequency, nocturnal urination.  Endo: Negative for unusual weight change.    Physical Examination:   BP 105/67   Pulse 88   Wt  150 lb 12.8 oz (68.4 kg)   BMI 25.09 kg/m   General: Well-nourished, well-developed in no acute distress.  Eyes: No icterus. Conjunctivae pink. Mouth: Oropharyngeal mucosa moist and pink , no lesions erythema or exudate. Neck: Supple, Trachea midline Abdomen: Bowel sounds are normal, nontender, nondistended, no hepatosplenomegaly or masses, no abdominal bruits or hernia , no rebound or guarding.   Extremities: No lower extremity edema. No clubbing or deformities. Neuro: Alert and oriented x 3.  Grossly intact. Skin: Warm and dry, no jaundice.   Psych: Alert and cooperative, normal mood and affect.   Labs: CMP     Component Value Date/Time   NA 127 (L) 09/24/2017 0527   K 4.0 09/24/2017 0527   CL 95 (L) 09/24/2017 0527   CO2 26 09/24/2017 0527   GLUCOSE 186 (H) 09/24/2017 0527   BUN 5 (L) 09/24/2017 0527   CREATININE 0.46 09/24/2017 0527   CALCIUM 8.1 (L) 09/24/2017 0527   PROT 5.9 (L) 09/22/2017 1730   ALBUMIN 2.7 (L) 09/22/2017 1730   AST 29 09/22/2017 1730   ALT 40 09/22/2017 1730   ALKPHOS 65 09/22/2017 1730   BILITOT 2.3 (H) 09/22/2017 1730   GFRNONAA >60 09/24/2017 0527   GFRAA >60 09/24/2017 0527   Lab Results  Component Value Date   WBC 12.0 (H) 09/21/2017   HGB 11.7 (L) 09/21/2017   HCT 36.8 09/21/2017   MCV 89.5 09/21/2017   PLT 223 09/21/2017    Imaging Studies: No results found.  Assessment and Plan:   Claudia Cannon is a 37 y.o. y/o female here for follow-up of acute pancreatitis from hypertriglyceridemia in April  2019, currently asymptomatic  We will try to obtain the recent lab work that she states she had done by her PCP Triglyceride goal to be less than 200 at all times Continue Crestor the patient is on via PCP  If triglycerides are at goal, patient can continue to follow-up closely with her primary care provider, and follow-up with us as needed  After obtaining records from her primary care provider, if other lab work needs to be  done, we can order it after reviewing those  Educated to avoid over-the-counter, or herbal supplements without talking to her primary care provider first Educated to avoid smoking, and alcohol as well.  No complications of pancreatitis seen during hospital stay, no complications present clinically at this time.  Addendum: Records obtained from primary care provider.  Recent labs done on June 26 showed triglycerides of 152.  As planned, patient continue follow-up with primary care provider.  They have patient on rosuvastatin, and are monitoring her triglycerides.  Dr Melodie BouillonVarnita Tahiliani

## 2017-12-16 ENCOUNTER — Encounter: Payer: Self-pay | Admitting: Family Medicine

## 2018-05-26 ENCOUNTER — Ambulatory Visit: Payer: Medicaid Other | Admitting: Gastroenterology

## 2018-06-08 ENCOUNTER — Ambulatory Visit: Payer: Medicaid Other | Admitting: Gastroenterology

## 2018-07-08 ENCOUNTER — Ambulatory Visit: Payer: Medicaid Other | Admitting: Gastroenterology

## 2018-07-27 ENCOUNTER — Ambulatory Visit: Payer: Medicaid Other | Admitting: Gastroenterology

## 2018-07-27 ENCOUNTER — Other Ambulatory Visit: Payer: Self-pay

## 2018-07-27 ENCOUNTER — Encounter: Payer: Self-pay | Admitting: Gastroenterology

## 2018-07-27 VITALS — BP 112/70 | HR 94 | Ht 60.0 in | Wt 155.8 lb

## 2018-07-27 DIAGNOSIS — K859 Acute pancreatitis without necrosis or infection, unspecified: Secondary | ICD-10-CM

## 2018-07-28 NOTE — Progress Notes (Signed)
Melodie Bouillon, MD [address]  [address]  [place], [place] [address]  Main: [phone]  Fax: [phone]   Primary Care Physician: [facility]   Chief Complaint  Patient presents with  . Follow-up    pancreatitis    HPI: Claudia Cannon is a 38 y.o. female here for follow-up of pancreatitis which occurred due to hypertriglyceridemia.  Patient doing very well with no abdominal pain.  Good appetite. The patient denies abdominal or flank pain, anorexia, nausea or vomiting, dysphagia, change in bowel habits or black or bloody stools or weight loss.  She is on rosuvastatin by primary care provider and her last triglyceride level was 152 and is being managed by her PCP.  She states she has a follow-up appointment with them soon and they plan on repeating her triglyceride levels.  She does not take any over-the-counter or herbal products and does not drink alcohol or smoke.  Current Outpatient Medications  Medication Sig Dispense Refill  . rosuvastatin (CRESTOR) 40 MG tablet Take 1 tablet (40 mg total) by mouth daily at 6 PM. 30 tablet 0   No current facility-administered medications for this visit.     Allergies as of 07/27/2018  . (No Known Allergies)    ROS:  General: Negative for anorexia, weight loss, fever, chills, fatigue, weakness. ENT: Negative for hoarseness, difficulty swallowing , nasal congestion. CV: Negative for chest pain, angina, palpitations, dyspnea on exertion, peripheral edema.  Respiratory: Negative for dyspnea at rest, dyspnea on exertion, cough, sputum, wheezing.  GI: See history of present illness. GU:  Negative for dysuria, hematuria, urinary incontinence, urinary frequency, nocturnal urination.  Endo: Negative for unusual weight change.    Physical Examination:   BP 112/70   Pulse 94   Ht 5' (1.524 m)   Wt 155 lb 12.8 oz (70.7 kg)   BMI 30.43 kg/m   General: Well-nourished, well-developed in no  acute distress.  Eyes: No icterus. Conjunctivae pink. Mouth: Oropharyngeal mucosa moist and pink , no lesions erythema or exudate. Neck: Supple, Trachea midline Abdomen: Bowel sounds are normal, nontender, nondistended, no hepatosplenomegaly or masses, no abdominal bruits or hernia , no rebound or guarding.   Extremities: No lower extremity edema. No clubbing or deformities. Neuro: Alert and oriented x 3.  Grossly intact. Skin: Warm and dry, no jaundice.   Psych: Alert and cooperative, normal mood and affect.   Labs: CMP     Component Value Date/Time   NA 127 (L) 09/24/2017 0527   K 4.0 09/24/2017 0527   CL 95 (L) 09/24/2017 0527   CO2 26 09/24/2017 0527   GLUCOSE 186 (H) 09/24/2017 0527   BUN 5 (L) 09/24/2017 0527   CREATININE 0.46 09/24/2017 0527   CALCIUM 8.1 (L) 09/24/2017 0527   PROT 5.9 (L) 09/22/2017 1730   ALBUMIN 2.7 (L) 09/22/2017 1730   AST 29 09/22/2017 1730   ALT 40 09/22/2017 1730   ALKPHOS 65 09/22/2017 1730   BILITOT 2.3 (H) 09/22/2017 1730   GFRNONAA >60 09/24/2017 0527   GFRAA >60 09/24/2017 0527   Lab Results  Component Value Date   WBC 12.0 (H) 09/21/2017   HGB 11.7 (L) 09/21/2017   HCT 36.8 09/21/2017   MCV 89.5 09/21/2017   PLT 223 09/21/2017    Imaging Studies: No results found.  Assessment and Plan:   Claudia Cannon is a 38 y.o. y/o female here for follow-up of pancreatitis due to hypertriglyceridemia  Patient symptoms have completely resolved  Her hypertriglyceridemia has resolved with Crestor This is being managed by primary care provider appropriately She has an upcoming appointment with them and I have encouraged her to follow-up with them and discussed importance of taking her medications to avoid future episodes of hypertriglyceridemia and she verbalized understanding.  I have also asked her to continue to abstain from alcohol, smoking, herbal products or supplements and she verbalized understanding.    Dr Melodie Bouillon

## 2018-09-17 ENCOUNTER — Other Ambulatory Visit: Payer: Self-pay

## 2018-09-17 ENCOUNTER — Encounter: Payer: Self-pay | Admitting: Emergency Medicine

## 2018-09-17 DIAGNOSIS — Z79899 Other long term (current) drug therapy: Secondary | ICD-10-CM | POA: Insufficient documentation

## 2018-09-17 DIAGNOSIS — R002 Palpitations: Secondary | ICD-10-CM | POA: Insufficient documentation

## 2018-09-17 DIAGNOSIS — R42 Dizziness and giddiness: Secondary | ICD-10-CM | POA: Diagnosis not present

## 2018-09-17 DIAGNOSIS — R0602 Shortness of breath: Secondary | ICD-10-CM | POA: Diagnosis present

## 2018-09-17 NOTE — ED Triage Notes (Addendum)
Patient ambulatory to triage with steady gait, without difficulty or distress noted; pt reports onset SHOB, dizziness, and sensation of heart beating fast; denies symptoms at present, st symptoms resolved upon arrival; denies any recent illness

## 2018-09-18 ENCOUNTER — Emergency Department: Payer: Medicaid Other

## 2018-09-18 ENCOUNTER — Other Ambulatory Visit: Payer: Self-pay

## 2018-09-18 ENCOUNTER — Emergency Department
Admission: EM | Admit: 2018-09-18 | Discharge: 2018-09-18 | Disposition: A | Discharge status: Home / Self Care | Payer: Medicaid Other | Attending: Emergency Medicine | Admitting: Emergency Medicine

## 2018-09-18 DIAGNOSIS — R42 Dizziness and giddiness: Secondary | ICD-10-CM

## 2018-09-18 DIAGNOSIS — R0602 Shortness of breath: Secondary | ICD-10-CM

## 2018-09-18 DIAGNOSIS — R002 Palpitations: Secondary | ICD-10-CM

## 2018-09-18 LAB — COMPREHENSIVE METABOLIC PANEL
ALT: 47 U/L — ABNORMAL HIGH (ref 0–44)
AST: 28 U/L (ref 15–41)
Albumin: 4.2 g/dL (ref 3.5–5.0)
Alkaline Phosphatase: 85 U/L (ref 38–126)
Anion gap: 8 (ref 5–15)
BUN: 14 mg/dL (ref 6–20)
CO2: 25 mmol/L (ref 22–32)
Calcium: 9.5 mg/dL (ref 8.9–10.3)
Chloride: 105 mmol/L (ref 98–111)
Creatinine, Ser: 0.47 mg/dL (ref 0.44–1.00)
GFR calc Af Amer: >60 mL/min (ref >60)
GFR calc non Af Amer: >60 mL/min (ref >60)
Glucose, Bld: 223 mg/dL — ABNORMAL HIGH (ref 70–99)
Potassium: 3.7 mmol/L (ref 3.5–5.1)
Sodium: 138 mmol/L (ref 135–145)
Total Bilirubin: 0.8 mg/dL (ref 0.3–1.2)
Total Protein: 7.8 g/dL (ref 6.5–8.1)

## 2018-09-18 LAB — CBC WITH DIFFERENTIAL/PLATELET
Abs Immature Granulocytes: 0.02 10*3/uL (ref 0.00–0.07)
Basophils Absolute: 0.1 10*3/uL (ref 0.0–0.1)
Basophils Relative: 1 %
Eosinophils Absolute: 0.3 10*3/uL (ref 0.0–0.5)
Eosinophils Relative: 3 %
HCT: 40.9 % (ref 36.0–46.0)
Hemoglobin: 13.9 g/dL (ref 12.0–15.0)
Immature Granulocytes: 0 %
Lymphocytes Relative: 42 %
Lymphs Abs: 4.6 10*3/uL — ABNORMAL HIGH (ref 0.7–4.0)
MCH: 29.4 pg (ref 26.0–34.0)
MCHC: 34 g/dL (ref 30.0–36.0)
MCV: 86.7 fL (ref 80.0–100.0)
Monocytes Absolute: 0.7 10*3/uL (ref 0.1–1.0)
Monocytes Relative: 6 %
Neutro Abs: 5.4 10*3/uL (ref 1.7–7.7)
Neutrophils Relative %: 48 %
Platelets: 291 10*3/uL (ref 150–400)
RBC: 4.72 MIL/uL (ref 3.87–5.11)
RDW: 11.9 % (ref 11.5–15.5)
WBC: 11.1 10*3/uL — ABNORMAL HIGH (ref 4.0–10.5)
nRBC: 0 % (ref 0.0–0.2)

## 2018-09-18 LAB — URINALYSIS, COMPLETE (UACMP) WITH MICROSCOPIC
Bacteria, UA: NONE SEEN
Bilirubin Urine: NEGATIVE
Glucose, UA: 50 mg/dL — AB
Hgb urine dipstick: NEGATIVE
Ketones, ur: NEGATIVE mg/dL
Leukocytes,Ua: NEGATIVE
Nitrite: NEGATIVE
Protein, ur: NEGATIVE mg/dL
Specific Gravity, Urine: 1.004 — ABNORMAL LOW (ref 1.005–1.030)
WBC, UA: NONE SEEN WBC/hpf (ref 0–5)
pH: 6 (ref 5.0–8.0)

## 2018-09-18 LAB — POCT PREGNANCY, URINE: Preg Test, Ur: NEGATIVE

## 2018-09-18 LAB — TROPONIN I: Troponin I: <0.03 ng/mL (ref <0.03)

## 2018-09-18 LAB — T4, FREE: Free T4: 0.94 ng/dL (ref 0.82–1.77)

## 2018-09-18 LAB — TSH: TSH: 3.931 u[IU]/mL (ref 0.350–4.500)

## 2018-09-18 MED ORDER — SODIUM CHLORIDE 0.9 % IV BOLUS
500.0000 mL | Freq: Once | INTRAVENOUS | Status: AC
  Administered 2018-09-18: 500 mL via INTRAVENOUS

## 2018-09-18 NOTE — ED Notes (Signed)
POC Urine Pregnancy test result= NEGATIVE

## 2018-09-18 NOTE — ED Notes (Signed)
Radiology to bedside at this time. 

## 2018-09-18 NOTE — Discharge Instructions (Addendum)
Please decrease your caffeine intake and do not drink energy drinks.  Return to the ER for worsening symptoms, persistent vomiting, difficulty breathing or other concerns.

## 2018-09-18 NOTE — ED Notes (Signed)
PT ambulatory to toilet with steady gate. No SOB noted

## 2018-10-01 NOTE — ED Provider Notes (Signed)
[facility] Emergency Department Provider Note   ____________________________________________   First MD Initiated Contact with Patient 09/18/18 [phone]     (approximate)  I have reviewed the triage vital signs and the nursing notes.   HISTORY  Chief Complaint Shortness of Breath and Dizziness  History obtained via [employer] Spanish interpreter  HPI Claudia Cannon is a 38 y.o. female who presents to the ED from home with a chief complaint of dizziness, palpitations, shortness of breath.  States she was watching TV when she had onset of the symptoms which lasted just a few minutes.  All symptoms have resolved upon her arrival.  No history of same.  Patient denies recent fever, cough, chest pain, domino pain, nausea, vomiting, diarrhea.  Denies recent travel, trauma, OCP use or exposure to persons diagnosed with coronavirus.  Does report drinking 2 cups of coffee daily or an energy drink.       Past medical history Pancreatitis Hyperlipidemia Diabetes  Patient Active Problem List   Diagnosis Date Noted  . Other acute pancreatitis without necrosis or infection 09/20/2017  . Hypertriglyceridemia 09/20/2017  . Hyperglycemia without ketosis 09/20/2017  . Acute pancreatitis 09/20/2017    History reviewed. No pertinent surgical history.  Prior to Admission medications   Medication Sig Start Date End Date Taking? Authorizing Provider  rosuvastatin (CRESTOR) 40 MG tablet Take 1 tablet (40 mg total) by mouth daily at 6 PM. 09/24/17   Delfino Lovett, MD    Allergies Patient has no known allergies.  Family History  Problem Relation Age of Onset  . Diabetes Mother   . Hypertension Mother   . Hyperlipidemia Mother   . Chronic Renal Failure Mother     Social History Social History   Tobacco Use  . Smoking status: Never Smoker  . Smokeless tobacco: Never Used  Substance Use Topics  . Alcohol use: No    Frequency: Never  . Drug use: No     Review of Systems  Constitutional: No fever/chills Eyes: No visual changes. ENT: No sore throat. Cardiovascular: Positive for palpitations.  Denies chest pain. Respiratory: Positive for shortness of breath. Gastrointestinal: No abdominal pain.  No nausea, no vomiting.  No diarrhea.  No constipation. Genitourinary: Negative for dysuria. Musculoskeletal: Negative for back pain. Skin: Negative for rash. Neurological: Positive for dizziness.  Negative for headaches, focal weakness or numbness.   ____________________________________________   PHYSICAL EXAM:  VITAL SIGNS: ED Triage Vitals  Enc Vitals Group     BP 09/17/18 2359 (!) 149/89     Pulse Rate 09/17/18 2359 90     Resp 09/17/18 2359 17     Temp 09/17/18 2359 97.6 F (36.4 C)     Temp Source 09/17/18 2359 Oral     SpO2 09/17/18 2359 98 %     Weight 09/17/18 2357 155 lb (70.3 kg)     Height 09/17/18 2357 5' (1.524 m)     Head Circumference --      Peak Flow --      Pain Score 09/17/18 2357 0     Pain Loc --      Pain Edu? --      Excl. in GC? --     Constitutional: Alert and oriented. Well appearing and in no acute distress. Eyes: Conjunctivae are normal. PERRL. EOMI. Head: Atraumatic. Nose: No congestion/rhinnorhea. Mouth/Throat: Mucous membranes are moist.  Oropharynx non-erythematous. Neck: No stridor.  No thyromegaly. Cardiovascular: Normal rate, regular rhythm. Grossly normal heart sounds.  Good  peripheral circulation. Respiratory: Normal respiratory effort.  No retractions. Lungs CTAB. Gastrointestinal: Soft and nontender. No distention. No abdominal bruits. No CVA tenderness. Musculoskeletal: No lower extremity tenderness nor edema.  No joint effusions. Neurologic:  Normal speech and language. No gross focal neurologic deficits are appreciated. No gait instability. Skin:  Skin is warm, dry and intact. No rash noted. Psychiatric: Mood and affect are normal. Speech and behavior are normal.   ____________________________________________   LABS (all labs ordered are listed, but only abnormal results are displayed)  Labs Reviewed  CBC WITH DIFFERENTIAL/PLATELET - Abnormal; Notable for the following components:      Result Value   WBC 11.1 (*)    Lymphs Abs 4.6 (*)    All other components within normal limits  COMPREHENSIVE METABOLIC PANEL - Abnormal; Notable for the following components:   Glucose, Bld 223 (*)    ALT 47 (*)    All other components within normal limits  URINALYSIS, COMPLETE (UACMP) WITH MICROSCOPIC - Abnormal; Notable for the following components:   Color, Urine COLORLESS (*)    APPearance CLEAR (*)    Specific Gravity, Urine 1.004 (*)    Glucose, UA 50 (*)    All other components within normal limits  TROPONIN I  TSH  T4, FREE  POCT PREGNANCY, URINE   ____________________________________________  EKG  ED ECG REPORT I, SUNG,JADE J, the attending physician, personally viewed and interpreted this ECG.   Date: 09/18/2018  EKG Time: 0006  Rate: 89  Rhythm: normal EKG, normal sinus rhythm  Axis: Normal  Intervals:none  ST&T Change: Nonspecific  ____________________________________________  RADIOLOGY  ED MD interpretation: No acute cardiopulmonary process  Official radiology report(s): No results found.  ____________________________________________   PROCEDURES  Procedure(s) performed (including Critical Care):  Procedures   ____________________________________________   INITIAL IMPRESSION / ASSESSMENT AND PLAN / ED COURSE  As part of my medical decision making, I reviewed the following data within the electronic MEDICAL RECORD NUMBER Nursing notes reviewed and incorporated, Interpreter needed, Labs reviewed, EKG interpreted, Old chart reviewed, Radiograph reviewed and Notes from prior ED visits        38 year old female presenting with palpitations, shortness of breath and dizziness; all symptoms have resolved. Differential  includes, but is not limited to, viral syndrome, bronchitis including COPD exacerbation, pneumonia, reactive airway disease including asthma, CHF including exacerbation with or without pulmonary/interstitial edema, pneumothorax, ACS, thoracic trauma, and pulmonary embolism.  Kamini Nist was evaluated in Emergency Department on 10/01/2018 for the symptoms described in the history of present illness. She was evaluated in the context of the global COVID-19 pandemic, which necessitated consideration that the patient might be at risk for infection with the SARS-CoV-2 virus that causes COVID-19. Institutional protocols and algorithms that pertain to the evaluation of patients at risk for COVID-19 are in a state of rapid change based on information released by regulatory bodies including the CDC and federal and state organizations. These policies and algorithms were followed during the patient's care in the ED.  Patient reports blood sugar usually below 200 but admits she had a bowl of sugary cereal tonight.  Will initiate IV fluid resuscitation, check thyroid function.   Clinical Course as of Sep 30 553  Fri Sep 18, 2018  0242 Patient resting in no acute distress.  Updated her of all test results.  Strict return precautions given.  Patient verbalizes understanding and agrees with plan of care.   [JS]    Clinical Course User Index [JS] Chiquita Loth  J, MD     ____________________________________________   FINAL CLINICAL IMPRESSION(S) / ED DIAGNOSES  Final diagnoses:  Palpitations  Dizziness  Shortness of breath     ED Discharge Orders    None       Note:  This document was prepared using Dragon voice recognition software and may include unintentional dictation errors.   Irean HongSung, Jade J, MD 10/01/18 [phone]

## 2019-05-04 ENCOUNTER — Encounter: Payer: Self-pay | Admitting: *Deleted

## 2019-05-04 ENCOUNTER — Other Ambulatory Visit: Payer: Self-pay

## 2019-05-04 ENCOUNTER — Emergency Department: Payer: Medicaid Other

## 2019-05-04 ENCOUNTER — Emergency Department
Admission: EM | Admit: 2019-05-04 | Discharge: 2019-05-05 | Disposition: A | Discharge status: Home / Self Care | Payer: Medicaid Other | Attending: Emergency Medicine | Admitting: Emergency Medicine

## 2019-05-04 DIAGNOSIS — R1013 Epigastric pain: Secondary | ICD-10-CM | POA: Diagnosis not present

## 2019-05-04 DIAGNOSIS — M546 Pain in thoracic spine: Secondary | ICD-10-CM | POA: Diagnosis not present

## 2019-05-04 DIAGNOSIS — Z79899 Other long term (current) drug therapy: Secondary | ICD-10-CM | POA: Diagnosis not present

## 2019-05-04 NOTE — ED Triage Notes (Signed)
Pt reports while at rest today she had onset of mid back pain. Pain is worse with deep breath. Feels like it is hard to take a deep breath and sometimes feels a pressure in the epigastric area.  She denies that she has had a cough or fevers. She took ibuprofen for the pain with minimal relief. No nausea.

## 2019-05-05 ENCOUNTER — Emergency Department: Payer: Medicaid Other

## 2019-05-05 ENCOUNTER — Other Ambulatory Visit: Payer: Self-pay

## 2019-05-05 LAB — URINALYSIS, ROUTINE W REFLEX MICROSCOPIC
Bacteria, UA: NONE SEEN
Bilirubin Urine: NEGATIVE
Glucose, UA: NEGATIVE mg/dL
Ketones, ur: NEGATIVE mg/dL
Leukocytes,Ua: NEGATIVE
Nitrite: NEGATIVE
Protein, ur: 30 mg/dL — AB
Specific Gravity, Urine: 1.031 — ABNORMAL HIGH (ref 1.005–1.030)
pH: 5 (ref 5.0–8.0)

## 2019-05-05 LAB — FIBRIN DERIVATIVES D-DIMER (ARMC ONLY): Fibrin derivatives D-dimer (ARMC): 285.95 ng/mL (FEU) (ref 0.00–499.00)

## 2019-05-05 LAB — CBC WITH DIFFERENTIAL/PLATELET
Abs Immature Granulocytes: 0.02 10*3/uL (ref 0.00–0.07)
Basophils Absolute: 0 10*3/uL (ref 0.0–0.1)
Basophils Relative: 0 %
Eosinophils Absolute: 0.2 10*3/uL (ref 0.0–0.5)
Eosinophils Relative: 2 %
HCT: 38.9 % (ref 36.0–46.0)
Hemoglobin: 13.2 g/dL (ref 12.0–15.0)
Immature Granulocytes: 0 %
Lymphocytes Relative: 47 %
Lymphs Abs: 5.1 10*3/uL — ABNORMAL HIGH (ref 0.7–4.0)
MCH: 28.8 pg (ref 26.0–34.0)
MCHC: 33.9 g/dL (ref 30.0–36.0)
MCV: 84.9 fL (ref 80.0–100.0)
Monocytes Absolute: 0.7 10*3/uL (ref 0.1–1.0)
Monocytes Relative: 6 %
Neutro Abs: 4.9 10*3/uL (ref 1.7–7.7)
Neutrophils Relative %: 45 %
Platelets: 295 10*3/uL (ref 150–400)
RBC: 4.58 MIL/uL (ref 3.87–5.11)
RDW: 12.7 % (ref 11.5–15.5)
WBC: 10.9 10*3/uL — ABNORMAL HIGH (ref 4.0–10.5)
nRBC: 0 % (ref 0.0–0.2)

## 2019-05-05 LAB — COMPREHENSIVE METABOLIC PANEL
ALT: 23 U/L (ref 0–44)
AST: 17 U/L (ref 15–41)
Albumin: 4 g/dL (ref 3.5–5.0)
Alkaline Phosphatase: 68 U/L (ref 38–126)
Anion gap: 10 (ref 5–15)
BUN: 19 mg/dL (ref 6–20)
CO2: 23 mmol/L (ref 22–32)
Calcium: 9.2 mg/dL (ref 8.9–10.3)
Chloride: 104 mmol/L (ref 98–111)
Creatinine, Ser: 0.43 mg/dL — ABNORMAL LOW (ref 0.44–1.00)
GFR calc Af Amer: >60 mL/min (ref >60)
GFR calc non Af Amer: >60 mL/min (ref >60)
Glucose, Bld: 144 mg/dL — ABNORMAL HIGH (ref 70–99)
Potassium: 3.5 mmol/L (ref 3.5–5.1)
Sodium: 137 mmol/L (ref 135–145)
Total Bilirubin: 1 mg/dL (ref 0.3–1.2)
Total Protein: 7.1 g/dL (ref 6.5–8.1)

## 2019-05-05 LAB — PREGNANCY, URINE: Preg Test, Ur: NEGATIVE

## 2019-05-05 LAB — LIPASE, BLOOD: Lipase: 24 U/L (ref 11–51)

## 2019-05-05 MED ORDER — ONDANSETRON HCL 4 MG/2ML IJ SOLN
4.0000 mg | INTRAMUSCULAR | Status: AC
  Administered 2019-05-05: 4 mg via INTRAVENOUS
  Filled 2019-05-05: qty 2

## 2019-05-05 MED ORDER — MORPHINE SULFATE (PF) 4 MG/ML IV SOLN
4.0000 mg | Freq: Once | INTRAVENOUS | Status: AC
  Administered 2019-05-05: 4 mg via INTRAVENOUS
  Filled 2019-05-05: qty 1

## 2019-05-05 MED ORDER — KETOROLAC TROMETHAMINE 30 MG/ML IJ SOLN
15.0000 mg | Freq: Once | INTRAMUSCULAR | Status: AC
  Administered 2019-05-05: 15 mg via INTRAVENOUS
  Filled 2019-05-05: qty 1

## 2019-05-05 NOTE — ED Provider Notes (Signed)
[facility] Emergency Department Provider Note  ____________________________________________   First MD Initiated Contact with Patient 05/05/19 0009     (approximate)  I have reviewed the triage vital signs and the nursing notes.   HISTORY  Chief Complaint Back Pain    HPI Claudia Cannon is a 38 y.o. female with medical history notable for acute pancreatitis secondary to hypertriglyceridemia but who reports she is currently well controlled on medication and had normal lab work about 2 months ago.  She presents tonight for acute onset moderate to severe dull pain in the middle of her back radiating through to her upper abdomen just below her ribs in the middle.  The pain has been constant for a few hours.  Nothing in particular makes it better and taking deep breaths makes it worse.  She denies nausea vomiting.  She has had no known COVID-19 contacts.   She has no history of blood clots in the legs of the lungs.  She denies fever/chills, sore throat, chest pain, shortness of breath, cough.  She is currently on her period and is having no dysuria and does not believe that she could be pregnant.        History reviewed. No pertinent past medical history.  Patient Active Problem List   Diagnosis Date Noted  . Other acute pancreatitis without necrosis or infection 09/20/2017  . Hypertriglyceridemia 09/20/2017  . Hyperglycemia without ketosis 09/20/2017  . Acute pancreatitis 09/20/2017    History reviewed. No pertinent surgical history.  Prior to Admission medications   Medication Sig Start Date End Date Taking? Authorizing Provider  rosuvastatin (CRESTOR) 40 MG tablet Take 1 tablet (40 mg total) by mouth daily at 6 PM. 09/24/17   Delfino Lovett, MD    Allergies Patient has no known allergies.  Family History  Problem Relation Age of Onset  . Diabetes Mother   . Hypertension Mother   . Hyperlipidemia Mother   . Chronic Renal Failure Mother      Social History Social History   Tobacco Use  . Smoking status: Never Smoker  . Smokeless tobacco: Never Used  Substance Use Topics  . Alcohol use: No    Frequency: Never  . Drug use: No    Review of Systems Constitutional: No fever/chills Eyes: No visual changes. ENT: No sore throat. Cardiovascular: Denies chest pain. Respiratory: Denies shortness of breath. Gastrointestinal: Acute onset pain in her epigastrium that may be radiating from the back.  No nausea nor vomiting. Genitourinary: Negative for dysuria.  Currently on menstrual cycle. Musculoskeletal: Acute onset dull pain in the middle of her back radiating to the epigastrium or vice versa. Integumentary: Negative for rash. Neurological: Negative for headaches, focal weakness or numbness.   ____________________________________________   PHYSICAL EXAM:  VITAL SIGNS: ED Triage Vitals  Enc Vitals Group     BP 05/04/19 2104 (!) 151/83     Pulse Rate 05/04/19 2104 89     Resp 05/04/19 2104 16     Temp 05/04/19 2104 98.6 F (37 C)     Temp Source 05/04/19 2104 Oral     SpO2 05/04/19 2104 99 %     Weight --      Height --      Head Circumference --      Peak Flow --      Pain Score 05/04/19 2105 8     Pain Loc --      Pain Edu? --      Excl.  in GC? --     Constitutional: Alert and oriented.  Appears uncomfortable but not in severe distress. Eyes: Conjunctivae are normal.  Head: Atraumatic. Nose: No congestion/rhinnorhea. Mouth/Throat: Patient is wearing a mask. Neck: No stridor.  No meningeal signs.   Cardiovascular: Normal rate, regular rhythm. Good peripheral circulation. Grossly normal heart sounds. Respiratory: Normal respiratory effort.  No retractions. Gastrointestinal: Soft and nondistended.  Moderate to severe epigastric tenderness to palpation with some guarding.  No appreciable tenderness to palpation of the right upper quadrant with negative Murphy sign.  No lower abdominal tenderness to  palpation and no rebound or guarding. Musculoskeletal: No CVA tenderness to percussion on either side.  No lower extremity tenderness nor edema. No gross deformities of extremities. Neurologic:  Normal speech and language. No gross focal neurologic deficits are appreciated.  Skin:  Skin is warm, dry and intact. Psychiatric: Mood and affect are normal. Speech and behavior are normal.  ____________________________________________   LABS (all labs ordered are listed, but only abnormal results are displayed)  Labs Reviewed  URINALYSIS, ROUTINE W REFLEX MICROSCOPIC - Abnormal; Notable for the following components:      Result Value   Color, Urine YELLOW (*)    APPearance HAZY (*)    Specific Gravity, Urine 1.031 (*)    Hgb urine dipstick MODERATE (*)    Protein, ur 30 (*)    All other components within normal limits  COMPREHENSIVE METABOLIC PANEL - Abnormal; Notable for the following components:   Glucose, Bld 144 (*)    Creatinine, Ser 0.43 (*)    All other components within normal limits  CBC WITH DIFFERENTIAL/PLATELET - Abnormal; Notable for the following components:   WBC 10.9 (*)    Lymphs Abs 5.1 (*)    All other components within normal limits  LIPASE, BLOOD  FIBRIN DERIVATIVES D-DIMER ([facility] ONLY)  PREGNANCY, URINE   ____________________________________________  EKG  ED ECG REPORT I, Loleta Roseory Ricky Gallery, the attending physician, personally viewed and interpreted this ECG.  Date: 05/05/2019 EKG Time: 00: 11 Rate: 64 Rhythm: normal sinus rhythm QRS Axis: normal Intervals: normal ST/T Wave abnormalities: Inverted T waves in lead III, otherwise unremarkable Narrative Interpretation: no evidence of acute ischemia  ____________________________________________  RADIOLOGY I, Loleta Roseory Karlissa Aron, personally viewed and evaluated these images (plain radiographs) as part of my medical decision making, as well as reviewing the written report by the radiologist.  ED MD interpretation: No  acute abnormalities identified on chest x-ray.  No acute abnormalities on U/S.  Official radiology report(s): Dg Chest 2 View  Result Date: 05/04/2019 CLINICAL DATA:  Chest pain EXAM: CHEST - 2 VIEW COMPARISON:  September 18, 2018 FINDINGS: The lungs are clear. The heart size and pulmonary vascularity are normal. No adenopathy. No pneumothorax. No bone lesions. IMPRESSION: No edema or consolidation. Electronically Signed   By: Bretta BangWilliam  Woodruff III M.D.   On: 05/04/2019 21:27   [place] Abdomen Limited Ruq  Result Date: 05/05/2019 CLINICAL DATA:  Epigastric pain for 1 day EXAM: ULTRASOUND ABDOMEN LIMITED RIGHT UPPER QUADRANT COMPARISON:  09/20/2017 FINDINGS: Gallbladder: No gallstones or wall thickening visualized. No sonographic Murphy sign noted by sonographer. Common bile duct: Diameter: 3 mm Liver: No focal lesion identified. Within normal limits in parenchymal echogenicity. Portal vein is patent on color Doppler imaging with normal direction of blood flow towards the liver. Other: None. IMPRESSION: No acute abnormality noted. Electronically Signed   By: Alcide CleverMark  Lukens M.D.   On: 05/05/2019 02:02    ____________________________________________   PROCEDURES  Procedure(s) performed (including Critical Care):  Procedures   ____________________________________________   INITIAL IMPRESSION / MDM / ASSESSMENT AND PLAN / ED COURSE  As part of my medical decision making, I reviewed the following data within the electronic MEDICAL RECORD NUMBER Nursing notes reviewed and incorporated, Labs reviewed , EKG interpreted , Old chart reviewed, Radiograph reviewed , Notes from prior ED visits and [place] Controlled Substance Database   Differential diagnosis includes, but is not limited to, biliary colic, pancreatitis, PE, ureteral colic, UTI/pyelonephritis, musculoskeletal strain.  AAA or aortic dissection very unlikely in this patient based on history, physical exam, vital signs, graphics.  Patient's vital signs  are stable and she is afebrile with no known COVID-19 contacts.  She is PERC negative but reporting some pain in her lower chest versus upper abdomen with increased pain with deep inspiration and I will check a D-dimer.  However she has substantial tenderness to palpation of the epigastrium with a negative Murphy sign and a history of acute pancreatitis due to hypertriglyceridemia.  Lab work is pending at this time and I will check a right upper quadrant ultrasound for further evaluation of the biliary system.  For her acute discomfort I am giving morphine 4 mg IV, Toradol 15 mg IV, and Zofran 4 mg IV.  EKG and chest x-ray are both reassuring.      Clinical Course as of May 05 227  Wed May 05, 2019  0119 Lipase: 24 [CF]  0119 Reassuring comprehensive metabolic panel with no acute abnormalities including no LFT elevation  Comprehensive metabolic panel(!) [CF]  0119 Normal D-dimer  Fibrin derivatives D-dimer [facility]): 285.95 [CF]  0119 Normal CBC with no substantial leukocytosis  CBC with Differential(!) [CF]  0130 Preg Test, Ur: NEGATIVE [CF]  0131 There is blood in the patient's urine but she is on her menstrual cycle which is likely the source.  No evidence of acute infection  Urinalysis, Routine w reflex microscopic(!) [CF]  0224 Reassuring ultrasound with no evidence of any gallbladder disease.  I reassessed the patient and she feels much better now.  I provided the reassuring results and she understands and agrees with the plan for discharge and outpatient follow-up and over-the-counter pain medicine as needed.  I gave my usual and customary return precautions.  US Abdomen Limited RUQ [CF]    Clinical Course User Index [CF] Loleta Rose, MD     ____________________________________________  FINAL CLINICAL IMPRESSION(S) / ED DIAGNOSES  Final diagnoses:  Epigastric pain  Acute thoracic back pain, unspecified back pain laterality     MEDICATIONS GIVEN DURING THIS VISIT:   Medications  morphine 4 MG/ML injection 4 mg (4 mg Intravenous Given 05/05/19 0111)  ketorolac (TORADOL) 30 MG/ML injection 15 mg (15 mg Intravenous Given 05/05/19 0111)  ondansetron (ZOFRAN) injection 4 mg (4 mg Intravenous Given 05/05/19 0110)     ED Discharge Orders    None      *Please note:  Pasha Gadison was evaluated in Emergency Department on 05/05/2019 for the symptoms described in the history of present illness. She was evaluated in the context of the global COVID-19 pandemic, which necessitated consideration that the patient might be at risk for infection with the SARS-CoV-2 virus that causes COVID-19. Institutional protocols and algorithms that pertain to the evaluation of patients at risk for COVID-19 are in a state of rapid change based on information released by regulatory bodies including the CDC and federal and state organizations. These policies and algorithms were followed during the patient's  care in the ED.  Some ED evaluations and interventions may be delayed as a result of limited staffing during the pandemic.*  Note:  This document was prepared using Dragon voice recognition software and may include unintentional dictation errors.   Hinda Kehr, MD 05/05/19 [phone]

## 2019-05-05 NOTE — Discharge Instructions (Signed)
Your workup in the Emergency Department today was reassuring.  We did not find any specific abnormalities.  We recommend you drink plenty of fluids, take your regular medications and/or any new ones prescribed today, and follow up with the doctor(s) listed in these documents as recommended.  Return to the Emergency Department if you develop new or worsening symptoms that concern you.  

## 2019-10-10 ENCOUNTER — Emergency Department
Admission: EM | Admit: 2019-10-10 | Discharge: 2019-10-10 | Disposition: A | Discharge status: Home / Self Care | Payer: Medicaid Other | Attending: Emergency Medicine | Admitting: Emergency Medicine

## 2019-10-10 ENCOUNTER — Other Ambulatory Visit: Payer: Self-pay

## 2019-10-10 ENCOUNTER — Encounter: Payer: Self-pay | Admitting: Emergency Medicine

## 2019-10-10 DIAGNOSIS — K0889 Other specified disorders of teeth and supporting structures: Secondary | ICD-10-CM | POA: Diagnosis present

## 2019-10-10 DIAGNOSIS — Z79899 Other long term (current) drug therapy: Secondary | ICD-10-CM | POA: Insufficient documentation

## 2019-10-10 MED ORDER — TRAMADOL HCL 50 MG PO TABS: 50.0000 mg | ORAL_TABLET | Freq: Four times a day (QID) | ORAL | 0 refills | Status: DC | PRN

## 2019-10-10 MED ORDER — AMOXICILLIN 875 MG PO TABS: 875.0000 mg | ORAL_TABLET | Freq: Two times a day (BID) | ORAL | 0 refills | Status: DC

## 2019-10-10 NOTE — ED Provider Notes (Signed)
[facility] [place] Emergency Department Provider Note   ____________________________________________   First MD Initiated Contact with Patient 10/10/19 1328     (approximate)  I have reviewed the triage vital signs and the nursing notes.   HISTORY  Chief Complaint Dental Pain   HPI Claudia Cannon is a 39 y.o. female presents to the ED with complaint of right lower dental pain.  Patient states that she had a feeling in her tooth on 09/06/2019.  She states that since that time she has continued to have pain on her right side.  She has been seen by her dentist once since that time and was told to take anti-inflammatories.  Patient states this is not helping with her pain.  She denies any known fever or chills.  Patient is a non-smoker.  She rates her pain as a 10/10.     History reviewed. No pertinent past medical history.  Patient Active Problem List   Diagnosis Date Noted  . Other acute pancreatitis without necrosis or infection 09/20/2017  . Hypertriglyceridemia 09/20/2017  . Hyperglycemia without ketosis 09/20/2017  . Acute pancreatitis 09/20/2017    History reviewed. No pertinent surgical history.  Prior to Admission medications   Medication Sig Start Date End Date Taking? Authorizing Provider  amoxicillin (AMOXIL) 875 MG tablet Take 1 tablet (875 mg total) by mouth 2 (two) times daily. 10/10/19   Tommi Rumps, PA-C  rosuvastatin (CRESTOR) 40 MG tablet Take 1 tablet (40 mg total) by mouth daily at 6 PM. 09/24/17   Delfino Lovett, MD  traMADol (ULTRAM) 50 MG tablet Take 1 tablet (50 mg total) by mouth every 6 (six) hours as needed. 10/10/19   Tommi Rumps, PA-C    Allergies Patient has no known allergies.  Family History  Problem Relation Age of Onset  . Diabetes Mother   . Hypertension Mother   . Hyperlipidemia Mother   . Chronic Renal Failure Mother     Social History Social History   Tobacco Use  . Smoking status: Never Smoker    . Smokeless tobacco: Never Used  Substance Use Topics  . Alcohol use: No  . Drug use: No    Review of Systems Constitutional: No fever/chills Eyes: No visual changes. ENT: No sore throat.  Positive dental pain. Cardiovascular: Denies chest pain. Respiratory: Denies shortness of breath. Gastrointestinal:   No nausea, no vomiting. Musculoskeletal: Negative for back pain. Skin: Negative for rash. Neurological: Negative for headaches, focal weakness or numbness. ___________________________________________   PHYSICAL EXAM:  VITAL SIGNS: ED Triage Vitals  Enc Vitals Group     BP 10/10/19 1315 137/84     Pulse Rate 10/10/19 1315 82     Resp 10/10/19 1315 16     Temp 10/10/19 1315 97.9 F (36.6 C)     Temp Source 10/10/19 1315 Oral     SpO2 10/10/19 1315 97 %     Weight 10/10/19 1319 150 lb (68 kg)     Height 10/10/19 1319 5' (1.524 m)     Head Circumference --      Peak Flow --      Pain Score 10/10/19 1319 10     Pain Loc --      Pain Edu? --      Excl. in GC? --     Constitutional: Alert and oriented. Well appearing and in no acute distress. Eyes: Conjunctivae are normal. PERRL. EOMI. Head: Atraumatic. Nose: No congestion/rhinnorhea. Mouth/Throat: Mucous membranes are moist.  Oropharynx  non-erythematous.  Right lower wisdom tooth with filling present.  Area is tender to palpation with tongue depressor.  No obvious drainage noted.  No facial edema present. Neck: No stridor.   Hematological/Lymphatic/Immunilogical: No cervical lymphadenopathy. Cardiovascular: Normal rate, regular rhythm. Grossly normal heart sounds.  Good peripheral circulation. Respiratory: Normal respiratory effort.  No retractions. Lungs CTAB. Musculoskeletal: Moves upper and lower extremities without any difficulty normal gait was noted. Neurologic:  Normal speech and language. No gross focal neurologic deficits are appreciated. No gait instability. Skin:  Skin is warm, dry and intact. No rash  noted. Psychiatric: Mood and affect are normal. Speech and behavior are normal.  ____________________________________________   LABS (all labs ordered are listed, but only abnormal results are displayed)  Labs Reviewed - No data to display  PROCEDURES  Procedure(s) performed (including Critical Care):  Procedures   ____________________________________________   INITIAL IMPRESSION / ASSESSMENT AND PLAN / ED COURSE  As part of my medical decision making, I reviewed the following data within the electronic MEDICAL RECORD NUMBER Notes from prior ED visits and [place] [employer] [place] was evaluated in Emergency Department on 10/10/2019 for the symptoms described in the history of present illness. She was evaluated in the context of the global COVID-19 pandemic, which necessitated consideration that the patient might be at risk for infection with the SARS-CoV-2 virus that causes COVID-19. Institutional protocols and algorithms that pertain to the evaluation of patients at risk for COVID-19 are in a state of rapid change based on information released by regulatory bodies including the CDC and federal and state organizations. These policies and algorithms were followed during the patient's care in the ED.  39 year old female presents to the ED with complaint of dental pain on the right lower molar.  Patient states that she had a filling done over a month ago and has continued to have pain in that area.  She has been seen once and told to take anti-inflammatories which she states has not helped.  Patient denies any fever or chills.  There is what appears to be a recent dental feeling in the right lower wisdom tooth.  No obvious abscess noted.  Patient was placed on amoxicillin 875 twice daily for 10 days and a prescription for tramadol as needed for pain.  She is encouraged to call her dentist for a recheck of this  area ____________________________________________   FINAL CLINICAL IMPRESSION(S) / ED DIAGNOSES  Final diagnoses:  Pain, dental     ED Discharge Orders         Ordered    amoxicillin (AMOXIL) 875 MG tablet  2 times daily     10/10/19 1340    traMADol (ULTRAM) 50 MG tablet  Every 6 hours PRN     10/10/19 1340           Note:  This document was prepared using Dragon voice recognition software and may include unintentional dictation errors.    Johnn Hai, PA-C 10/10/19 1425    Lavonia Drafts, MD 10/10/19 [phone]

## 2019-10-10 NOTE — ED Notes (Signed)
Pt having dental pain on the right side of her mouth after having a filling placed- pt states the medication they gave her did not help and that the pain has gotten worse

## 2019-10-10 NOTE — ED Triage Notes (Signed)
First nurse note-had recent filling to tooth and now having severe pain. Family reports they think they went to deep when doing filling. No dyspnea or dysphagia noted.

## 2019-10-10 NOTE — Discharge Instructions (Signed)
Call your dentist to make an appointment for follow-up.  Begin taking amoxicillin 875 twice daily for 10 days.  Tramadol is for pain.  Do not drive or operate machinery while taking the pain medication as it can make you drowsy.  If additional pain medication is needed you may take Tylenol or ibuprofen with this medicine.

## 2019-10-10 NOTE — ED Triage Notes (Signed)
Pt arrived via POV with reports of dental pain on the right side, pt had filling done over a month ago, states she has been having pain all on the right side of her face. She was taking anti-inflammatory medication but has run out.

## 2019-10-27 IMAGING — CT CT HEAD W/O CM
3 series · 16 of 46 positions shown, 19 images · non-contrast
Comparison: None.

CLINICAL DATA: Chronic headaches

EXAM:
CT HEAD WITHOUT CONTRAST
TECHNIQUE: Contiguous axial images were obtained from the base of the skull
through the vertex without intravenous contrast.

[Series 2: head wo · axial · 0.40mm/px · z∈[+569,+689]mm · 10 of 29 slices shown, 13 images]
[im 3/29  brain]
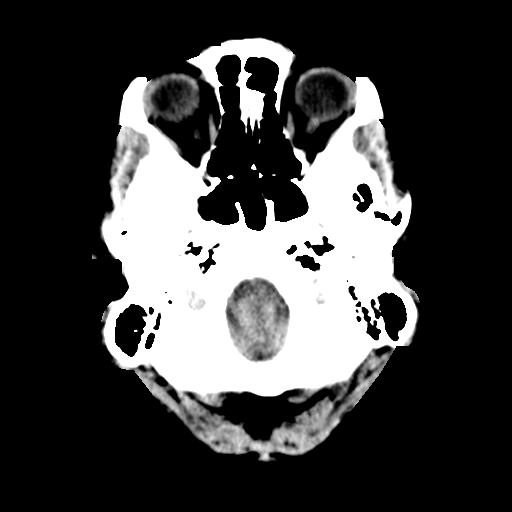
[im 3/29  bone]
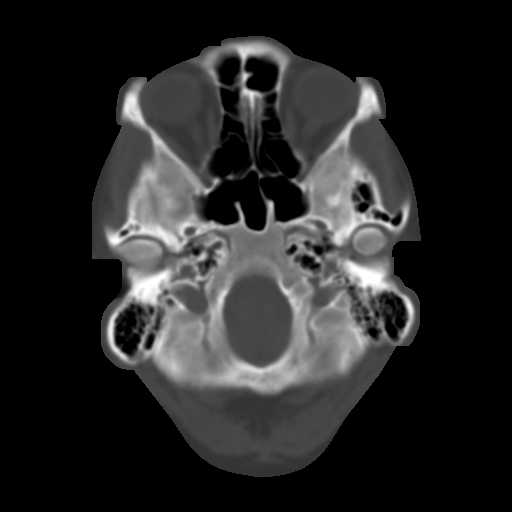
[im 6/29  brain]
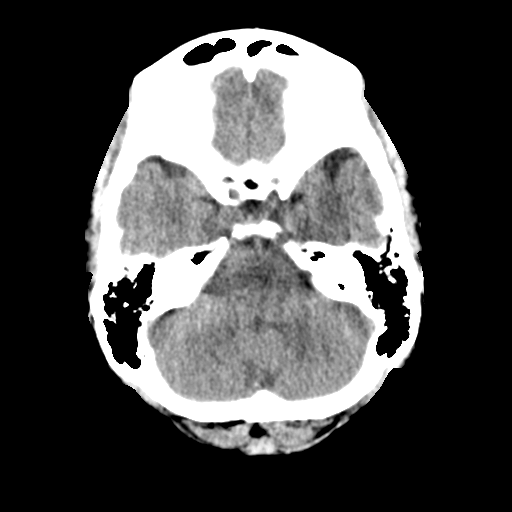
[im 8/29  brain]
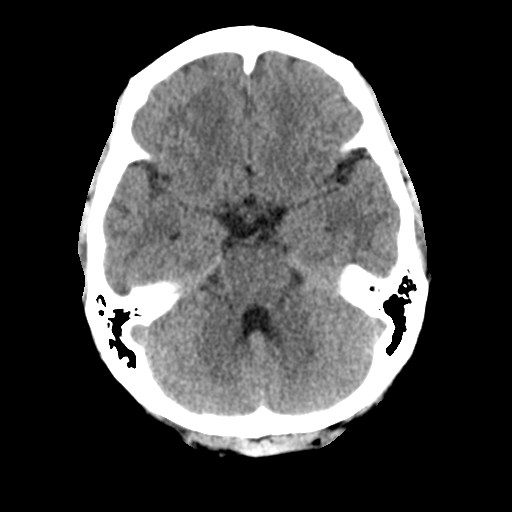
[im 11/29  brain]
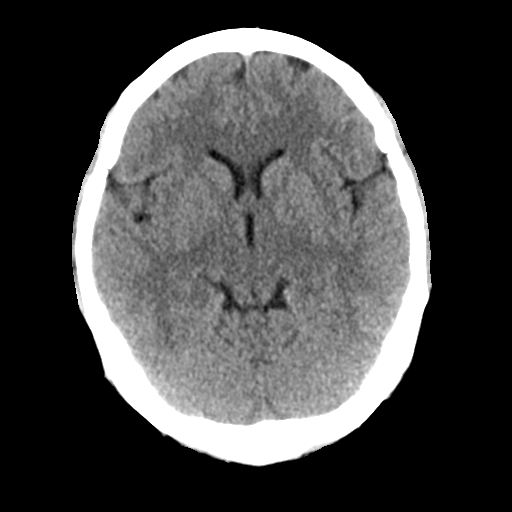
[im 14/29  brain]
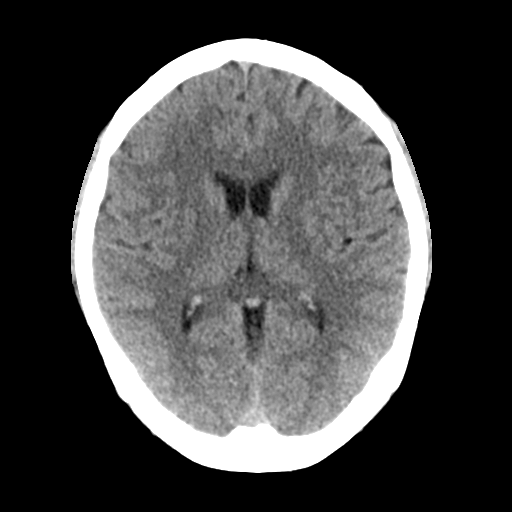
[im 14/29  bone]
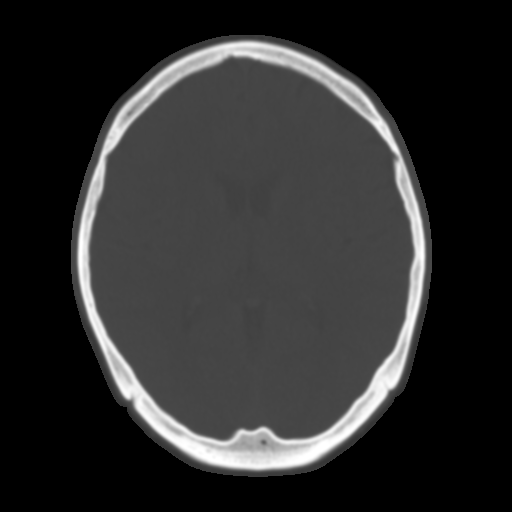
[im 16/29  brain]
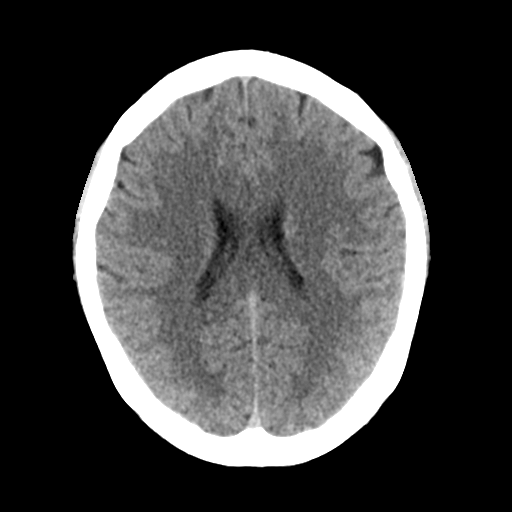
[im 19/29  brain]
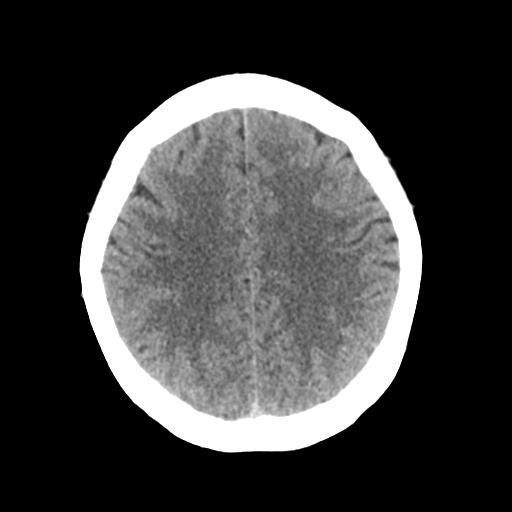
[im 22/29  brain]
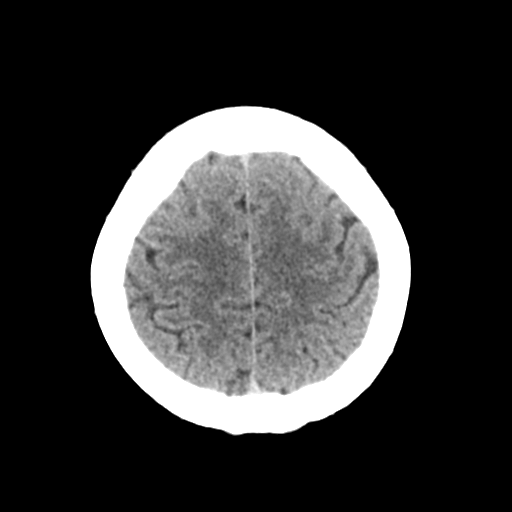
[im 24/29  brain]
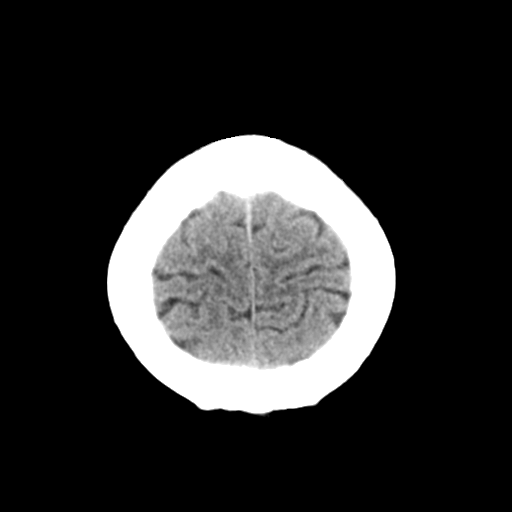
[im 24/29  bone]
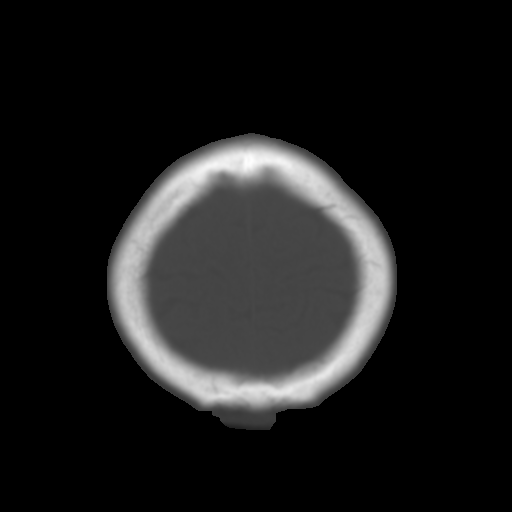
[im 27/29  brain]
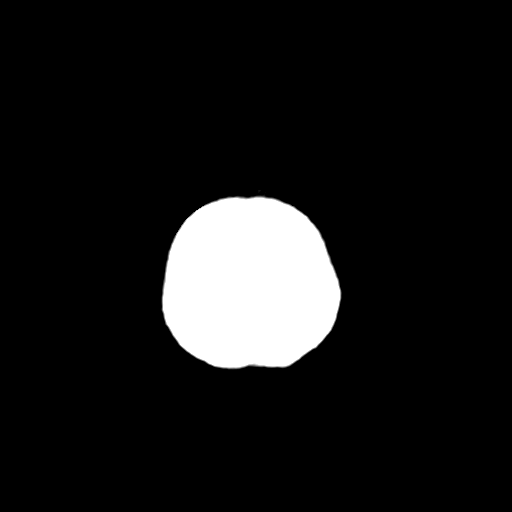

[Series 4: coronal soft tissue · coronal · 0.30mm/px · 3 of 62 slices shown]
[im 21/62  brain]
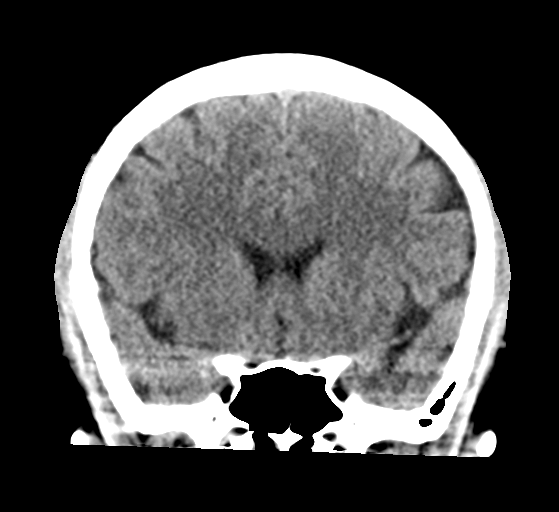
[im 28/62  brain]
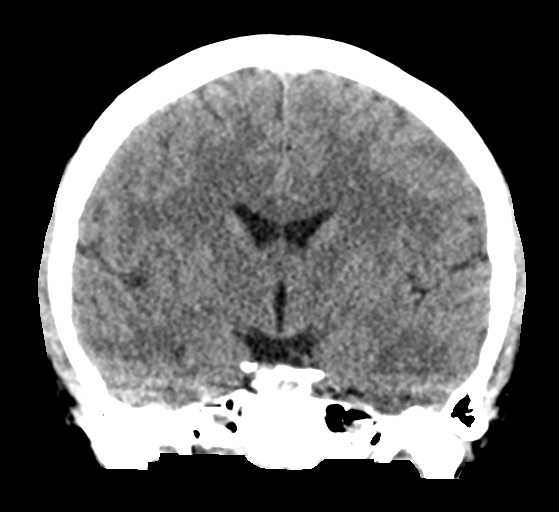
[im 34/62  brain]
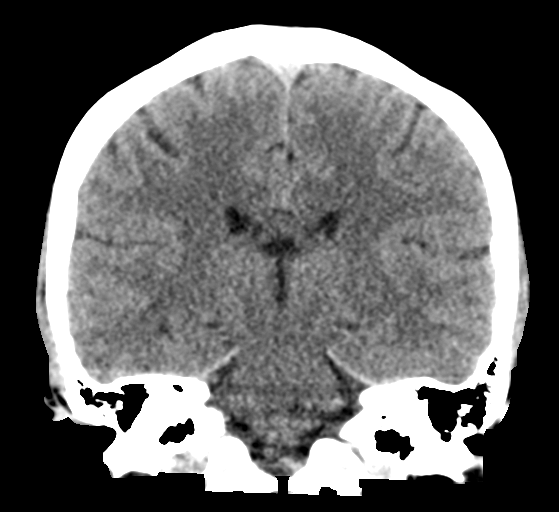

[Series 5: sagittal soft tissue · sagittal · 0.30mm/px · 3 of 54 slices shown]
[im 18/54  brain]
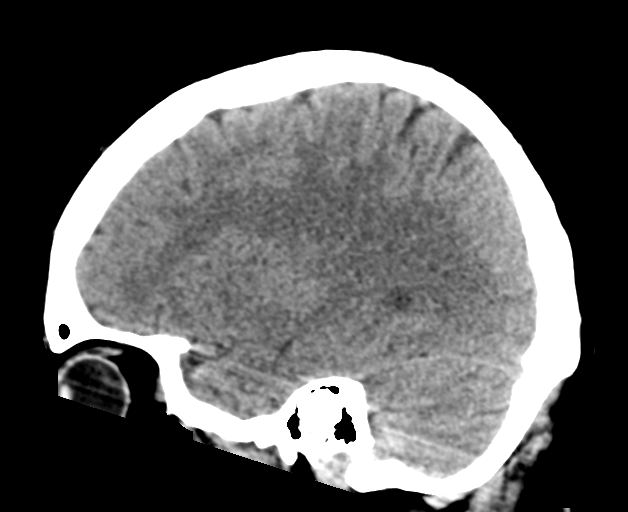
[im 27/54  brain]
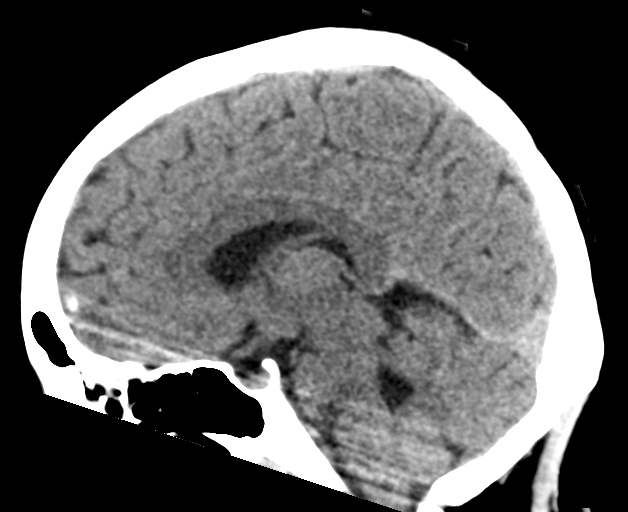
[im 36/54  brain]
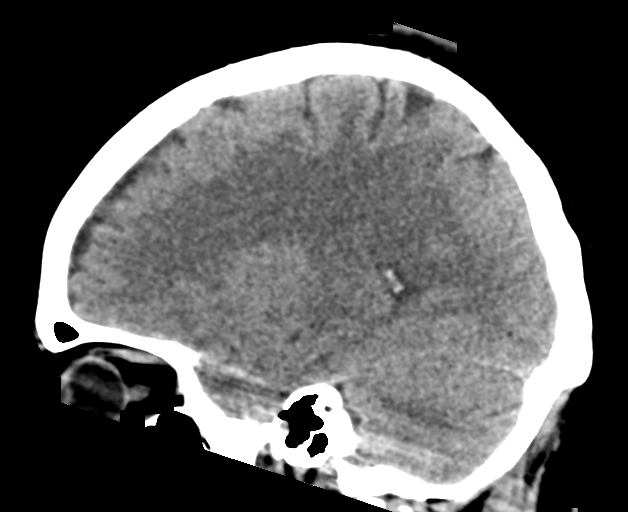

[16 of 46 positions shown; findings below may reference images not displayed]

FINDINGS: Brain: No evidence of acute infarction, hemorrhage, hydrocephalus,
extra-axial collection or mass lesion/mass effect.

Vascular: No hyperdense vessel or unexpected calcification.

Skull: Normal. Negative for fracture or focal lesion.

Sinuses/Orbits: No acute finding.

Other: None.
IMPRESSION: No acute intracranial abnormality noted.

## 2020-08-01 DIAGNOSIS — N978 Female infertility of other origin: Secondary | ICD-10-CM | POA: Insufficient documentation

## 2020-09-20 ENCOUNTER — Emergency Department
Admission: EM | Admit: 2020-09-20 | Discharge: 2020-09-20 | Disposition: A | Discharge status: Home / Self Care | Payer: Medicaid Other | Attending: Emergency Medicine | Admitting: Emergency Medicine

## 2020-09-20 ENCOUNTER — Emergency Department: Payer: Medicaid Other

## 2020-09-20 ENCOUNTER — Other Ambulatory Visit: Payer: Self-pay

## 2020-09-20 DIAGNOSIS — Z20822 Contact with and (suspected) exposure to covid-19: Secondary | ICD-10-CM | POA: Diagnosis not present

## 2020-09-20 DIAGNOSIS — O24111 Pre-existing diabetes mellitus, type 2, in pregnancy, first trimester: Secondary | ICD-10-CM | POA: Diagnosis not present

## 2020-09-20 DIAGNOSIS — Z3A01 Less than 8 weeks gestation of pregnancy: Secondary | ICD-10-CM | POA: Insufficient documentation

## 2020-09-20 DIAGNOSIS — Z79899 Other long term (current) drug therapy: Secondary | ICD-10-CM | POA: Diagnosis not present

## 2020-09-20 DIAGNOSIS — O209 Hemorrhage in early pregnancy, unspecified: Secondary | ICD-10-CM

## 2020-09-20 DIAGNOSIS — O009 Unspecified ectopic pregnancy without intrauterine pregnancy: Secondary | ICD-10-CM | POA: Diagnosis not present

## 2020-09-20 LAB — CHLAMYDIA/NGC RT PCR (ARMC ONLY)
Chlamydia Tr: NOT DETECTED
N gonorrhoeae: NOT DETECTED

## 2020-09-20 LAB — BASIC METABOLIC PANEL
Anion gap: 13 (ref 5–15)
BUN: 13 mg/dL (ref 6–20)
CO2: 21 mmol/L — ABNORMAL LOW (ref 22–32)
Calcium: 9.3 mg/dL (ref 8.9–10.3)
Chloride: 103 mmol/L (ref 98–111)
Creatinine, Ser: 0.6 mg/dL (ref 0.44–1.00)
GFR, Estimated: >60 mL/min (ref >60)
Glucose, Bld: 229 mg/dL — ABNORMAL HIGH (ref 70–99)
Potassium: 3.5 mmol/L (ref 3.5–5.1)
Sodium: 137 mmol/L (ref 135–145)

## 2020-09-20 LAB — CBC
HCT: 36.1 % (ref 36.0–46.0)
Hemoglobin: 12.8 g/dL (ref 12.0–15.0)
MCH: 30.8 pg (ref 26.0–34.0)
MCHC: 35.5 g/dL (ref 30.0–36.0)
MCV: 86.8 fL (ref 80.0–100.0)
Platelets: 334 10*3/uL (ref 150–400)
RBC: 4.16 MIL/uL (ref 3.87–5.11)
RDW: 12.5 % (ref 11.5–15.5)
WBC: 11.3 10*3/uL — ABNORMAL HIGH (ref 4.0–10.5)
nRBC: 0 % (ref 0.0–0.2)

## 2020-09-20 LAB — WET PREP, GENITAL
Sperm: NONE SEEN
Trich, Wet Prep: NONE SEEN
Yeast Wet Prep HPF POC: NONE SEEN

## 2020-09-20 LAB — HCG, QUANTITATIVE, PREGNANCY: hCG, Beta Chain, Quant, S: 4841 m[IU]/mL — ABNORMAL HIGH (ref <5)

## 2020-09-20 LAB — RESP PANEL BY RT-PCR (FLU A&B, COVID) ARPGX2
Influenza A by PCR: NEGATIVE
Influenza B by PCR: NEGATIVE
SARS Coronavirus 2 by RT PCR: NEGATIVE

## 2020-09-20 NOTE — ED Notes (Signed)
EDP collected specimens with interpreter and this nurse at bedside.

## 2020-09-20 NOTE — ED Triage Notes (Signed)
Pt to ED POV for vaginal bleeding since Monday. [redacted] weeks pregnant. States it is similar to spotting. Is on first pad of day .  Denies abdominal pain  States unsure of how long she is supposed to bleed  Skin color WDL, ambulatory to triage with NAD noted

## 2020-09-20 NOTE — ED Notes (Signed)
Lab will run Hcg off of previously collected specimen.

## 2020-09-20 NOTE — Consult Note (Signed)
Consult History and Physical   SERVICE: Gynecology   Patient Name: Claudia Cannon Patient MRN:   [id number]  CC: Vaginal spotting x4 days, actively trying for pregnancy  HPI: Claudia Cannon is a 40 y.o. [id number] at [redacted]w[redacted]d by LMP who is actively working with [facility] in [place]. She was seen by her infertility doctor who did blood work, which she thinks was a beta hcg, yesterday, and said the number was between 2000-3000. Confirmation in care everywhere of 2927 at outside lab.  She is having no abdominal, pelvic or vaginal pain. She is not bleeding heavily, just with a little spotting. This is a very desired pregnancy. LMP 08/02/20, and this was a spontaneous gestation without IUI.  Blood type: O positive  Wet prep positive for clue cells  Ultrasound today with right thick wall cystic lesion that isn't clearly an ectopic but isn't reassuringly simple either. No pelvic fluid, no evidence of intrauterine gestation.  Review of Systems: positives in bold GEN:   fevers, chills, weight changes, appetite changes, fatigue, night sweats HEENT:  HA, vision changes, hearing loss, congestion, rhinorrhea, sinus pressure, dysphagia CV:   CP, palpitations PULM:  SOB, cough GI:  abd pain, N/V/D/C, vaginal spotting GU:  dysuria, urgency, frequency MSK:  arthralgias, myalgias, back pain, swelling SKIN:  rashes, color changes, pallor NEURO:  numbness, weakness, tingling, seizures, dizziness, tremors PSYCH:  depression, anxiety, behavioral problems, confusion  HEME/LYMPH:  easy bruising or bleeding ENDO:  heat/cold intolerance  Past Obstetrical History: OB History    Gravida  5   Para  4   Term  4   Preterm      AB      Living  4     SAB      IAB      Ectopic      Multiple      Live Births  4           Past Gynecologic History: Patient's last menstrual period was 10/04/2019 (within days). Menstrual frequency Q  wks l  Past Medical  History: History reviewed. No pertinent past medical history.  Past Surgical History:  History reviewed. No pertinent surgical history.  Family History:  family history includes Chronic Renal Failure in her mother; Diabetes in her mother; Hyperlipidemia in her mother; Hypertension in her mother.  Social History:  Social History   Socioeconomic History  . Marital status: Married    Spouse name: Not on file  . Number of children: Not on file  . Years of education: Not on file  . Highest education level: Not on file  Occupational History  . Not on file  Tobacco Use  . Smoking status: Never Smoker  . Smokeless tobacco: Never Used  Vaping Use  . Vaping Use: Never used  Substance and Sexual Activity  . Alcohol use: No  . Drug use: No  . Sexual activity: Not on file  Other Topics Concern  . Not on file  Social History Narrative  . Not on file   Social Determinants of Health   Financial Resource Strain: Not on file  Food Insecurity: Not on file  Transportation Needs: Not on file  Physical Activity: Not on file  Stress: Not on file  Social Connections: Not on file  Intimate Partner Violence: Not on file    Home Medications:  Medications reconciled in EPIC  No current facility-administered medications on file prior to encounter.   Current Outpatient Medications on File Prior to Encounter  Medication Sig Dispense Refill  . amoxicillin (AMOXIL) 875 MG tablet Take 1 tablet (875 mg total) by mouth 2 (two) times daily. 20 tablet 0  . rosuvastatin (CRESTOR) 40 MG tablet Take 1 tablet (40 mg total) by mouth daily at 6 PM. 30 tablet 0  . traMADol (ULTRAM) 50 MG tablet Take 1 tablet (50 mg total) by mouth every 6 (six) hours as needed. 15 tablet 0    Allergies:  No Known Allergies  Physical Exam:  Temp:  [98.3 F (36.8 C)] 98.3 F (36.8 C) (04/13 1623) Pulse Rate:  [96-106] 96 (04/13 1830) Resp:  [16-20] 16 (04/13 1830) BP: (123-132)/(70-79) 123/70 (04/13 1830) SpO2:   [96 %-99 %] 98 % (04/13 1830) Weight:  [74.4 kg] 74.4 kg (04/13 1624)   General Appearance:  Well developed, well nourished, no acute distress, alert and oriented x3 HEENT:  Normocephalic atraumatic, extraocular movements intact, moist mucous membranes Cardiovascular:  Normal S1/S2, regular rate and rhythm, no murmurs Pulmonary:  clear to auscultation, no wheezes, rales or rhonchi, symmetric air entry, good air exchange Abdomen:  Bowel sounds present, soft, nontender, nondistended, no abnormal masses, no epigastric pain Extremities:  Full range of motion, no pedal edema, 2+ distal pulses, no tenderness Skin:  normal coloration and turgor, no rashes, no suspicious skin lesions noted  Neurologic:  Cranial nerves 2-12 grossly intact, normal muscle tone,  Psychiatric:  Normal mood and affect, appropriate, no AH/VH Pelvic:  Deferred to prior exam 1 hr ago   Labs/Studies:   CBC and Coags:  Lab Results  Component Value Date   WBC 11.3 (H) 09/20/2020   NEUTOPHILPCT 45 05/05/2019   EOSPCT 2 05/05/2019   BASOPCT 0 05/05/2019   LYMPHOPCT 47 05/05/2019   HGB 12.8 09/20/2020   HCT 36.1 09/20/2020   MCV 86.8 09/20/2020   PLT 334 09/20/2020   CMP:  Lab Results  Component Value Date   NA 137 09/20/2020   K 3.5 09/20/2020   CL 103 09/20/2020   CO2 21 (L) 09/20/2020   BUN 13 09/20/2020   CREATININE 0.60 09/20/2020   CREATININE 0.43 (L) 05/05/2019   CREATININE 0.47 09/18/2018   PROT 7.1 05/05/2019   BILITOT 1.0 05/05/2019   BILIDIR 0.5 09/22/2017   ALT 23 05/05/2019   AST 17 05/05/2019   ALKPHOS 68 05/05/2019   Beta hcg: 4841 Blood type: O pos  Other Imaging: US OB LESS THAN 14 WEEKS WITH OB TRANSVAGINAL  Result Date: 09/20/2020 CLINICAL DATA:  Vaginal bleeding, lower abdominal cramping. EXAM: OBSTETRIC <14 WK [place] AND TRANSVAGINAL OB US TECHNIQUE: Both transabdominal and transvaginal ultrasound examinations were performed for complete evaluation of the gestation as well as the  maternal uterus, adnexal regions, and pelvic cul-de-sac. Transvaginal technique was performed to assess early pregnancy. COMPARISON:  None. FINDINGS: Intrauterine gestational sac: None Yolk sac:  Not Visualized in endometrial space. Embryo:  Not Visualized. Cardiac Activity: Not Visualized. Maternal uterus/adnexae: Trace free fluid is noted which most likely is physiologic. Left ovary is unremarkable. 4.3 cm simple cyst is seen involving the right ovary. There is noted rounded cystic abnormality in the right adnexal region with probable thick wall that measures 6 mm. There is the suggestion of a yolk sac within this cystic abnormality, although it is not definitive. IMPRESSION: No evidence of intrauterine gestation or pregnancy. Rounded thick wall cystic abnormality is noted in the right adnexal region with possible but not definitive yolk sac present, and this is concerning for possible ectopic pregnancy. Critical Value/emergent results  were called by telephone at the time of interpretation on 09/20/2020 at 7:37 pm to provider [facility] , who verbally acknowledged these results. Electronically Signed   By: Lupita Raider M.D.   On: 09/20/2020 19:39     Assessment / Plan:   PRIYANA MCCAREY is a 40 y.o. [id number] who presents with concerns for not-ruptured ectopic pregnancy with neg IUP, cystic lesion in right adnexal and beta hcg >discriminatory zone.   Ectopic pregnancy: Causes discussed with patient, including prevalence, common causes, and outcomes.  However, pt is quite comfortable and very much desires a pregnancy. She asked about multiple gestation, which her REI had discussed as a possibility with fertility management, and I can not conclude definitively with only one beta hcg value and a benign abdomen that this isn't an early multiple gestation. She requests leave to discuss results and management options with her doctor tomorrow and has an appointment with them the day after that is  already scheduled. It is likely that a repeat beta hcg at the lab her original was drawn at may give more guidance and she can make a plan with that doctor about next steps. She would prefer MTX over surgery, and is aware that this may still require the OR.  Pt's overall appearance and physical exam are benign and present no need for acute mgmt at this time.   I counseled the pt about how an ectopic pregnancy or missed AB can result in life-threatening complications. Reviewed with the pt about the importance of serial HCG examinations, the first of which being in 48 hours at this hospital lab (on Friday at approx 4:30pm if not tomorrow at her home doctors office). She confirms that she understands the strict precautions, lives close to this hospital and has people at home who could bring her, and would go to the nearest ER if increasing pain.  She can call my office tomorrow for scheduled followup if unable to get ahold of her doctor- number given.

## 2020-09-20 NOTE — ED Notes (Signed)
OB at bedside

## 2020-09-20 NOTE — Discharge Instructions (Signed)
Please return to the ER immediately if you develop any abdominal pain or worsening bleeding.

## 2020-09-20 NOTE — ED Notes (Signed)
Pt to ultrasound

## 2020-09-20 NOTE — ED Notes (Signed)
Pt to ED c/o first trimester bleeding. Pt [redacted] weeks pregnant. 2 days ago began with pink spotting and light lower abdominal cramping. Bleeding has turned to brownish red, at times bright red. Denies passage of clots or tissue.  Cramping subsided 2 days ago, not having at this time. Denies pain. In NAD.  BP 127/82, pulse 102.

## 2020-09-20 NOTE — ED Notes (Signed)
OB consult coming to speak with patient in approx 45 min. Patient updated on plan of care.

## 2020-09-20 NOTE — ED Provider Notes (Signed)
[facility] Emergency Department Provider Note   ____________________________________________   Event Date/Time   First MD Initiated Contact with Patient 09/20/20 1629     (approximate)  I have reviewed the triage vital signs and the nursing notes.   HISTORY  Chief Complaint Vaginal Bleeding    HPI Claudia Cannon is a 40 y.o. female, G5 P4004 at approximately 7 weeks of pregnancy with past medical history of hyperlipidemia and diabetes who presents to the ED complaining of vaginal bleeding.  Patient reports that she started noticing light vaginal spotting a couple of days ago with bleeding becoming more prominent today.  She states she only notices it when she goes to the bathroom, has only had to change her pad once over 6 hours today.  She has not noticed the passage of any tissue and she denies any abdominal pain.  She has not yet seen an OB/GYN or had an ultrasound this pregnancy.  She denies any dysuria, fever, nausea, or vomiting.        History reviewed. No pertinent past medical history.  Patient Active Problem List   Diagnosis Date Noted  . Other acute pancreatitis without necrosis or infection 09/20/2017  . Hypertriglyceridemia 09/20/2017  . Hyperglycemia without ketosis 09/20/2017  . Acute pancreatitis 09/20/2017    History reviewed. No pertinent surgical history.  Prior to Admission medications   Medication Sig Start Date End Date Taking? Authorizing Provider  amoxicillin (AMOXIL) 875 MG tablet Take 1 tablet (875 mg total) by mouth 2 (two) times daily. 10/10/19   Tommi Rumps, PA-C  rosuvastatin (CRESTOR) 40 MG tablet Take 1 tablet (40 mg total) by mouth daily at 6 PM. 09/24/17   Delfino Lovett, MD  traMADol (ULTRAM) 50 MG tablet Take 1 tablet (50 mg total) by mouth every 6 (six) hours as needed. 10/10/19   Tommi Rumps, PA-C    Allergies Patient has no known allergies.  Family History  Problem Relation Age of Onset   . Diabetes Mother   . Hypertension Mother   . Hyperlipidemia Mother   . Chronic Renal Failure Mother     Social History Social History   Tobacco Use  . Smoking status: Never Smoker  . Smokeless tobacco: Never Used  Vaping Use  . Vaping Use: Never used  Substance Use Topics  . Alcohol use: No  . Drug use: No    Review of Systems  Constitutional: No fever/chills Eyes: No visual changes. ENT: No sore throat. Cardiovascular: Denies chest pain. Respiratory: Denies shortness of breath. Gastrointestinal: No abdominal pain.  No nausea, no vomiting.  No diarrhea.  No constipation. Genitourinary: Negative for dysuria.  Positive for vaginal bleeding. Musculoskeletal: Negative for back pain. Skin: Negative for rash. Neurological: Negative for headaches, focal weakness or numbness.  ____________________________________________   PHYSICAL EXAM:  VITAL SIGNS: ED Triage Vitals  Enc Vitals Group     BP 09/20/20 1623 132/79     Pulse Rate 09/20/20 1623 (!) 106     Resp 09/20/20 1623 20     Temp 09/20/20 1623 98.3 F (36.8 C)     Temp Source 09/20/20 1623 Oral     SpO2 09/20/20 1623 96 %     Weight 09/20/20 1624 164 lb (74.4 kg)     Height 09/20/20 1624 5' (1.524 m)     Head Circumference --      Peak Flow --      Pain Score 09/20/20 1624 0     Pain  Loc --      Pain Edu? --      Excl. in GC? --     Constitutional: Alert and oriented. Eyes: Conjunctivae are normal. Head: Atraumatic. Nose: No congestion/rhinnorhea. Mouth/Throat: Mucous membranes are moist. Neck: Normal ROM Cardiovascular: Normal rate, regular rhythm. Grossly normal heart sounds. Respiratory: Normal respiratory effort.  No retractions. Lungs CTAB. Gastrointestinal: Soft and nontender. No distention. Genitourinary: Scant blood in the vaginal vault with cervical os closed.  No cervical motion or nasal tenderness. Musculoskeletal: No lower extremity tenderness nor edema. Neurologic:  Normal speech and  language. No gross focal neurologic deficits are appreciated. Skin:  Skin is warm, dry and intact. No rash noted. Psychiatric: Mood and affect are normal. Speech and behavior are normal.  ____________________________________________   LABS (all labs ordered are listed, but only abnormal results are displayed)  Labs Reviewed  WET PREP, GENITAL - Abnormal; Notable for the following components:      Result Value   Clue Cells Wet Prep HPF POC PRESENT (*)    WBC, Wet Prep HPF POC MODERATE (*)    All other components within normal limits  CBC - Abnormal; Notable for the following components:   WBC 11.3 (*)    All other components within normal limits  BASIC METABOLIC PANEL - Abnormal; Notable for the following components:   CO2 21 (*)    Glucose, Bld 229 (*)    All other components within normal limits  HCG, QUANTITATIVE, PREGNANCY - Abnormal; Notable for the following components:   hCG, Beta Chain, Quant, S 4,841 (*)    All other components within normal limits  CHLAMYDIA/NGC RT PCR ([facility] ONLY)  RESP PANEL BY RT-PCR (FLU A&B, COVID) [id number]    PROCEDURES  Procedure(s) performed (including Critical Care):  Procedures   ____________________________________________   INITIAL IMPRESSION / ASSESSMENT AND PLAN / ED COURSE       40 year old female, G5 P4-0-0-4 at approximately 7 weeks of pregnancy presents to the ED complaining of 2 days of gradually worsening vaginal bleeding.  She denies any abdominal pain, has no tenderness on exam and cervical exam is reassuring.  We will further assess with ultrasound, H&H is stable compared to previous.  Pelvic ultrasound does not show intrauterine pregnancy but does show cystic structure in right adnexa with likely yolk sac.  Case discussed with Dr. Dalbert Garnet of OB/GYN due to concern for ectopic pregnancy.  Patient was evaluated by Dr. Dalbert Garnet, unfortunately patient has been undergoing fertility treatments and this is a very desired  pregnancy.  Because multiple gestation cannot be ruled out at this time, patient wishes to hold off on definitive treatment for ectopic pregnancy.  Given reassuring vitals with no abdominal tenderness or pain, rupture thought to be very unlikely by Dr. Dalbert Garnet.  Dr. Dalbert Garnet recommends discharge home with follow-up with her OB/GYN at [facility] first thing in the morning and she was given strict return precautions if she were to develop pain or worsening bleeding.      ____________________________________________   FINAL CLINICAL IMPRESSION(S) / ED DIAGNOSES  Final diagnoses:  Ectopic pregnancy, unspecified location, unspecified whether intrauterine pregnancy present     ED Discharge Orders    None       Note:  This document was prepared using Dragon voice recognition software and may include unintentional dictation errors.   Chesley Noon, MD 09/20/20 2300

## 2020-10-26 DIAGNOSIS — Z8759 Personal history of other complications of pregnancy, childbirth and the puerperium: Secondary | ICD-10-CM | POA: Insufficient documentation

## 2021-09-28 ENCOUNTER — Ambulatory Visit
Admission: EM | Admit: 2021-09-28 | Discharge: 2021-09-28 | Disposition: A | Discharge status: Home / Self Care | Payer: Medicaid Other | Attending: Family | Admitting: Family

## 2021-09-28 DIAGNOSIS — J020 Streptococcal pharyngitis: Secondary | ICD-10-CM | POA: Insufficient documentation

## 2021-09-28 LAB — GROUP A STREP BY PCR: Group A Strep by PCR: DETECTED — AB

## 2021-09-28 MED ORDER — PENICILLIN V POTASSIUM 500 MG PO TABS: 500.0000 mg | ORAL_TABLET | Freq: Two times a day (BID) | ORAL | 0 refills | Status: AC

## 2021-09-28 NOTE — Discharge Instructions (Addendum)
Start Penicillin (antibiotic) twice a day as directed. Continue Tylenol 1000mg  every 8 hours as needed. Follow-up in 3 to 4 days if not improving.  ?

## 2021-09-28 NOTE — ED Provider Notes (Signed)
?[facility] ? ? ? ?CSN: [id number] ?Arrival date & time: 09/28/21  0801 ? ? ?  ? ?History   ?Chief Complaint ?Chief Complaint  ?Patient presents with  ? Fever  ? Headache  ? ? ?HPI ?Claudia Cannon is a 41 y.o. female.  ? ?41 year old female presents with fever for the past 2 days along with frontal and posterior headache. Fever was 104 last night. This morning developed a sore throat. Denies any nasal congestion, runny nose, cough, vomiting or diarrhea. Some nausea. Has taken Tylenol with some relief. No other family members ill. No known exposure to strep. Other chronic health issues include hyperglycemia and hyperlipidemia. Currently on Metformin and Lexapro daily.  ? ?The history is provided by the patient. No language interpreter was used (patient can understand and speak English well. No Spanish interpreter needed at this time.).  ? ?History reviewed. No pertinent past medical history. ? ?Patient Active Problem List  ? Diagnosis Date Noted  ? Hx of ectopic pregnancy 10/26/2020  ? 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 Other acute pancreatitis without necrosis or infection 09/20/2017  ? Hypertriglyceridemia 09/20/2017  ? Hyperglycemia without ketosis 09/20/2017  ? Acute pancreatitis 09/20/2017  ? ? ?History reviewed. No pertinent surgical history. ? ?OB History   ? ? Gravida  ?5  ? Para  ?4  ? Term  ?4  ? Preterm  ?   ? AB  ?   ? Living  ?4  ?  ? ? SAB  ?   ? IAB  ?   ? Ectopic  ?   ? Multiple  ?   ? Live Births  ?4  ?   ?  ?  ? ? ? ?Home Medications   ? ?Prior to Admission medications   ?Medication Sig Start Date End Date Taking? Authorizing Provider  ?acetaminophen (TYLENOL) 325 MG tablet Take 650 mg by mouth every 6 (six) hours as needed. Last dose: 6 am.   Yes [provider]  ?penicillin v potassium (VEETID) 500 MG tablet Take 1 tablet (500 mg total) by mouth in the morning and at bedtime for 10 days. 09/28/21 10/08/21 Yes Donnita Farina, Ali LoweAnn Berry, NP  ?escitalopram  (LEXAPRO) 10 MG tablet Take 10 mg by mouth daily. 08/16/21   [provider]  ?hydrocortisone 2.5 % cream Apply topically. 08/20/21   [provider]  ?lidocaine (LIDODERM) 5 % 1 patch daily. 08/20/21   [provider]  ?loratadine (CLARITIN) 10 MG tablet Take by mouth.    [provider]  ?metFORMIN (GLUCOPHAGE-XR) 500 MG 24 hr tablet Take 500 mg by mouth 2 (two) times daily. 08/16/21   [provider]  ? ? ?Family History ?Family History  ?Problem Relation Age of Onset  ? Diabetes Mother   ? Hypertension Mother   ? Hyperlipidemia Mother   ? Chronic Renal Failure Mother   ? ? ?Social History ?Social History  ? ?Tobacco Use  ? Smoking status: Never  ? Smokeless tobacco: Never  ?Vaping Use  ? Vaping Use: Never used  ?Substance Use Topics  ? Alcohol use: No  ? Drug use: No  ? ? ? ?Allergies   ?Patient has no known allergies. ? ? ?Review of Systems ?Review of Systems  ?Constitutional:  Positive for appetite change, chills, fatigue and fever. Negative for diaphoresis.  ?HENT:  Positive for sore throat. Negative for congestion, ear discharge, ear pain, mouth sores, nosebleeds, postnasal drip, rhinorrhea, sinus pressure, sinus pain  and trouble swallowing.   ?Eyes:  Negative for pain, discharge, redness and itching.  ?Respiratory:  Negative for cough, chest tightness and wheezing.   ?Gastrointestinal:  Positive for nausea. Negative for diarrhea and vomiting.  ?Musculoskeletal:  Positive for arthralgias and myalgias. Negative for neck pain and neck stiffness.  ?Skin:  Negative for color change and rash.  ?Allergic/Immunologic: Negative for environmental allergies, food allergies and immunocompromised state.  ?Neurological:  Positive for headaches. Negative for dizziness, seizures, syncope and light-headedness.  ?Hematological:  Negative for adenopathy. Does not bruise/bleed easily.  ? ? ?Physical Exam ?Triage Vital Signs ?ED Triage Vitals  ?Enc Vitals Group  ?   BP 09/28/21 0809 118/77   ?   Pulse Rate 09/28/21 0809 (!) 108  ?   Resp 09/28/21 0809 18  ?   Temp 09/28/21 0809 98.8 ?F (37.1 ?C)  ?   Temp Source 09/28/21 0809 Oral  ?   SpO2 09/28/21 0809 99 %  ?   Weight 09/28/21 0807 158 lb (71.7 kg)  ?   Height 09/28/21 0807 5' (1.524 m)  ?   Head Circumference --   ?   Peak Flow --   ?   Pain Score 09/28/21 0806 4  ?   Pain Loc --   ?   Pain Edu? --   ?   Excl. in GC? --   ? ?No data found. ? ?Updated Vital Signs ?BP 118/77 (BP Location: Right Arm)   Pulse (!) 108   Temp 98.8 ?F (37.1 ?C) (Oral)   Resp 18   Ht 5' (1.524 m)   Wt 158 lb (71.7 kg)   LMP 09/28/2021 (Exact Date)   SpO2 99%   BMI 30.86 kg/m?  ? ?Visual Acuity ?Right Eye Distance:   ?Left Eye Distance:   ?Bilateral Distance:   ? ?Right Eye Near:   ?Left Eye Near:    ?Bilateral Near:    ? ?Physical Exam ?Vitals and nursing note reviewed.  ?Constitutional:   ?   General: She is awake. She is not in acute distress. ?   Appearance: She is well-developed and well-groomed. She is ill-appearing.  ?   Comments: She is sitting on the exam table in no acute distress but appears tired and ill.   ?HENT:  ?   Head: Normocephalic and atraumatic.  ?   Right Ear: Hearing, tympanic membrane, ear canal and external ear normal.  ?   Left Ear: Hearing, tympanic membrane, ear canal and external ear normal.  ?   Nose: Nose normal. No congestion or rhinorrhea.  ?   Right Sinus: No maxillary sinus tenderness or frontal sinus tenderness.  ?   Left Sinus: No maxillary sinus tenderness or frontal sinus tenderness.  ?   Mouth/Throat:  ?   Lips: Pink.  ?   Mouth: Mucous membranes are moist.  ?   Pharynx: Uvula midline. Posterior oropharyngeal erythema present. No pharyngeal swelling, oropharyngeal exudate or uvula swelling.  ?Eyes:  ?   Extraocular Movements: Extraocular movements intact.  ?   Conjunctiva/sclera: Conjunctivae normal.  ?Cardiovascular:  ?   Rate and Rhythm: Regular rhythm. Tachycardia present.  ?   Heart sounds: Normal heart sounds. No murmur  heard. ?Pulmonary:  ?   Effort: Pulmonary effort is normal. No respiratory distress.  ?   Breath sounds: Normal breath sounds and air entry. No decreased air movement. No decreased breath sounds, wheezing, rhonchi or rales.  ?Musculoskeletal:  ?   Cervical back: Normal range of  motion and neck supple.  ?Lymphadenopathy:  ?   Head:  ?   Right side of head: Tonsillar adenopathy present.  ?   Left side of head: Tonsillar adenopathy present.  ?   Cervical: Cervical adenopathy present.  ?   Right cervical: Superficial cervical adenopathy present.  ?   Left cervical: Superficial cervical adenopathy present.  ?Skin: ?   General: Skin is warm and dry.  ?   Capillary Refill: Capillary refill takes less than 2 seconds.  ?   Findings: No rash.  ?Neurological:  ?   General: No focal deficit present.  ?   Mental Status: She is alert and oriented to person, place, and time.  ?Psychiatric:     ?   Mood and Affect: Mood normal.     ?   Behavior: Behavior normal. Behavior is cooperative.     ?   Thought Content: Thought content normal.     ?   Judgment: Judgment normal.  ? ? ? ?UC Treatments / Results  ?Labs ?(all labs ordered are listed, but only abnormal results are displayed) ?Labs Reviewed  ?GROUP A STREP BY PCR - Abnormal; Notable for the following components:  ?    Result Value  ? Group A Strep by PCR DETECTED (*)   ? All other components within normal limits  ? ? ?EKG ? ? ?Radiology ?No results found. ? ?Procedures ?Procedures (including critical care time) ? ?Medications Ordered in UC ?Medications - No data to display ? ?Initial Impression / Assessment and Plan / UC Course  ?I have reviewed the triage vital signs and the nursing notes. ? ?Pertinent labs & imaging results that were available during my care of the patient were reviewed by me and considered in my medical decision making (see chart for details). ? ?  ? ?Reviewed positive strep test result with patient. Recommend start PCN 500mg  twice a day for 10 days. May  continue OTC Tylenol 1000mg  every 8 hours as needed for pain or headache. Continue to push fluids to stay well hydrated. Follow-up in 3 to 4 days if not improving.    ?Final Clinical Impressions(s) / UC Diagnoses

## 2021-09-28 NOTE — ED Triage Notes (Signed)
Patient is here for "Fever with [name]". Started 2 days ago. Body aches as well. Highest Fever "104 this morning". Some sore throat. No new/unexplained rash.  ?

## 2021-10-31 ENCOUNTER — Emergency Department
Admission: EM | Admit: 2021-10-31 | Discharge: 2021-10-31 | Disposition: A | Discharge status: Home / Self Care | Payer: 59 | Attending: Emergency Medicine | Admitting: Emergency Medicine

## 2021-10-31 ENCOUNTER — Other Ambulatory Visit: Payer: Self-pay

## 2021-10-31 ENCOUNTER — Emergency Department: Payer: 59

## 2021-10-31 DIAGNOSIS — O26899 Other specified pregnancy related conditions, unspecified trimester: Secondary | ICD-10-CM | POA: Insufficient documentation

## 2021-10-31 DIAGNOSIS — Z3490 Encounter for supervision of normal pregnancy, unspecified, unspecified trimester: Secondary | ICD-10-CM

## 2021-10-31 DIAGNOSIS — Z3A00 Weeks of gestation of pregnancy not specified: Secondary | ICD-10-CM | POA: Insufficient documentation

## 2021-10-31 DIAGNOSIS — R102 Pelvic and perineal pain: Secondary | ICD-10-CM | POA: Diagnosis not present

## 2021-10-31 DIAGNOSIS — O009 Unspecified ectopic pregnancy without intrauterine pregnancy: Secondary | ICD-10-CM | POA: Diagnosis not present

## 2021-10-31 DIAGNOSIS — O3680X0 Pregnancy with inconclusive fetal viability, not applicable or unspecified: Secondary | ICD-10-CM

## 2021-10-31 HISTORY — DX: Type 2 diabetes mellitus without complications: E11.9

## 2021-10-31 LAB — COMPREHENSIVE METABOLIC PANEL
ALT: 36 U/L (ref 0–44)
AST: 28 U/L (ref 15–41)
Albumin: 3.8 g/dL (ref 3.5–5.0)
Alkaline Phosphatase: 57 U/L (ref 38–126)
Anion gap: 8 (ref 5–15)
BUN: 14 mg/dL (ref 6–20)
CO2: 23 mmol/L (ref 22–32)
Calcium: 9.2 mg/dL (ref 8.9–10.3)
Chloride: 105 mmol/L (ref 98–111)
Creatinine, Ser: 0.57 mg/dL (ref 0.44–1.00)
GFR, Estimated: >60 mL/min (ref >60)
Glucose, Bld: 189 mg/dL — ABNORMAL HIGH (ref 70–99)
Potassium: 3.3 mmol/L — ABNORMAL LOW (ref 3.5–5.1)
Sodium: 136 mmol/L (ref 135–145)
Total Bilirubin: 1.1 mg/dL (ref 0.3–1.2)
Total Protein: 7.1 g/dL (ref 6.5–8.1)

## 2021-10-31 LAB — CBC WITH DIFFERENTIAL/PLATELET
Abs Immature Granulocytes: 0.04 10*3/uL (ref 0.00–0.07)
Basophils Absolute: 0 10*3/uL (ref 0.0–0.1)
Basophils Relative: 0 %
Eosinophils Absolute: 0.1 10*3/uL (ref 0.0–0.5)
Eosinophils Relative: 1 %
HCT: 38.1 % (ref 36.0–46.0)
Hemoglobin: 12.8 g/dL (ref 12.0–15.0)
Immature Granulocytes: 0 %
Lymphocytes Relative: 37 %
Lymphs Abs: 3.5 10*3/uL (ref 0.7–4.0)
MCH: 29.6 pg (ref 26.0–34.0)
MCHC: 33.6 g/dL (ref 30.0–36.0)
MCV: 88 fL (ref 80.0–100.0)
Monocytes Absolute: 0.5 10*3/uL (ref 0.1–1.0)
Monocytes Relative: 6 %
Neutro Abs: 5.3 10*3/uL (ref 1.7–7.7)
Neutrophils Relative %: 56 %
Platelets: 330 10*3/uL (ref 150–400)
RBC: 4.33 MIL/uL (ref 3.87–5.11)
RDW: 12.9 % (ref 11.5–15.5)
WBC: 9.5 10*3/uL (ref 4.0–10.5)
nRBC: 0 % (ref 0.0–0.2)

## 2021-10-31 LAB — URINALYSIS, ROUTINE W REFLEX MICROSCOPIC
Bilirubin Urine: NEGATIVE
Glucose, UA: NEGATIVE mg/dL
Hgb urine dipstick: NEGATIVE
Ketones, ur: 5 mg/dL — AB
Leukocytes,Ua: NEGATIVE
Nitrite: NEGATIVE
Protein, ur: NEGATIVE mg/dL
Specific Gravity, Urine: 1.023 (ref 1.005–1.030)
pH: 5 (ref 5.0–8.0)

## 2021-10-31 LAB — HCG, QUANTITATIVE, PREGNANCY: hCG, Beta Chain, Quant, S: 723 m[IU]/mL — ABNORMAL HIGH (ref <5)

## 2021-10-31 LAB — POC URINE PREG, ED: Preg Test, Ur: POSITIVE — AB

## 2021-10-31 MED ORDER — POTASSIUM CHLORIDE CRYS ER 20 MEQ PO TBCR
40.0000 meq | EXTENDED_RELEASE_TABLET | Freq: Once | ORAL | Status: AC
  Administered 2021-10-31: 40 meq via ORAL
  Filled 2021-10-31: qty 2

## 2021-10-31 NOTE — Discharge Instructions (Addendum)
You are very high risk for an ectopic pregnancy so if you develop severe pain on one side of your abdomen, vaginal bleeding then return to the ER emergently for repeat evaluation.  Or if you are having difficulty getting outpatient follow-up and return to the ER.  She was in the  HCG 700s today and you need a repeat on Friday.  I discussed with the [facility] clinic OB/GYN who will schedule you an appointment and follow-up with you for repeat exam.  If you do not hear from them tomorrow please call them to confirm your appointment time.  However if there are any concerns with this happening or getting there then please return here as we would be happy to see you again if there are any issues      1. No candidate intrauterine gestation identified by ultrasound.  Early pregnancy of unknown location. Recommend ectopic precautions,  serial beta HCG, and follow-up ultrasound in 7-14 days to evaluate  continued development and viability.  2. Trace nonspecific simple appearing free fluid in the low pelvis.

## 2021-10-31 NOTE — ED Provider Notes (Addendum)
[facility] Provider Note    Event Date/Time   First MD Initiated Contact with Patient 10/31/21 1141     (approximate)   History   Pelvic Pain   HPI  Claudia Cannon is a 41 y.o. female  [phone] with prior ectopic who comes in with concerns for pregnancy.  Patient reports having a prior tubal ligation and then underwent a repeat anastomosis on 120 12/2018 but then had a ectopic pregnancy in April 2022 which led to a right salpingectomy and she is following with [facility] to consider IVF.  She had followed up with our clinic to discuss IVF when they noted that her hCG level was elevated.  She reports coming to the emergency room today to make sure she did not have a ectopic pregnancy given her prior history of this.  She reports her pain is more pelvic in nature in the midline and not significantly tender.  She denies any vaginal bleeding or vaginal discharge.  She reports recently having STD testing that was all reassuring she reports 1 partner with her husband and declines repeat STD testing today  Patient declines interpreter given her daughter his there and she would prefer her to interpret  Physical Exam   Triage Vital Signs: ED Triage Vitals  Enc Vitals Group     BP 10/31/21 1008 129/79     Pulse Rate 10/31/21 1008 97     Resp 10/31/21 1008 18     Temp 10/31/21 1008 98.6 F (37 C)     Temp Source 10/31/21 1008 Oral     SpO2 10/31/21 1008 96 %     Weight --      Height --      Head Circumference --      Peak Flow --      Pain Score 10/31/21 1014 4     Pain Loc --      Pain Edu? --      Excl. in [place]? --     Most recent vital signs: Vitals:   10/31/21 1008  BP: 129/79  Pulse: 97  Resp: 18  Temp: 98.6 F (37 C)  SpO2: 96%     General: Awake, no distress.  CV:  Good peripheral perfusion.  Resp:  Normal effort.  Abd:  No distention.  Soft nontender Other:     ED Results / Procedures / Treatments   Labs (all labs  ordered are listed, but only abnormal results are displayed) Labs Reviewed  HCG, QUANTITATIVE, PREGNANCY - Abnormal; Notable for the following components:      Result Value   hCG, Beta Chain, Quant, S 723 (*)    All other components within normal limits  URINALYSIS, ROUTINE W REFLEX MICROSCOPIC - Abnormal; Notable for the following components:   Color, Urine YELLOW (*)    APPearance HAZY (*)    Ketones, ur 5 (*)    All other components within normal limits  COMPREHENSIVE METABOLIC PANEL - Abnormal; Notable for the following components:   Potassium 3.3 (*)    Glucose, Bld 189 (*)    All other components within normal limits  POC URINE PREG, ED - Abnormal; Notable for the following components:   Preg Test, Ur POSITIVE (*)    All other components within normal limits  CBC WITH DIFFERENTIAL/PLATELET      RADIOLOGY I have reviewed the ultrasound personally no IUP yet. Pending read    PROCEDURES:  Critical Care performed: No  Procedures  MEDICATIONS ORDERED IN ED: Medications - No data to display   IMPRESSION / MDM / Gideon / ED COURSE  I reviewed the triage vital signs and the nursing notes.  Patient comes in with some abdominal pain in the setting of pregnancy with history of ectopic.  This concerning for acute life-threatening pathology such as ectopic versus miscarriage versus UTI.  Declines repeat STD testing given recent testing was negative and denies any new contacts.  No vaginal bleeding.  She declines anything for pain.    Patient is hCG level on 10/30/2021 was 381 She had a type and screen done on 06/2018 that was O+   CMP shows slightly low potassium we will give some repletion.  hCG is uptrending to 723.  CBC is reassuring.  UA without evidence of UTI.  IMPRESSION: 1. No candidate intrauterine gestation identified by ultrasound. Early pregnancy of unknown location. Recommend ectopic precautions, serial beta HCG, and follow-up ultrasound in 7-14  days to evaluate continued development and viability. 2. Trace nonspecific simple appearing free fluid in the low pelvis.      Discussed with patient and she already has a follow-up hCG tomorrow but we discussed that she would need a 48-hour hCG level.  On exam she has no significant tenderness and her pain that she is complained about earlier was more suprapubic in nature.  There was some trace free fluid but it is simple appearing and her exam does not seem consistent with a ruptured ectopic.  However patient does not want to follow-up at the [facility] fertility clinic and is requesting follow-up at [facility] clinic OB/GYN therefore I will discuss the case with them.  She did not have to use IVF or any injections to get pregnant for this pregnancy.  Discussed the case with Lucrezia Europe and she agrees that some of the pelvic fluid can be normal especially given it is simple in appearance and can follow-up for repeat exam and beta hCG on Friday and they will work on getting her an appointment for then. We discussed utility of MRI to try and catch ectopic early to preserve her ovary but low utility given how low hcg is.   I had a lengthy conversation with patient and her daughter about the high risk for ectopic and her needing very close follow-up and if there is any issues getting follow-up then to return to the ER but at this time she has no significant pain no vaginal bleeding and I have lower suspicion for ruptured ectopic and patient feels comfortable with discharge home with very close OB/GYN follow-up   FINAL CLINICAL IMPRESSION(S) / ED DIAGNOSES   Final diagnoses:  Pregnancy, unspecified gestational age  Pregnancy of unknown anatomic location     Rx / DC Orders   ED Discharge Orders     None        Note:  This document was prepared using Dragon voice recognition software and may include unintentional dictation errors.   Vanessa Otway, MD 10/31/21 1452    Vanessa Louann,  MD 10/31/21 1523    Vanessa [place], MD 10/31/21 [phone]

## 2021-10-31 NOTE — ED Triage Notes (Signed)
Pt states she just found out she was pregnant and is having lower abd pain since yesterday and is concerned of ectopic pregnancy after having one last yesterday, denies any vaginal bleeding. Pt is in NAD at present

## 2021-11-13 DIAGNOSIS — O09521 Supervision of elderly multigravida, first trimester: Secondary | ICD-10-CM

## 2021-11-13 DIAGNOSIS — Z3490 Encounter for supervision of normal pregnancy, unspecified, unspecified trimester: Secondary | ICD-10-CM

## 2021-11-13 DIAGNOSIS — O09523 Supervision of elderly multigravida, third trimester: Secondary | ICD-10-CM | POA: Insufficient documentation

## 2021-11-20 ENCOUNTER — Ambulatory Visit: Payer: 59 | Admitting: *Deleted

## 2021-11-27 ENCOUNTER — Ambulatory Visit: Payer: 59 | Admitting: *Deleted

## 2022-01-01 ENCOUNTER — Ambulatory Visit: Admission: EM | Admit: 2022-01-01 | Discharge: 2022-01-01 | Payer: 59

## 2022-01-01 NOTE — ED Notes (Signed)
Pt presents to clinic with pelvic pain, nausea and HA she is [redacted] weeks pregnant. Using the mobile interpreter pt advised to go to the ED for further evaluation. She declined EMS and went via personal vehicle with a family member to [facility].

## 2022-01-11 ENCOUNTER — Other Ambulatory Visit: Payer: Self-pay

## 2022-01-11 ENCOUNTER — Observation Stay
Admission: EM | Admit: 2022-01-11 | Discharge: 2022-01-12 | Disposition: A | Discharge status: Home / Self Care | Payer: Medicaid Other | Attending: Internal Medicine | Admitting: Internal Medicine

## 2022-01-11 ENCOUNTER — Emergency Department: Payer: Medicaid Other

## 2022-01-11 DIAGNOSIS — E118 Type 2 diabetes mellitus with unspecified complications: Secondary | ICD-10-CM

## 2022-01-11 DIAGNOSIS — R109 Unspecified abdominal pain: Secondary | ICD-10-CM | POA: Diagnosis present

## 2022-01-11 DIAGNOSIS — Z7984 Long term (current) use of oral hypoglycemic drugs: Secondary | ICD-10-CM | POA: Insufficient documentation

## 2022-01-11 DIAGNOSIS — Z3490 Encounter for supervision of normal pregnancy, unspecified, unspecified trimester: Secondary | ICD-10-CM

## 2022-01-11 DIAGNOSIS — O98812 Other maternal infectious and parasitic diseases complicating pregnancy, second trimester: Secondary | ICD-10-CM | POA: Insufficient documentation

## 2022-01-11 DIAGNOSIS — Z3A15 15 weeks gestation of pregnancy: Secondary | ICD-10-CM | POA: Insufficient documentation

## 2022-01-11 DIAGNOSIS — R6510 Systemic inflammatory response syndrome (SIRS) of non-infectious origin without acute organ dysfunction: Secondary | ICD-10-CM

## 2022-01-11 DIAGNOSIS — Z794 Long term (current) use of insulin: Secondary | ICD-10-CM | POA: Diagnosis not present

## 2022-01-11 DIAGNOSIS — Z79899 Other long term (current) drug therapy: Secondary | ICD-10-CM | POA: Diagnosis not present

## 2022-01-11 DIAGNOSIS — O24112 Pre-existing diabetes mellitus, type 2, in pregnancy, second trimester: Secondary | ICD-10-CM | POA: Insufficient documentation

## 2022-01-11 DIAGNOSIS — A419 Sepsis, unspecified organism: Secondary | ICD-10-CM | POA: Insufficient documentation

## 2022-01-11 DIAGNOSIS — E119 Type 2 diabetes mellitus without complications: Secondary | ICD-10-CM | POA: Insufficient documentation

## 2022-01-11 DIAGNOSIS — R1031 Right lower quadrant pain: Secondary | ICD-10-CM | POA: Diagnosis not present

## 2022-01-11 DIAGNOSIS — Z20822 Contact with and (suspected) exposure to covid-19: Secondary | ICD-10-CM | POA: Diagnosis not present

## 2022-01-11 DIAGNOSIS — O26892 Other specified pregnancy related conditions, second trimester: Principal | ICD-10-CM | POA: Insufficient documentation

## 2022-01-11 DIAGNOSIS — O09521 Supervision of elderly multigravida, first trimester: Secondary | ICD-10-CM

## 2022-01-11 DIAGNOSIS — O09522 Supervision of elderly multigravida, second trimester: Secondary | ICD-10-CM | POA: Insufficient documentation

## 2022-01-11 LAB — COMPREHENSIVE METABOLIC PANEL
ALT: 12 U/L (ref 0–44)
AST: 23 U/L (ref 15–41)
Albumin: 3.8 g/dL (ref 3.5–5.0)
Alkaline Phosphatase: 50 U/L (ref 38–126)
Anion gap: 11 (ref 5–15)
BUN: 10 mg/dL (ref 6–20)
CO2: 21 mmol/L — ABNORMAL LOW (ref 22–32)
Calcium: 8.6 mg/dL — ABNORMAL LOW (ref 8.9–10.3)
Chloride: 103 mmol/L (ref 98–111)
Creatinine, Ser: 0.56 mg/dL (ref 0.44–1.00)
GFR, Estimated: >60 mL/min (ref >60)
Glucose, Bld: 136 mg/dL — ABNORMAL HIGH (ref 70–99)
Potassium: 3.6 mmol/L (ref 3.5–5.1)
Sodium: 135 mmol/L (ref 135–145)
Total Bilirubin: 1.1 mg/dL (ref 0.3–1.2)
Total Protein: 7.3 g/dL (ref 6.5–8.1)

## 2022-01-11 LAB — CBC WITH DIFFERENTIAL/PLATELET
Abs Immature Granulocytes: 0.1 10*3/uL — ABNORMAL HIGH (ref 0.00–0.07)
Basophils Absolute: 0 10*3/uL (ref 0.0–0.1)
Basophils Relative: 0 %
Eosinophils Absolute: 0 10*3/uL (ref 0.0–0.5)
Eosinophils Relative: 0 %
HCT: 36.5 % (ref 36.0–46.0)
Hemoglobin: 12.3 g/dL (ref 12.0–15.0)
Immature Granulocytes: 1 %
Lymphocytes Relative: 5 %
Lymphs Abs: 0.9 10*3/uL (ref 0.7–4.0)
MCH: 29.6 pg (ref 26.0–34.0)
MCHC: 33.7 g/dL (ref 30.0–36.0)
MCV: 88 fL (ref 80.0–100.0)
Monocytes Absolute: 0.8 10*3/uL (ref 0.1–1.0)
Monocytes Relative: 4 %
Neutro Abs: 18.7 10*3/uL — ABNORMAL HIGH (ref 1.7–7.7)
Neutrophils Relative %: 90 %
Platelets: 327 10*3/uL (ref 150–400)
RBC: 4.15 MIL/uL (ref 3.87–5.11)
RDW: 12.1 % (ref 11.5–15.5)
WBC: 20.6 10*3/uL — ABNORMAL HIGH (ref 4.0–10.5)
nRBC: 0 % (ref 0.0–0.2)

## 2022-01-11 LAB — MONONUCLEOSIS SCREEN: Mono Screen: NEGATIVE

## 2022-01-11 LAB — LACTIC ACID, PLASMA
Lactic Acid, Venous: 3 mmol/L (ref 0.5–1.9)
Lactic Acid, Venous: 2.5 mmol/L (ref 0.5–1.9)
Lactic Acid, Venous: 1.6 mmol/L (ref 0.5–1.9)

## 2022-01-11 LAB — POC URINE PREG, ED: Preg Test, Ur: POSITIVE — AB

## 2022-01-11 LAB — URINALYSIS, ROUTINE W REFLEX MICROSCOPIC
Bilirubin Urine: NEGATIVE
Glucose, UA: NEGATIVE mg/dL
Hgb urine dipstick: NEGATIVE
Ketones, ur: 20 mg/dL — AB
Leukocytes,Ua: NEGATIVE
Nitrite: NEGATIVE
Protein, ur: NEGATIVE mg/dL
Specific Gravity, Urine: 1.02 (ref 1.005–1.030)
pH: 5 (ref 5.0–8.0)

## 2022-01-11 LAB — RESP PANEL BY RT-PCR (FLU A&B, COVID) ARPGX2
Influenza A by PCR: NEGATIVE
Influenza B by PCR: NEGATIVE
SARS Coronavirus 2 by RT PCR: NEGATIVE

## 2022-01-11 LAB — WET PREP, GENITAL
Clue Cells Wet Prep HPF POC: NONE SEEN
Sperm: NONE SEEN
Trich, Wet Prep: NONE SEEN
WBC, Wet Prep HPF POC: <10 (ref <10)
Yeast Wet Prep HPF POC: NONE SEEN

## 2022-01-11 LAB — CHLAMYDIA/NGC RT PCR (ARMC ONLY)
Chlamydia Tr: NOT DETECTED
N gonorrhoeae: NOT DETECTED

## 2022-01-11 LAB — PROCALCITONIN: Procalcitonin: <0.1 ng/mL

## 2022-01-11 LAB — GROUP A STREP BY PCR: Group A Strep by PCR: NOT DETECTED

## 2022-01-11 LAB — GLUCOSE, CAPILLARY: Glucose-Capillary: 137 mg/dL — ABNORMAL HIGH (ref 70–99)

## 2022-01-11 MED ORDER — ENOXAPARIN SODIUM 40 MG/0.4ML IJ SOSY
40.0000 mg | PREFILLED_SYRINGE | INTRAMUSCULAR | Status: DC
  Administered 2022-01-12: 40 mg via SUBCUTANEOUS
  Filled 2022-01-11: qty 0.4

## 2022-01-11 MED ORDER — FENTANYL CITRATE PF 50 MCG/ML IJ SOSY
25.0000 ug | PREFILLED_SYRINGE | Freq: Once | INTRAMUSCULAR | Status: AC
  Administered 2022-01-11: 25 ug via INTRAVENOUS
  Filled 2022-01-11: qty 1

## 2022-01-11 MED ORDER — PRENATAL MULTIVITAMIN CH
1.0000 | ORAL_TABLET | Freq: Every day | ORAL | Status: DC
  Administered 2022-01-12: 1 via ORAL
  Filled 2022-01-11: qty 1

## 2022-01-11 MED ORDER — BISACODYL 5 MG PO TBEC: 5.0000 mg | DELAYED_RELEASE_TABLET | Freq: Every day | ORAL | Status: DC | PRN

## 2022-01-11 MED ORDER — ACETAMINOPHEN 650 MG RE SUPP: 650.0000 mg | Freq: Four times a day (QID) | RECTAL | Status: DC | PRN

## 2022-01-11 MED ORDER — ACETAMINOPHEN 500 MG PO TABS
1000.0000 mg | ORAL_TABLET | Freq: Once | ORAL | Status: AC
  Administered 2022-01-11: 1000 mg via ORAL
  Filled 2022-01-11: qty 2

## 2022-01-11 MED ORDER — DOCUSATE SODIUM 100 MG PO CAPS
100.0000 mg | ORAL_CAPSULE | Freq: Two times a day (BID) | ORAL | Status: DC
  Administered 2022-01-12 (×2): 100 mg via ORAL
  Filled 2022-01-11 (×2): qty 1

## 2022-01-11 MED ORDER — INSULIN NPH (HUMAN) (ISOPHANE) 100 UNIT/ML ~~LOC~~ SUSP
12.0000 [IU] | Freq: Every day | SUBCUTANEOUS | Status: DC
  Filled 2022-01-11 (×3): qty 10

## 2022-01-11 MED ORDER — INSULIN ASPART 100 UNIT/ML IJ SOLN
0.0000 [IU] | Freq: Three times a day (TID) | INTRAMUSCULAR | Status: DC
  Administered 2022-01-12: 2 [IU] via SUBCUTANEOUS
  Filled 2022-01-11: qty 1

## 2022-01-11 MED ORDER — ALBUTEROL SULFATE (2.5 MG/3ML) 0.083% IN NEBU: 2.5000 mg | INHALATION_SOLUTION | RESPIRATORY_TRACT | Status: DC | PRN

## 2022-01-11 MED ORDER — SODIUM CHLORIDE 0.9 % IV BOLUS
1000.0000 mL | Freq: Once | INTRAVENOUS | Status: AC
  Administered 2022-01-11: 1000 mL via INTRAVENOUS

## 2022-01-11 MED ORDER — LACTATED RINGERS IV BOLUS
1000.0000 mL | Freq: Once | INTRAVENOUS | Status: AC
  Administered 2022-01-11: 1000 mL via INTRAVENOUS

## 2022-01-11 MED ORDER — FENTANYL CITRATE PF 50 MCG/ML IJ SOSY
50.0000 ug | PREFILLED_SYRINGE | Freq: Once | INTRAMUSCULAR | Status: AC
  Administered 2022-01-11: 50 ug via INTRAVENOUS
  Filled 2022-01-11: qty 1

## 2022-01-11 MED ORDER — PROMETHAZINE HCL 25 MG PO TABS: 12.5000 mg | ORAL_TABLET | Freq: Four times a day (QID) | ORAL | Status: DC | PRN

## 2022-01-11 MED ORDER — POLYETHYLENE GLYCOL 3350 17 G PO PACK: 17.0000 g | PACK | Freq: Every day | ORAL | Status: DC | PRN

## 2022-01-11 MED ORDER — SODIUM CHLORIDE 0.9 % IV SOLN: Freq: Once | INTRAVENOUS | Status: AC

## 2022-01-11 MED ORDER — SODIUM CHLORIDE 0.9% FLUSH
3.0000 mL | Freq: Two times a day (BID) | INTRAVENOUS | Status: DC
  Administered 2022-01-12 (×2): 3 mL via INTRAVENOUS

## 2022-01-11 MED ORDER — LACTATED RINGERS IV SOLN: INTRAVENOUS | Status: DC

## 2022-01-11 MED ORDER — INSULIN ASPART 100 UNIT/ML IJ SOLN: 0.0000 [IU] | Freq: Every day | INTRAMUSCULAR | Status: DC

## 2022-01-11 MED ORDER — INSULIN NPH (HUMAN) (ISOPHANE) 100 UNIT/ML ~~LOC~~ SUSP: 12.0000 [IU] | Freq: Every day | SUBCUTANEOUS | Status: DC

## 2022-01-11 MED ORDER — ESCITALOPRAM OXALATE 10 MG PO TABS
10.0000 mg | ORAL_TABLET | Freq: Every day | ORAL | Status: DC
  Administered 2022-01-12: 10 mg via ORAL
  Filled 2022-01-11: qty 1

## 2022-01-11 MED ORDER — ACETAMINOPHEN 325 MG PO TABS
650.0000 mg | ORAL_TABLET | Freq: Four times a day (QID) | ORAL | Status: DC | PRN
  Administered 2022-01-12 (×2): 650 mg via ORAL
  Filled 2022-01-11 (×2): qty 2

## 2022-01-11 NOTE — Consult Note (Signed)
Consult History and Physical   SERVICE: Antepartum  Patient Name: Claudia Cannon Patient MRN:   [id number]  CC: Pain in right lower quadrant  HPI: Claudia Cannon is a 41 y.o. [id number] with LMP 09/29/21. She is 15.[redacted] weeks pregnant. She reports she woke up around 0130 with RLQ pain that is constant dull pain. She denies vaginal bleeding, LOF or contractions.    Review of Systems: positives in bold GEN:   fevers, chills, weight changes, appetite changes, fatigue, night sweats HEENT:  HA, vision changes, hearing loss, congestion, rhinorrhea, sinus pressure, dysphagia CV:   CP, palpitations PULM:  SOB, cough GI:  abd pain, N/V/D/C GU:  dysuria, urgency, frequency MSK:  arthralgias, myalgias, back pain, swelling SKIN:  rashes, color changes, pallor NEURO:  numbness, weakness, tingling, seizures, dizziness, tremors PSYCH:  depression, anxiety, behavioral problems, confusion  HEME/LYMPH:  easy bruising or bleeding ENDO:  heat/cold intolerance  Past Obstetrical History: OB History     Gravida  6   Para  4   Term  4   Preterm      AB      Living  4      SAB      IAB      Ectopic      Multiple      Live Births  4           Past Gynecologic History: Patient's LMP 09/29/20  Past Medical History: Past Medical History:  Diagnosis Date   Diabetes mellitus without complication (HCC)     Past Surgical History:   Past Surgical History:  Procedure Laterality Date   ECTOPIC PREGNANCY SURGERY Right    TUBAL LIGATION      Family History:  family history includes Chronic Renal Failure in her mother; Diabetes in her mother; Hyperlipidemia in her mother; Hypertension in her mother.  Social History:  Social History   Socioeconomic History   Marital status: Married    Spouse name: Not on file   Number of children: Not on file   Years of education: Not on file   Highest education level: Not on file  Occupational History   Not on file   Tobacco Use   Smoking status: Never   Smokeless tobacco: Never  Vaping Use   Vaping Use: Never used  Substance and Sexual Activity   Alcohol use: No   Drug use: No   Sexual activity: Not on file  Other Topics Concern   Not on file  Social History Narrative   Not on file   Social Determinants of Health   Financial Resource Strain: Not on file  Food Insecurity: Not on file  Transportation Needs: Not on file  Physical Activity: Not on file  Stress: Not on file  Social Connections: Not on file  Intimate Partner Violence: Not on file    Home Medications:  Medications reconciled in EPIC  No current facility-administered medications on file prior to encounter.   Current Outpatient Medications on File Prior to Encounter  Medication Sig Dispense Refill   acetaminophen (TYLENOL) 325 MG tablet Take 650 mg by mouth every 6 (six) hours as needed. Last dose: 6 am.     escitalopram (LEXAPRO) 10 MG tablet Take 10 mg by mouth daily.     hydrocortisone 2.5 % cream Apply topically.     lidocaine (LIDODERM) 5 % 1 patch daily.     loratadine (CLARITIN) 10 MG tablet Take by mouth.     metFORMIN (GLUCOPHAGE-XR) 500 MG  24 hr tablet Take 500 mg by mouth 2 (two) times daily.      Allergies:  No Known Allergies  Physical Exam:  Temp:  [98.4 F (36.9 C)-98.5 F (36.9 C)] 98.4 F (36.9 C) (08/04 1200) Pulse Rate:  [122-126] 122 (08/04 1200) Resp:  [18-24] 18 (08/04 1200) BP: (119-130)/(61-70) 130/61 (08/04 1200) SpO2:  [100 %] 100 % (08/04 1200) Weight:  [68.9 kg] 68.9 kg (08/04 0944)   General Appearance:  Well developed, well nourished, no acute distress, alert and oriented, cooperative and appears stated age HEENT:  Normocephalic atraumatic, extraocular movements intact, moist mucous membranes, neck supple with midline trachea and thyroid without masses Cardiovascular:  Normal S1/S2, regular rate and rhythm, no murmurs, 2+ distal pulses Pulmonary:  clear to auscultation, no wheezes,  rales or rhonchi, symmetric air entry, good air exchange Abdomen:  Bowel sounds present, soft, nontender, nondistended, no abnormal masses or organomegaly, no epigastric pain Back: inspection of back is normal Extremities:  extremities normal, no tenderness, atraumatic, no cyanosis or edema Skin:  normal coloration and turgor, no rashes, no suspicious skin lesions noted  Neurologic:  Cranial nerves 2-12 grossly intact, grossly equal strength and muscle tone, normal speech, no focal findings or movement disorder noted. Psychiatric:  Normal mood and affect, appropriate, no AH/VH   Labs/Studies:   CBC and Coags:  Lab Results  Component Value Date   WBC 20.6 (H) 01/11/2022   NEUTOPHILPCT 90 01/11/2022   EOSPCT 0 01/11/2022   BASOPCT 0 01/11/2022   LYMPHOPCT 5 01/11/2022   HGB 12.3 01/11/2022   HCT 36.5 01/11/2022   MCV 88.0 01/11/2022   PLT 327 01/11/2022   CMP:  Lab Results  Component Value Date   NA 135 01/11/2022   K 3.6 01/11/2022   CL 103 01/11/2022   CO2 21 (L) 01/11/2022   BUN 10 01/11/2022   CREATININE 0.56 01/11/2022   CREATININE 0.57 10/31/2021   CREATININE 0.60 09/20/2020   PROT 7.3 01/11/2022   BILITOT 1.1 01/11/2022   BILIDIR 0.5 09/22/2017   ALT 12 01/11/2022   AST 23 01/11/2022   ALKPHOS 50 01/11/2022    Other Imaging: MR PELVIS WO CONTRAST  Result Date: 01/11/2022 CLINICAL DATA:  Pregnant, pelvic pain, dysuria back pain EXAM: MRI ABDOMEN AND PELVIS WITHOUT CONTRAST TECHNIQUE: Multiplanar multisequence MR imaging of the abdomen and pelvis was performed. No intravenous contrast was administered. COMPARISON:  None Available. FINDINGS: COMBINED FINDINGS FOR BOTH MR ABDOMEN AND PELVIS Lower chest: No acute findings. Hepatobiliary: No mass or other parenchymal abnormality identified. No gallstones. No biliary ductal dilatation. Pancreas: No mass, inflammatory changes, or other parenchymal abnormality identified.No pancreatic ductal dilatation. Spleen:  Within normal  limits in size and appearance. Adrenals/Urinary Tract: Normal adrenal glands. No renal masses or suspicious contrast enhancement identified. No evidence of hydronephrosis. Stomach/Bowel: Visualized portions within the abdomen are unremarkable. Normal appendix (series 9, image 60). Vascular/Lymphatic: No pathologically enlarged lymph nodes identified. No abdominal aortic aneurysm demonstrated. Reproductive: Gravid uterus. Unremarkable appearance of the fetus, on this examination not tailored for the evaluation of fetal anatomy. Posterior placenta. Normal appearance of the ovaries with multiple small follicles. Other:  None. Musculoskeletal: No suspicious osseous lesions identified. IMPRESSION: 1. No noncontrast MR findings of the abdomen or pelvis to explain pain. 2. Specifically, normal appendix.  No hydronephrosis 3. Gravid uterus. Unremarkable appearance of the fetus, on this examination not tailored for the evaluation of fetal anatomy. Posterior placenta. Electronically Signed   By: Bonna Gains.D.  On: 01/11/2022 13:46   MR ABDOMEN WO CONTRAST  Result Date: 01/11/2022 CLINICAL DATA:  Pregnant, pelvic pain, dysuria back pain EXAM: MRI ABDOMEN AND PELVIS WITHOUT CONTRAST TECHNIQUE: Multiplanar multisequence MR imaging of the abdomen and pelvis was performed. No intravenous contrast was administered. COMPARISON:  None Available. FINDINGS: COMBINED FINDINGS FOR BOTH MR ABDOMEN AND PELVIS Lower chest: No acute findings. Hepatobiliary: No mass or other parenchymal abnormality identified. No gallstones. No biliary ductal dilatation. Pancreas: No mass, inflammatory changes, or other parenchymal abnormality identified.No pancreatic ductal dilatation. Spleen:  Within normal limits in size and appearance. Adrenals/Urinary Tract: Normal adrenal glands. No renal masses or suspicious contrast enhancement identified. No evidence of hydronephrosis. Stomach/Bowel: Visualized portions within the abdomen are unremarkable.  Normal appendix (series 9, image 60). Vascular/Lymphatic: No pathologically enlarged lymph nodes identified. No abdominal aortic aneurysm demonstrated. Reproductive: Gravid uterus. Unremarkable appearance of the fetus, on this examination not tailored for the evaluation of fetal anatomy. Posterior placenta. Normal appearance of the ovaries with multiple small follicles. Other:  None. Musculoskeletal: No suspicious osseous lesions identified. IMPRESSION: 1. No noncontrast MR findings of the abdomen or pelvis to explain pain. 2. Specifically, normal appendix.  No hydronephrosis 3. Gravid uterus. Unremarkable appearance of the fetus, on this examination not tailored for the evaluation of fetal anatomy. Posterior placenta. Electronically Signed   By: Jearld Lesch M.D.   On: 01/11/2022 13:46     Assessment / Plan:   Claudia Cannon is a 41 y.o. [id number] who presents with RLQ pain, elevated Lactic acid, WBC's and tachycardia  1. Management per hospitalist 2. No obvious obstetrical issues apparent 3. Fetal wellbeing reassuring per [place] on 01/01/22.  FHT today 160 BPM per midwife  4.. Unremarkable MRI of pelvis/abdomen 5. Patient wants to D/C home d/t to her other children being at home. She states she feel okay enough to be discharged .    Thank you for the opportunity to be involved with this patient's care.  ----- Chari Manning CNM Midwife [facility], Department of OB/GYN [facility]

## 2022-01-11 NOTE — ED Provider Notes (Addendum)
[facility] Provider Note    Event Date/Time   First MD Initiated Contact with Patient 01/11/22 1041     (approximate)   History   Abdominal pain  HPI  Claudia Cannon is a 41 y.o. female  [phone] EGA, diabetes who comes in with lower abdominal pain.  Patient reports increasing pain in her right lower quadrant.  She reports being seen at outside ER recently and being told that everything looked okay but symptoms of been getting worse.  She denies any neck stiffness or confusion.  She denies any vaginal bleeding, vaginal discharge.  She has been with 1 partner her husband.  She reports no urinary discomfort.  Patient denies any respiratory symptoms, cough, shortness of breath, chest pain.  She did take Tylenol last around 6 or 7 AM.   On review of records patient was seen at [facility] emergency room on 01/01/2022.  She had ultrasound showing a single viable intrauterine pregnancy.  At that time she had a urinalysis that was reassuring, white count was 14.5.  Patient was afebrile  Family declined translator.   Physical Exam   Triage Vital Signs: ED Triage Vitals  Enc Vitals Group     BP 01/11/22 0943 119/68     Pulse Rate 01/11/22 0943 (!) 126     Resp 01/11/22 0943 (!) 24     Temp 01/11/22 0943 98.5 F (36.9 C)     Temp src --      SpO2 01/11/22 0943 100 %     Weight 01/11/22 0944 152 lb (68.9 kg)     Height 01/11/22 0944 5' (1.524 m)     Head Circumference --      Peak Flow --      Pain Score 01/11/22 0944 7     Pain Loc --      Pain Edu? --      Excl. in GC? --     Most recent vital signs: Vitals:   01/11/22 0943  BP: 119/68  Pulse: (!) 126  Resp: (!) 24  Temp: 98.5 F (36.9 C)  SpO2: 100%     General: Awake, no distress.  CV:  Good peripheral perfusion.  Resp:  Normal effort.  Abd:  Patient tender in the right lower quadrant. Other:  Had recent pelvic exam a few days ago that was reassuring therefore declined  today Patient has no neck stiffness.  She is mentating well.  ED Results / Procedures / Treatments   Labs (all labs ordered are listed, but only abnormal results are displayed) Labs Reviewed  LACTIC ACID, PLASMA - Abnormal; Notable for the following components:      Result Value   Lactic Acid, Venous 3.0 (*)    All other components within normal limits  COMPREHENSIVE METABOLIC PANEL - Abnormal; Notable for the following components:   CO2 21 (*)    Glucose, Bld 136 (*)    Calcium 8.6 (*)    All other components within normal limits  CBC WITH DIFFERENTIAL/PLATELET - Abnormal; Notable for the following components:   WBC 20.6 (*)    Neutro Abs 18.7 (*)    Abs Immature Granulocytes 0.10 (*)    All other components within normal limits  URINALYSIS, ROUTINE W REFLEX MICROSCOPIC - Abnormal; Notable for the following components:   Color, Urine YELLOW (*)    APPearance CLEAR (*)    Ketones, ur 20 (*)    All other components within normal limits  POC URINE PREG,  ED - Abnormal; Notable for the following components:   Preg Test, Ur POSITIVE (*)    All other components within normal limits  LACTIC ACID, PLASMA     EKG  My interpretation of EKG:  Sinus tachycardia rate of 123 without any ST elevation or T wave versions, normal intervals  RADIOLOGY   PROCEDURES:  Critical Care performed: No  .1-3 Lead EKG Interpretation  Performed by: Concha Se, MD Authorized by: Concha Se, MD     Interpretation: abnormal     ECG rate:  120   ECG rate assessment: tachycardic     Rhythm: sinus tachycardia     Ectopy: none     Conduction: normal      MEDICATIONS ORDERED IN ED: Medications  sodium chloride 0.9 % bolus 1,000 mL (0 mLs Intravenous Stopped 01/11/22 1330)  fentaNYL (SUBLIMAZE) injection 25 mcg (25 mcg Intravenous Given 01/11/22 1119)  lactated ringers bolus 1,000 mL (1,000 mLs Intravenous New Bag/Given 01/11/22 1418)  fentaNYL (SUBLIMAZE) injection 50 mcg (50 mcg  Intravenous Given 01/11/22 1416)     IMPRESSION / MDM / ASSESSMENT AND PLAN / ED COURSE  I reviewed the triage vital signs and the nursing notes.   Patient's presentation is most consistent with acute presentation with potential threat to life or bodily function.   Differential includes appendicitis, COVID, flu.  Patient significantly tachycardic will give some IV fluids.  Labs ordered to evaluate for any electrolyte abnormalities.  Consider UTI but does not seem to be consistent with pyelonephritis.  She is got no right upper quadrant pain to suggest cholecystitis.  CBC elevated at 20,000 with a left shift.  Unclear source so we will hold off on antibiotics yet.  Her lactate is elevated and she is getting IV fluids.  Her urine is without evidence of UTI.  Her procalcitonin is negative making sepsis or bacteremia less likely this could be viral in nature but given the right lower quadrant pain I recommended getting a MRI to evaluate for appendicitis.  Patient has had a recent pelvic exam done that was reassuring and she is been with 1 partner so I have lower suspicion for PID and when I initially saw her in May she had declined testing but we will give her swabs today just to ensure that this is not what is causing this  Patient was counseled on benefits and risk of fentanyl patient given a dose of IV fentanyl.  Patient's MRI is negative but white count is significantly elevated with a left shift.  We will add on mono, viral panel, strep test.  Recommended admission for sepsis rule out initially agreed to being admitted but then after being seen by the hospitalist is now stating that she wants to leave.  She is a least willing to stay for the fluids and the rest of the testing.  She understands the danger to herself and to her baby but she does not want to be admitted.  I tried to explain to her that I would want her to be monitored further given her abnormal heart rate.  She has a capacity make this  decision and the husband's witness of it.  Explained to her that she be likely leaving AGAINST MEDICAL ADVICE and she expressed understanding.  Considered the possibility of PE given continued tachycardic but she continues to deny any shortness of breath or coughing.  I also consider the possibility of meningitis given some vague headaches earlier but her neck is supple and  her procalcitonin is negative so it seems less likely to be meningitis.    Patient will be handed off to oncoming team pending reassessment after fluids and seeing if she will change her mind to stay  The patient is on the cardiac monitor to evaluate for evidence of arrhythmia and/or significant heart rate changes.  Clinical Course as of 01/11/22 1527  Fri Jan 11, 2022  1527 Urinalysis, Routine w reflex microscopic(!) [MF]    Clinical Course User Index [MF] Vanessa [place], MD     FINAL CLINICAL IMPRESSION(S) / ED DIAGNOSES   Final diagnoses:  Sepsis, due to unspecified organism, unspecified whether acute organ dysfunction present [facility])  Pregnancy, unspecified gestational age     Rx / DC Orders   ED Discharge Orders     None        Note:  This document was prepared using Dragon voice recognition software and may include unintentional dictation errors.   Vanessa [place], MD 01/11/22 1518    Vanessa Vaughn, MD 01/11/22 1528    Vanessa [place], MD 01/11/22 [phone]

## 2022-01-11 NOTE — H&P (Signed)
History and Physical    Patient: Claudia Cannon TKZ:[id number] DOB: 01-18-1981 DOA: 01/11/2022 DOS: the patient was seen and examined on 01/11/2022 PCP: Department, [facility]  Patient coming from: Home - lives with husband and children; NOK: Husband, Claudia Cannon, [phone]   Chief Complaint: Abdominal pain  HPI: Claudia Cannon is a 41 y.o. female with medical history significant of DM and current IUP (G6P4 at 15.[redacted] weeks gestation) presenting with abdominal pain.  History was obtained with patient (limited [place]), myself (limited Spanish), and her daughter Claudia Cannon, fluent in both).  The patient ate a [place] in April and had a similar reaction then.  Since then, every few weeks she has a similar episode, 4 total each lasting about 3 days.  She gets headache, RLQ pain, arthralgias, chills, and flushing.  She was last seen for this at the [facility] on 7/25 and was discharged with supportive care.  Current symptoms started yesterday.  No fever despite flushing and feeling hot/cold.  Mild nausea with headaches but no vomiting.  No urinary/GU symptoms other than RLQ and pelvic pain.  These episodes did predate the pregnancy.  She was also seen with similar symptoms on 4/21 at [facility] in [facility] and had +strep test, treated with PCN.    ER Course:  RLQ pain at [redacted] weeks gestation.  Also with HA, nausea.  ?etiology of pain, possible round ligament pain.  Tachycardia throughout despite IVF.  Lactate elevated and improving.  WBC 20.6, no source.  MRI negative.  Procalcitonin is negative.  ?viral illness.  Patient wanted to leave due to child care issues and prefers to stay.  Blood cultures pending.  BP down to 70s, recovered with IVF.       Review of Systems: As mentioned in the history of present illness. All other systems reviewed and are negative. Past Medical History:  Diagnosis Date   Diabetes mellitus without complication (HCC)    Past Surgical  History:  Procedure Laterality Date   ECTOPIC PREGNANCY SURGERY Right    TUBAL LIGATION     Social History:  reports that she has never smoked. She has never used smokeless tobacco. She reports that she does not drink alcohol and does not use drugs.  No Known Allergies  Family History  Problem Relation Age of Onset   Diabetes Mother    Hypertension Mother    Hyperlipidemia Mother    Chronic Renal Failure Mother     Prior to Admission medications   Medication Sig Start Date End Date Taking? Authorizing Provider  acetaminophen (TYLENOL) 325 MG tablet Take 650 mg by mouth every 6 (six) hours as needed. Last dose: 6 am.    [provider]  escitalopram (LEXAPRO) 10 MG tablet Take 10 mg by mouth daily. 08/16/21   [provider]  hydrocortisone 2.5 % cream Apply topically. 08/20/21   [provider]  lidocaine (LIDODERM) 5 % 1 patch daily. 08/20/21   [provider]  loratadine (CLARITIN) 10 MG tablet Take by mouth.    [provider]  metFORMIN (GLUCOPHAGE-XR) 500 MG 24 hr tablet Take 500 mg by mouth 2 (two) times daily. 08/16/21   [provider]    Physical Exam: Vitals:   01/11/22 1635 01/11/22 1700 01/11/22 1730 01/11/22 1948  BP:  (!) 103/59 123/63 123/71  Pulse:  (!) 119 (!) 123 (!) 127  Resp:  (!) 22 (!) 22 (!) 22  Temp: 98.4 F (36.9 C)   98.3 F (  36.8 C)  TempSrc: Oral   Oral  SpO2:  100% 99% 99%  Weight:      Height:       General:  Appears calm and comfortable and is in NAD; flushed and very warm to touch despite normal temp Eyes:  PERRL, EOMI, normal lids, iris ENT:  grossly normal hearing, lips & tongue, mmm Neck:  no LAD, masses or thyromegaly Cardiovascular:  RR with tachycardia, no m/r/g. No LE edema.  Respiratory:   CTA bilaterally with no wheezes/rales/rhonchi.  Normal to mildly increased respiratory effort. Abdomen:  soft, diffuse lower abdominal TTP, ND Skin:  no rash or induration seen on limited exam;  flushed Musculoskeletal:  grossly normal tone BUE/BLE, good ROM, no bony abnormality Psychiatric:  grossly normal mood and affect, speech fluent and appropriate, AOx3 Neurologic:  CN 2-12 grossly intact, moves all extremities in coordinated fashion   Radiological Exams on Admission: Independently reviewed - see discussion in A/P where applicable  MR PELVIS WO CONTRAST  Result Date: 01/11/2022 CLINICAL DATA:  Pregnant, pelvic pain, dysuria back pain EXAM: MRI ABDOMEN AND PELVIS WITHOUT CONTRAST TECHNIQUE: Multiplanar multisequence MR imaging of the abdomen and pelvis was performed. No intravenous contrast was administered. COMPARISON:  None Available. FINDINGS: COMBINED FINDINGS FOR BOTH MR ABDOMEN AND PELVIS Lower chest: No acute findings. Hepatobiliary: No mass or other parenchymal abnormality identified. No gallstones. No biliary ductal dilatation. Pancreas: No mass, inflammatory changes, or other parenchymal abnormality identified.No pancreatic ductal dilatation. Spleen:  Within normal limits in size and appearance. Adrenals/Urinary Tract: Normal adrenal glands. No renal masses or suspicious contrast enhancement identified. No evidence of hydronephrosis. Stomach/Bowel: Visualized portions within the abdomen are unremarkable. Normal appendix (series 9, image 60). Vascular/Lymphatic: No pathologically enlarged lymph nodes identified. No abdominal aortic aneurysm demonstrated. Reproductive: Gravid uterus. Unremarkable appearance of the fetus, on this examination not tailored for the evaluation of fetal anatomy. Posterior placenta. Normal appearance of the ovaries with multiple small follicles. Other:  None. Musculoskeletal: No suspicious osseous lesions identified. IMPRESSION: 1. No noncontrast MR findings of the abdomen or pelvis to explain pain. 2. Specifically, normal appendix.  No hydronephrosis 3. Gravid uterus. Unremarkable appearance of the fetus, on this examination not tailored for the evaluation  of fetal anatomy. Posterior placenta. Electronically Signed   By: Jearld Lesch M.D.   On: 01/11/2022 13:46   MR ABDOMEN WO CONTRAST  Result Date: 01/11/2022 CLINICAL DATA:  Pregnant, pelvic pain, dysuria back pain EXAM: MRI ABDOMEN AND PELVIS WITHOUT CONTRAST TECHNIQUE: Multiplanar multisequence MR imaging of the abdomen and pelvis was performed. No intravenous contrast was administered. COMPARISON:  None Available. FINDINGS: COMBINED FINDINGS FOR BOTH MR ABDOMEN AND PELVIS Lower chest: No acute findings. Hepatobiliary: No mass or other parenchymal abnormality identified. No gallstones. No biliary ductal dilatation. Pancreas: No mass, inflammatory changes, or other parenchymal abnormality identified.No pancreatic ductal dilatation. Spleen:  Within normal limits in size and appearance. Adrenals/Urinary Tract: Normal adrenal glands. No renal masses or suspicious contrast enhancement identified. No evidence of hydronephrosis. Stomach/Bowel: Visualized portions within the abdomen are unremarkable. Normal appendix (series 9, image 60). Vascular/Lymphatic: No pathologically enlarged lymph nodes identified. No abdominal aortic aneurysm demonstrated. Reproductive: Gravid uterus. Unremarkable appearance of the fetus, on this examination not tailored for the evaluation of fetal anatomy. Posterior placenta. Normal appearance of the ovaries with multiple small follicles. Other:  None. Musculoskeletal: No suspicious osseous lesions identified. IMPRESSION: 1. No noncontrast MR findings of the abdomen or pelvis to explain pain. 2. Specifically, normal appendix.  No  hydronephrosis 3. Gravid uterus. Unremarkable appearance of the fetus, on this examination not tailored for the evaluation of fetal anatomy. Posterior placenta. Electronically Signed   By: Jearld Lesch M.D.   On: 01/11/2022 13:46    EKG: Independently reviewed.  Sinus tachycardia with rate 123; no evidence of acute ischemia   Labs on Admission: I have  personally reviewed the available labs and imaging studies at the time of the admission.  Pertinent labs:    Glucose 136 Lactate 3, 2.5 Procalcitonin <0.10 WBC 20.6 GC/Chl negative COVID/flu negative Mono negative Wet prep WNL UA 20 ketones Blood cultures pending GAS negative   Assessment and Plan: Principal Problem:   SIRS (systemic inflammatory response syndrome) (HCC) Active Problems:   Supervision of high risk elderly multigravida in first trimester   Type 2 diabetes mellitus with complication, with long-term current use of insulin (HCC)   Abdominal pain    Assessment and Plan: No notes have been filed under this hospital service. Service: Hospitalist  SIRS -SIRS criteria in this patient includes: Leukocytosis, tachycardia  -Patient has evidence of acute organ failure with elevated lactate >2; recurrent hypotension (SBP < 90 or MAP < 65 x 2 readings) that is not easily explained by another condition. -While awaiting blood cultures, this appears to be a preseptic condition. -Sepsis protocol initiated -However, there is no apparent source of sepsis at this time and so perhaps this is all related to dehydration (ketonuria noted) in pregnancy or underlying autoimmune disorder - recommend outpatient rheum evaluation, although this may limited for the duration of her pregnancy -Blood and urine cultures pending -Will place in observation status with telemetry and continue to monitor -Hold antibiotics for now -Negative HIV and hepatitis/RPR tests at the beginning of pregnancy  -Will trend lactate to ensure improvement  Abdominal pain -Likely round ligament pain vs. autoimmune -No intervention needed at this time with negative MRI -Recent US with single viable IUP -F/u with OB  IUP -High risk pregnancy at [redacted] weeks gestation (pre-viable) -Followed by [facility] -High risk pregnancy given AMA, multigravida, and DM -Needs close ongoing OB f/u but no further involvement by OB at  this time during hospitalization  DM -A1c was 6.7 on 6/5 -Hold metformin -Insulin added for pregnancy, 12 units NPH qhs, will continue -Needs tight glycemic control during pregnancy     Advance Care Planning:   Code Status: Full Code   Consults: OB  DVT Prophylaxis: Lovenox  Family Communication: Daughter was present throughout evaluation, husband was present for the conclusion of the visit  Severity of Illness: The appropriate patient status for this patient is OBSERVATION. Observation status is judged to be reasonable and necessary in order to provide the required intensity of service to ensure the patient's safety. The patient's presenting symptoms, physical exam findings, and initial radiographic and laboratory data in the context of their medical condition is felt to place them at decreased risk for further clinical deterioration. Furthermore, it is anticipated that the patient will be medically stable for discharge from the hospital within 2 midnights of admission.   Author: Jonah Blue, MD 01/11/2022 9:12 PM  For on call review [email or web address].

## 2022-01-11 NOTE — ED Notes (Signed)
Critical Result: Lactic Acid 3.0  Fuller Plan MD aware

## 2022-01-11 NOTE — Progress Notes (Signed)
Called by ER provider to admit this patient who is [redacted] weeks pregnant for suspected sepsis. Patient is seen and examined at bedside and presents for evaluation of right lower quadrant abdominal pain associated with nausea and chills.  Labs reveal marked leukocytosis and she was noted to be tachycardic on the monitor.  MRI of the abdomen and pelvis is unremarkable. Patient was seen and examined at bedside and is acutely ill-appearing.  She is diffusely tender on physical exam According to her and her husband she has had intermittent pain for several weeks and was seen at the [facility], 2 weeks ago for similar pain. I have asked the ER physician to have GYN or surgery evaluate this patient since I do not have an obvious source of sepsis at this time prior to admitting the patient. Discussed with patient and her husband at the bedside and patient request to be discharged. Informed ER physician, Dr Fuller Plan

## 2022-01-11 NOTE — ED Triage Notes (Signed)
PT to ED via POV from home for pelvic pain, headache, body aches and nausea. PT is [redacted] weeks pregnant. PT denies urinary sx besides pain with urination that she attributes to her pelvic pain. PT endorses back pain starting this morning.

## 2022-01-12 DIAGNOSIS — A419 Sepsis, unspecified organism: Secondary | ICD-10-CM

## 2022-01-12 DIAGNOSIS — O09521 Supervision of elderly multigravida, first trimester: Secondary | ICD-10-CM

## 2022-01-12 DIAGNOSIS — R6510 Systemic inflammatory response syndrome (SIRS) of non-infectious origin without acute organ dysfunction: Secondary | ICD-10-CM | POA: Diagnosis not present

## 2022-01-12 DIAGNOSIS — R1031 Right lower quadrant pain: Secondary | ICD-10-CM

## 2022-01-12 DIAGNOSIS — Z3490 Encounter for supervision of normal pregnancy, unspecified, unspecified trimester: Secondary | ICD-10-CM | POA: Diagnosis not present

## 2022-01-12 LAB — CBC
HCT: 30.4 % — ABNORMAL LOW (ref 36.0–46.0)
Hemoglobin: 10.7 g/dL — ABNORMAL LOW (ref 12.0–15.0)
MCH: 30.4 pg (ref 26.0–34.0)
MCHC: 35.2 g/dL (ref 30.0–36.0)
MCV: 86.4 fL (ref 80.0–100.0)
Platelets: 251 10*3/uL (ref 150–400)
RBC: 3.52 MIL/uL — ABNORMAL LOW (ref 3.87–5.11)
RDW: 12.2 % (ref 11.5–15.5)
WBC: 15.4 10*3/uL — ABNORMAL HIGH (ref 4.0–10.5)
nRBC: 0 % (ref 0.0–0.2)

## 2022-01-12 LAB — GLUCOSE, CAPILLARY
Glucose-Capillary: 112 mg/dL — ABNORMAL HIGH (ref 70–99)
Glucose-Capillary: 145 mg/dL — ABNORMAL HIGH (ref 70–99)

## 2022-01-12 LAB — LACTIC ACID, PLASMA: Lactic Acid, Venous: 1.1 mmol/L (ref 0.5–1.9)

## 2022-01-13 LAB — URINE CULTURE: Culture: 10000 — AB

## 2022-01-15 NOTE — Discharge Summary (Signed)
Physician Discharge Summary   Patient: Claudia Cannon MRN: [id number] DOB: 1981/05/10  Admit date:     01/11/2022  Discharge date: 01/12/2022  Discharge Physician: Max Sane   PCP: Department, [facility]   Recommendations at discharge:   Follow-up with outpatient providers as requested  Discharge Diagnoses: Active Problems:   Pregnancy   Type 2 diabetes mellitus with complication, with long-term current use of insulin (HCC)   Abdominal pain  Hospital Course: Assessment and Plan: 41 year old [id number] who is 15.[redacted] weeks pregnant admitted for right lower quadrant pain  Abdominal pain Likely round ligament pain.  Seen by OB and recommends outpatient follow-up with [facility] where she has been following Sepsis and SIRS ruled out-no obvious source of infection Patient was requested to follow-up with rheumatology as an outpatient potentially at [facility] clinic or [facility] if she prefers to evaluate for any autoimmune disease Abdominal pain had improved.  She was tolerating diet and requested for discharge.  She did not have any fever She had a negative HIV and hepatitis test     Consultants: OB  disposition: Home Diet recommendation:  Discharge Diet Orders (From admission, onward)     Start     Ordered   01/12/22 0000  Diet - low sodium heart healthy        01/12/22 1114           Cardiac diet DISCHARGE MEDICATION: Allergies as of 01/12/2022   No Known Allergies      Medication List     STOP taking these medications    escitalopram 10 MG tablet Commonly known as: LEXAPRO       TAKE these medications    acetaminophen 325 MG tablet Commonly known as: TYLENOL Take 650 mg by mouth every 6 (six) hours as needed. Last dose: 6 am.   HumuLIN N KwikPen 100 UNIT/ML Kiwkpen Generic drug: Insulin NPH (Human) (Isophane) Inject 12 Units into the skin at bedtime.   hydrocortisone 2.5 % cream Apply 1 Application topically daily as  needed.   lidocaine 5 % Commonly known as: LIDODERM 1 patch daily as needed.   loratadine 10 MG tablet Commonly known as: CLARITIN Take 10 mg by mouth daily as needed for allergies.   M-Natal Plus 27-1 MG Tabs Take 1 tablet by mouth daily.        Follow-up Information     Department, [facility]. Schedule an appointment as soon as possible for a visit in 1 week(s).   Why: [facility] Discharge F/UP Contact information: [place] [address] [phone]         Gertie Fey, CNM. Schedule an appointment as soon as possible for a visit in 3 day(s).   Specialty: Certified Nurse Midwife Why: [facility] Discharge F/UP Contact information: [place] [address] [phone]         Caroleen Hamman F, MD. Schedule an appointment as soon as possible for a visit in 2 week(s).   Specialty: General Surgery Why: [facility] Discharge F/UP Contact information: [address] [place] [address] [phone]                Discharge Exam: Danley Danker Weights   01/11/22 0944  Weight: 68.9 kg   General:  Appears calm and comfortable and is in NAD Eyes:  PERRL, EOMI, normal lids, iris ENT:  grossly normal hearing, lips & tongue, mmm Neck:  no LAD, masses  or thyromegaly Cardiovascular:  RR with tachycardia, no m/r/g. No LE edema.  Respiratory:   CTA bilaterally with no wheezes/rales/rhonchi.  Normal to mildly increased respiratory effort. Abdomen:  soft, benign Skin:  no rash or induration seen on limited exam; flushed Musculoskeletal:  grossly normal tone BUE/BLE, good ROM, no bony abnormality Psychiatric:  grossly normal mood and affect, speech fluent and appropriate, AOx3 Neurologic:  Alert, awake, non-focal  Condition at discharge: good  The results of significant diagnostics from this hospitalization (including imaging, microbiology, ancillary and laboratory) are  listed below for reference.   Imaging Studies: MR PELVIS WO CONTRAST  Result Date: 01/11/2022 CLINICAL DATA:  Pregnant, pelvic pain, dysuria back pain EXAM: MRI ABDOMEN AND PELVIS WITHOUT CONTRAST TECHNIQUE: Multiplanar multisequence MR imaging of the abdomen and pelvis was performed. No intravenous contrast was administered. COMPARISON:  None Available. FINDINGS: COMBINED FINDINGS FOR BOTH MR ABDOMEN AND PELVIS Lower chest: No acute findings. Hepatobiliary: No mass or other parenchymal abnormality identified. No gallstones. No biliary ductal dilatation. Pancreas: No mass, inflammatory changes, or other parenchymal abnormality identified.No pancreatic ductal dilatation. Spleen:  Within normal limits in size and appearance. Adrenals/Urinary Tract: Normal adrenal glands. No renal masses or suspicious contrast enhancement identified. No evidence of hydronephrosis. Stomach/Bowel: Visualized portions within the abdomen are unremarkable. Normal appendix (series 9, image 60). Vascular/Lymphatic: No pathologically enlarged lymph nodes identified. No abdominal aortic aneurysm demonstrated. Reproductive: Gravid uterus. Unremarkable appearance of the fetus, on this examination not tailored for the evaluation of fetal anatomy. Posterior placenta. Normal appearance of the ovaries with multiple small follicles. Other:  None. Musculoskeletal: No suspicious osseous lesions identified. IMPRESSION: 1. No noncontrast MR findings of the abdomen or pelvis to explain pain. 2. Specifically, normal appendix.  No hydronephrosis 3. Gravid uterus. Unremarkable appearance of the fetus, on this examination not tailored for the evaluation of fetal anatomy. Posterior placenta. Electronically Signed   By: Delanna Ahmadi M.D.   On: 01/11/2022 13:46   MR ABDOMEN WO CONTRAST  Result Date: 01/11/2022 CLINICAL DATA:  Pregnant, pelvic pain, dysuria back pain EXAM: MRI ABDOMEN AND PELVIS WITHOUT CONTRAST TECHNIQUE: Multiplanar multisequence MR  imaging of the abdomen and pelvis was performed. No intravenous contrast was administered. COMPARISON:  None Available. FINDINGS: COMBINED FINDINGS FOR BOTH MR ABDOMEN AND PELVIS Lower chest: No acute findings. Hepatobiliary: No mass or other parenchymal abnormality identified. No gallstones. No biliary ductal dilatation. Pancreas: No mass, inflammatory changes, or other parenchymal abnormality identified.No pancreatic ductal dilatation. Spleen:  Within normal limits in size and appearance. Adrenals/Urinary Tract: Normal adrenal glands. No renal masses or suspicious contrast enhancement identified. No evidence of hydronephrosis. Stomach/Bowel: Visualized portions within the abdomen are unremarkable. Normal appendix (series 9, image 60). Vascular/Lymphatic: No pathologically enlarged lymph nodes identified. No abdominal aortic aneurysm demonstrated. Reproductive: Gravid uterus. Unremarkable appearance of the fetus, on this examination not tailored for the evaluation of fetal anatomy. Posterior placenta. Normal appearance of the ovaries with multiple small follicles. Other:  None. Musculoskeletal: No suspicious osseous lesions identified. IMPRESSION: 1. No noncontrast MR findings of the abdomen or pelvis to explain pain. 2. Specifically, normal appendix.  No hydronephrosis 3. Gravid uterus. Unremarkable appearance of the fetus, on this examination not tailored for the evaluation of fetal anatomy. Posterior placenta. Electronically Signed   By: Delanna Ahmadi M.D.   On: 01/11/2022 13:46    Microbiology: Results for orders placed or performed during the hospital encounter of 01/11/22  Urine Culture     Status: Abnormal   Collection Time: 01/11/22  9:49 AM   Specimen: Urine, Random  Result Value Ref Range Status   Specimen Description   Final    URINE, RANDOM Performed at [facility], [address] [place], [place] [address]    Special Requests   Final    NONE Performed at [facility],  [address] [place], [place] [address]    Culture (A)  Final    10,000 COLONIES/mL GROUP B STREP(S.AGALACTIAE)ISOLATED TESTING AGAINST S. AGALACTIAE NOT ROUTINELY PERFORMED DUE TO PREDICTABILITY OF AMP/PEN/VAN SUSCEPTIBILITY. Performed at [facility], [address], [place], [place] [address]    Report Status 01/13/2022 FINAL  Final  Resp Panel by RT-PCR (Flu A&B, Covid) Anterior Nasal Swab     Status: None   Collection Time: 01/11/22 11:03 AM   Specimen: Anterior Nasal Swab  Result Value Ref Range Status   SARS Coronavirus 2 by RT PCR NEGATIVE NEGATIVE Final    Comment: (NOTE) SARS-CoV-2 target nucleic acids are NOT DETECTED.  The SARS-CoV-2 RNA is generally detectable in upper respiratory specimens during the acute phase of infection. The lowest concentration of SARS-CoV-2 viral copies this assay can detect is 138 copies/mL. A negative result does not preclude SARS-Cov-2 infection and should not be used as the sole basis for treatment or other patient management decisions. A negative result may occur with  improper specimen collection/handling, submission of specimen other than nasopharyngeal swab, presence of viral mutation(s) within the areas targeted by this assay, and inadequate number of viral copies(<138 copies/mL). A negative result must be combined with clinical observations, patient history, and epidemiological information. The expected result is Negative.  Fact Sheet for Patients:  BloggerCourse.com  Fact Sheet for Healthcare Providers:  SeriousBroker.it  This test is no t yet approved or cleared by the [place] FDA and  has been authorized for detection and/or diagnosis of SARS-CoV-2 by FDA under an Emergency Use Authorization (EUA). This EUA will remain  in effect (meaning this test can be used) for the duration of the COVID-19 declaration under Section 564(b)(1) of the Act, 21 U.S.C.section  360bbb-3(b)(1), unless the authorization is terminated  or revoked sooner.       Influenza A by PCR NEGATIVE NEGATIVE Final   Influenza B by PCR NEGATIVE NEGATIVE Final    Comment: (NOTE) The Xpert Xpress SARS-CoV-2/FLU/RSV plus assay is intended as an aid in the diagnosis of influenza from Nasopharyngeal swab specimens and should not be used as a sole basis for treatment. Nasal washings and aspirates are unacceptable for Xpert Xpress SARS-CoV-2/FLU/RSV testing.  Fact Sheet for Patients: BloggerCourse.com  Fact Sheet for Healthcare Providers: SeriousBroker.it  This test is not yet approved or cleared by the [place] FDA and has been authorized for detection and/or diagnosis of SARS-CoV-2 by FDA under an Emergency Use Authorization (EUA). This EUA will remain in effect (meaning this test can be used) for the duration of the COVID-19 declaration under Section 564(b)(1) of the Act, 21 U.S.C. section 360bbb-3(b)(1), unless the authorization is terminated or revoked.  Performed at [facility], [address] [place], [place] [address]   Chlamydia/NGC rt PCR [facility] only)     Status: None   Collection Time: 01/11/22 11:03 AM   Specimen: Cervical/Vaginal swab  Result Value Ref Range Status   Specimen source GC/Chlam ENDOCERVICAL  Final   Chlamydia Tr NOT DETECTED NOT DETECTED Final   N gonorrhoeae NOT DETECTED NOT DETECTED Final    Comment: (NOTE) This CT/NG assay has not been evaluated  in patients with a history of  hysterectomy. Performed at [facility], [address] [place], [place] [address]   Blood culture (routine x 2)     Status: None (Preliminary result)   Collection Time: 01/11/22 11:04 AM   Specimen: BLOOD  Result Value Ref Range Status   Specimen Description BLOOD RIGHT [facility]  Final   Special Requests   Final    BOTTLES DRAWN AEROBIC AND ANAEROBIC Blood Culture results may not be optimal  due to an excessive volume of blood received in culture bottles   Culture   Final    NO GROWTH 4 DAYS Performed at [facility], [address], [place], [place] [address]    Report Status PENDING  Incomplete  Blood culture (routine x 2)     Status: None (Preliminary result)   Collection Time: 01/11/22 11:04 AM   Specimen: BLOOD  Result Value Ref Range Status   Specimen Description BLOOD LEFT AC  Final   Special Requests   Final    BOTTLES DRAWN AEROBIC AND ANAEROBIC Blood Culture results may not be optimal due to an excessive volume of blood received in culture bottles   Culture   Final    NO GROWTH 4 DAYS Performed at [facility], [address] [place], [place] [address]    Report Status PENDING  Incomplete  Wet prep, genital     Status: None   Collection Time: 01/11/22 12:37 PM  Result Value Ref Range Status   Yeast Wet Prep HPF POC NONE SEEN NONE SEEN Final   Trich, Wet Prep NONE SEEN NONE SEEN Final   Clue Cells Wet Prep HPF POC NONE SEEN NONE SEEN Final   WBC, Wet Prep HPF POC <10 <10 Final   Sperm NONE SEEN  Final    Comment: Performed at [facility], [address] [place], [place] [address]  Group A Strep by PCR ([facility] Only)     Status: None   Collection Time: 01/11/22  4:30 PM   Specimen: Throat; Sterile Swab  Result Value Ref Range Status   Group A Strep by PCR NOT DETECTED NOT DETECTED Final    Comment: Performed at [facility], [address] [place], [place] [address]    Labs: CBC: Recent Labs  Lab 01/11/22 0949 01/12/22 0419  WBC 20.6* 15.4*  NEUTROABS 18.7*  --   HGB 12.3 10.7*  HCT 36.5 30.4*  MCV 88.0 86.4  PLT 327 251   Basic Metabolic Panel: Recent Labs  Lab 01/11/22 0949  NA 135  K 3.6  CL 103  CO2 21*  GLUCOSE 136*  BUN 10  CREATININE 0.56  CALCIUM 8.6*   Liver Function Tests: Recent Labs  Lab 01/11/22 0949  AST 23  ALT 12  ALKPHOS 50  BILITOT 1.1  PROT 7.3  ALBUMIN 3.8    CBG: Recent Labs  Lab 01/11/22 2230 01/12/22 0730 01/12/22 1139  GLUCAP 137* 112* 145*    Discharge time spent: greater than 30 minutes.  Signed: Delfino Lovett, MD [facility] 01/15/2022

## 2022-01-16 LAB — CULTURE, BLOOD (ROUTINE X 2)
Culture: NO GROWTH
Culture: NO GROWTH

## 2022-01-18 ENCOUNTER — Encounter: Payer: Self-pay | Admitting: *Deleted

## 2022-01-18 ENCOUNTER — Encounter: Payer: Medicaid Other | Attending: Obstetrics and Gynecology | Admitting: *Deleted

## 2022-01-18 VITALS — BP 102/60 | Ht 60.0 in | Wt 151.6 lb

## 2022-01-18 DIAGNOSIS — Z794 Long term (current) use of insulin: Secondary | ICD-10-CM | POA: Insufficient documentation

## 2022-01-18 DIAGNOSIS — Z3A15 15 weeks gestation of pregnancy: Secondary | ICD-10-CM | POA: Diagnosis not present

## 2022-01-18 DIAGNOSIS — O24111 Pre-existing diabetes mellitus, type 2, in pregnancy, first trimester: Secondary | ICD-10-CM | POA: Insufficient documentation

## 2022-01-18 DIAGNOSIS — O24112 Pre-existing diabetes mellitus, type 2, in pregnancy, second trimester: Secondary | ICD-10-CM | POA: Insufficient documentation

## 2022-01-18 DIAGNOSIS — Z713 Dietary counseling and surveillance: Secondary | ICD-10-CM | POA: Diagnosis not present

## 2022-01-18 NOTE — Patient Instructions (Signed)
Read booklet on Gestational Diabetes Follow Gestational Meal Planning Guidelines Don't skip meals - eat 1 protein and 1 carbohydrate serving Always carry glucose tablets or peppermints and a snack Hold insulin pen in place for 5-10 seconds after injection Rotate injection sites Change pen needle daily Complete a 3 Day Food Record and bring to next appointment Check blood sugars 4 x day - before breakfast and 2 hrs after every meal and record  Bring blood sugar log to all appointments Purchase urine ketone strips if instructed by MD and check urine ketones every am:  If + increase bedtime snack to 1 protein and 2 carbohydrate servings Walk 20-30 minutes at least 5 x week if permitted by MD

## 2022-01-18 NOTE — Progress Notes (Signed)
Diabetes Self-Management Education  Visit Type: First/Initial  Appt. Start Time: 0955 Appt. End Time: 1030  01/18/2022  Ms. Claudia Cannon, identified by name and date of birth, is a 41 y.o. female with a diagnosis of Diabetes: Type 2 (Pregnant).   ASSESSMENT  Blood pressure 102/60, height 5' (1.524 m), weight 151 lb 9.6 oz (68.8 kg), last menstrual period 09/29/2021, estimated date of delivery 07/06/2022 Body mass index is 29.61 kg/m.   Diabetes Self-Management Education - 01/18/22 1146       Visit Information   Visit Type First/Initial      Initial Visit   Diabetes Type Type 2   Pregnant   Date Diagnosed 4 years ago    Are you currently following a meal plan? Yes    What type of meal plan do you follow? "eat less portions/servings"    Are you taking your medications as prescribed? Yes      Health Coping   How would you rate your overall health? Good      Psychosocial Assessment   Patient Belief/Attitude about Diabetes Motivated to manage diabetes    What is the hardest part about your diabetes right now, causing you the most concern, or is the most worrisome to you about your diabetes?   Making healty food and beverage choices    Self-care barriers English as a second language    Self-management support Doctor's office    Other persons present Acupuncturist with [facility]   Patient Concerns Nutrition/Meal planning;Medication;Monitoring    Special Needs Other (comment)   materials in Spanish; she understands some English but responds better in Spanish   Preferred Learning Style Other (comment)   talking/discussion   Learning Readiness Change in progress    How often do you need to have someone help you when you read instructions, pamphlets, or other written materials from your doctor or pharmacy? 1 - Never   if written in Spanish   What is the last grade level you completed in school? 6th      Pre-Education Assessment   Patient understands the diabetes  disease and treatment process. Needs Instruction    Patient understands incorporating nutritional management into lifestyle. Needs Instruction    Patient undertands incorporating physical activity into lifestyle. Needs Instruction    Patient understands using medications safely. Needs Instruction    Patient understands monitoring blood glucose, interpreting and using results Comprehends key points    Patient understands prevention, detection, and treatment of acute complications. Needs Instruction    Patient understands prevention, detection, and treatment of chronic complications. Needs Instruction    Patient understands how to develop strategies to address psychosocial issues. Needs Instruction    Patient understands how to develop strategies to promote health/change behavior. Needs Instruction      Complications   Last HgB A1C per patient/outside source 6.7 %   11/12/21   How often do you check your blood sugar? 3-4 times/day   she didn't bring BG records   Fasting Blood glucose range (mg/dL) 07-371   pt reports FBG's 80-95 mg/dL   Postprandial Blood glucose range (mg/dL) 06-269   she reports pp's less than 120's mg/dL   Number of hypoglycemic episodes per month 0   no symptoms since MD took her off Glipizide   Have you had a dilated eye exam in the past 12 months? Yes    Have you had a dental exam in the past 12 months? Yes    Are you checking  your feet? Yes    How many days per week are you checking your feet? 7      Dietary Intake   Breakfast eggs, beans, 2 tortillas    Snack (morning) 0-1 snacks/day - fruit or crackers    Lunch beans, rice, chicken, fish    Dinner sometimes skips or eats fruit (guava, peaches, grapes) - grilled chicken, fish, [place]; potatoes, peas, beans, corn, salads, tomatoes, cuccumbers, squash, zucchini, broccoli    Beverage(s) water      Activity / Exercise   Activity / Exercise Type ADL's      Patient Education   Previous Diabetes Education No    Disease  Pathophysiology Definition of diabetes, type 1 and 2, and the diagnosis of diabetes;Factors that contribute to the development of diabetes;Explored patient's options for treatment of their diabetes    Healthy Eating Role of diet in the treatment of diabetes and the relationship between the three main macronutrients and blood glucose level;Food label reading, portion sizes and measuring food.;Reviewed blood glucose goals for pre and post meals and how to evaluate the patients' food intake on their blood glucose level.;Meal timing in regards to the patients' current diabetes medication.    Being Active Role of exercise on diabetes management, blood pressure control and cardiac health.    Medications Taught/reviewed insulin/injectables, injection, site rotation, insulin/injectables storage and needle disposal.;Reviewed patients medication for diabetes, action, purpose, timing of dose and side effects.   Pt has been using the same pen needle since starting insulin.   Monitoring Purpose and frequency of SMBG.;Taught/discussed recording of test results and interpretation of SMBG.;Identified appropriate SMBG and/or A1C goals.;Ketone testing, when, how.    Acute complications Taught prevention, symptoms, and  treatment of hypoglycemia - the 15 rule.    Chronic complications Relationship between chronic complications and blood glucose control    Diabetes Stress and Support Identified and addressed patients feelings and concerns about diabetes    Preconception care Pregnancy and GDM  Role of pre-pregnancy blood glucose control on the development of the fetus;Reviewed with patient blood glucose goals with pregnancy;Role of family planning for patients with diabetes      Individualized Goals (developed by patient)   Reducing Risk Other (comment)   Decrease medications, prevent diabetes complications     Outcomes   Expected Outcomes Demonstrated interest in learning. Expect positive outcomes    Future DMSE 2 wks          Individualized Plan for Diabetes Self-Management Training:   Learning Objective:  Patient will have a greater understanding of diabetes self-management. Patient education plan is to attend individual and/or group sessions per assessed needs and concerns.   Plan:   Read booklet on Gestational Diabetes Follow Gestational Meal Planning Guidelines Don't skip meals - eat 1 protein and 1 carbohydrate serving Always carry glucose tablets or peppermints and a snack Hold insulin pen in place for 5-10 seconds after injection Rotate injection sites Change pen needle daily Complete a 3 Day Food Record and bring to next appointment Check blood sugars 4 x day - before breakfast and 2 hrs after every meal and record  Bring blood sugar log to all appointments Purchase urine ketone strips if instructed by MD and check urine ketones every am:  If + increase bedtime snack to 1 protein and 2 carbohydrate servings Walk 20-30 minutes at least 5 x week if permitted by MD  Expected Outcomes:  Demonstrated interest in learning. Expect positive outcomes  Education material provided:  Gestational Booklet  Gestational Meal Planning Guidelines Simple Meal Plan 3 Day Food Record Goals for a Healthy Pregnancy Glucose tablets Symptoms, causes and treatments of Hypoglycemia 3 packs pen needles 4 mm, 32 g (BD)     If problems or questions, patient to contact team via:   Sharion Settler, RN, CCM, CDCES [phone]  Future DSME appointment: 2 wks January 30, 2022 with the dietitian

## 2022-01-21 ENCOUNTER — Other Ambulatory Visit: Payer: Self-pay | Admitting: Obstetrics and Gynecology

## 2022-01-21 DIAGNOSIS — Z3689 Encounter for other specified antenatal screening: Secondary | ICD-10-CM

## 2022-01-30 ENCOUNTER — Ambulatory Visit: Payer: Medicaid Other | Admitting: Dietician

## 2022-01-30 ENCOUNTER — Telehealth: Payer: Self-pay | Admitting: Dietician

## 2022-01-30 NOTE — Telephone Encounter (Signed)
Patient called to cancel her 8:15am appt today due to conflicting appt. [facility] interpreter Karl Luke was present and rescheduled the appointment to 02/14/22, 3:30pm.

## 2022-02-14 ENCOUNTER — Encounter: Payer: Medicaid Other | Attending: Obstetrics and Gynecology | Admitting: Dietician

## 2022-02-14 ENCOUNTER — Encounter: Payer: Self-pay | Admitting: Dietician

## 2022-02-14 VITALS — BP 108/66 | Ht 60.0 in | Wt 152.1 lb

## 2022-02-14 DIAGNOSIS — O24112 Pre-existing diabetes mellitus, type 2, in pregnancy, second trimester: Secondary | ICD-10-CM | POA: Insufficient documentation

## 2022-02-14 NOTE — Progress Notes (Signed)
Patient forgot BG record; recalls fasting BGs ranging 86-90s , and post-meal BGs ranging mostly in 110s Patient's food recall indicates eating at regular intervals, mostly balanced meals with some possibly low in protein. She seems to be controlling carb intake well with meals and snacks. Provided basic balanced meal plan, and wrote individualized menus based on patient's food preferences. Advised inclusion of protein source with every meal and snack, discussed increased protein needs during pregnancy as well as effect on BG control. Reviewed proper treatment for hypoglycemia; patient denies any hypoglycemic episodes. Instructed patient on food safety, including avoidance of Listeriosis, and limiting mercury from fish. Discussed importance of maintaining healthy lifestyle habits after pregnancy for ongoing BG control.

## 2022-02-14 NOTE — Patient Instructions (Signed)
Great job eating healthy foods and balanced meals Keep working to make sure you have a protein food with every meals and with snacks when eating fruit.

## 2022-02-26 ENCOUNTER — Ambulatory Visit (HOSPITAL_BASED_OUTPATIENT_CLINIC_OR_DEPARTMENT_OTHER): Payer: Medicaid Other

## 2022-02-26 ENCOUNTER — Ambulatory Visit: Payer: Medicaid Other | Attending: Maternal & Fetal Medicine | Admitting: Maternal & Fetal Medicine

## 2022-02-26 ENCOUNTER — Other Ambulatory Visit: Payer: Self-pay

## 2022-02-26 DIAGNOSIS — O09522 Supervision of elderly multigravida, second trimester: Secondary | ICD-10-CM | POA: Diagnosis not present

## 2022-02-26 DIAGNOSIS — Z3A21 21 weeks gestation of pregnancy: Secondary | ICD-10-CM | POA: Insufficient documentation

## 2022-02-26 DIAGNOSIS — O24112 Pre-existing diabetes mellitus, type 2, in pregnancy, second trimester: Secondary | ICD-10-CM | POA: Diagnosis not present

## 2022-02-26 DIAGNOSIS — Z7984 Long term (current) use of oral hypoglycemic drugs: Secondary | ICD-10-CM

## 2022-02-26 DIAGNOSIS — E118 Type 2 diabetes mellitus with unspecified complications: Secondary | ICD-10-CM | POA: Diagnosis not present

## 2022-02-26 DIAGNOSIS — Z794 Long term (current) use of insulin: Secondary | ICD-10-CM | POA: Insufficient documentation

## 2022-02-26 DIAGNOSIS — O09292 Supervision of pregnancy with other poor reproductive or obstetric history, second trimester: Secondary | ICD-10-CM | POA: Insufficient documentation

## 2022-02-26 NOTE — Progress Notes (Signed)
MFM Consultation:  Ms. Alba Destine is a 41 yo G6P4 at [place] 3d with an EDD of 07/06/22. She is seen at the request of Hassan Buckler, CNM.  She is doing well today without complaints.  She had low risk NIPS. She is not taking low dose ASA. She had normal CBC and CMP but no UPC.   Ms. Alba Destine pregnancy related issues include:  1) Advanced Maternal Age She will be 41 at the time of delivery.  2) Type 2 DM currently on Metformin 500 mg BID and Humilin 12 u at night.  She reports FBS < 95 and 2 hr PP < 120 mg/dL. Her most recent prenatal visit reported 4/15 elevated FBS where as her pp were mostly normal. She again reports that for the last week have been normal      02/26/2022    8:05 AM 02/14/2022    3:25 PM 01/18/2022   10:03 AM  Vitals with BMI  Height _0  _1  _2   Weight 154 lbs 152 lbs 2 oz 151 lbs 10 oz  BMI 30.08 10.1 75.10  Systolic 258 527 782  Diastolic 65 66 60  Pulse 88     OB History  Gravida Para Term Preterm AB Living  _3 0 0 4  SAB IAB Ectopic Multiple Live Births  0 0 0 0 4    # Outcome Date GA Lbr Len/2nd Weight Sex Delivery Anes PTL Lv  6 Current           5 Term      Vag-Spont  N LIV  4 Term      Vag-Spont  N LIV  3 Term      Vag-Spont  N LIV  2 Term      Vag-Spont  N LIV  1 Gravida            Past Medical History:  Diagnosis Date   Diabetes mellitus without complication (HCC)    Past Surgical History:  Procedure Laterality Date   ECTOPIC PREGNANCY SURGERY Right    TUBAL LIGATION     Social History   Socioeconomic History   Marital status: Married    Spouse name: Terrial Rhodes   Number of children: 4   Years of education: Not on file   Highest education level: Not on file  Occupational History   Not on file  Tobacco Use   Smoking status: Never   Smokeless tobacco: Never  Vaping Use   Vaping Use: Never used  Substance and Sexual Activity   Alcohol use: No   Drug use: No   Sexual activity: Yes  Other Topics Concern   Not on file  Social  History Narrative   Not on file   Social Determinants of Health   Financial Resource Strain: Not on file  Food Insecurity: Not on file  Transportation Needs: Not on file  Physical Activity: Not on file  Stress: Not on file  Social Connections: Not on file  Intimate Partner Violence: Not on file   Family History  Problem Relation Age of Onset   Diabetes Mother    Hypertension Mother    Hyperlipidemia Mother    Chronic Renal Failure Mother     Current Outpatient Medications (Endocrine & Metabolic):    HUMULIN N KWIKPEN 100 UNIT/ML KwikPen, Inject 12 Units into the skin at bedtime.   metFORMIN (GLUCOPHAGE) 500 MG tablet, Take 500 mg by mouth 2 (two) times daily with a meal.   Current Outpatient  Medications (Respiratory):    loratadine (CLARITIN) 10 MG tablet, Take 10 mg by mouth daily as needed for allergies. (Patient not taking: Reported on 02/14/2022)  Current Outpatient Medications (Analgesics):    acetaminophen (TYLENOL) 325 MG tablet, Take 650 mg by mouth every 6 (six) hours as needed. Last dose: 6 am.   aspirin 81 MG chewable tablet, Chew 81 mg by mouth daily. Added on 9/19 by MFM   Current Outpatient Medications (Other):    ACCU-CHEK AVIVA PLUS test strip, SMARTSIG:Via Meter   Blood Glucose Monitoring Suppl (ACCU-CHEK AVIVA PLUS) w/Device KIT, Use 1 unit as directed once a day as directed E11.9   hydrocortisone 2.5 % cream, Apply 1 Application topically daily as needed. (Patient not taking: Reported on 02/26/2022)   lidocaine (LIDODERM) 5 %, 1 patch daily as needed. (Patient not taking: Reported on 02/26/2022)   Prenatal Vit-Fe Fumarate-FA (M-NATAL PLUS) 27-1 MG TABS, Take 1 tablet by mouth daily.  No Known Allergies   Imaging: Single intrauterine pregnancy with measurements consistent with dates.  There is normal anatomy without markers of aneuploidy. There is good fetal movement and amniotic fluid volume.  Impression/Counseling:  1) AMA I discussed with Ms.  Alba Destine that there is an increased risk for aneuploidy, however, given her low risk NIPS she declined additional testing.   In addition we discussed the increased risk for gestational diabetes, hypertension in pregnancy, and fetal growth restriction.  She is not taking low dose ASA for preeclampsia prevention but will begin today.  Lastly, she declined meeting with our genetic counselor or to perform carrier screening.  2) Type 2 DM.   Ms. Alba Destine is familiar with diabetes management she was able to communicate her goals and share with me her nutrition strategy.  Her sugars are overall good and trending well with her current medical therapy.  We discussed the complications in pregnancy that include fetal macrosomia, preeclampsia, neonatal hypoglycemia, fetal cardiac defects, cesarean delivery, and birth trauma for pregnancies with uncontrolled blood sugar.  Given this we recommend serial growth exams every 4 weeks with initiating of weekly fetal testing at 32 weeks.  A fetal echocardiogram referral was placed at [facility] to be performed in 3-4 weeks.   Again she will initiate daily low dose ASA to reduce her risk for preeclampsia.   I spent 30 minutes with > 50% in face to face consultation.  Vikki Ports, MD

## 2022-03-19 DIAGNOSIS — O24119 Pre-existing diabetes mellitus, type 2, in pregnancy, unspecified trimester: Secondary | ICD-10-CM | POA: Insufficient documentation

## 2022-03-20 ENCOUNTER — Other Ambulatory Visit
Admission: RE | Admit: 2022-03-20 | Discharge: 2022-03-20 | Disposition: A | Discharge status: Home / Self Care | Payer: Medicaid Other | Source: Ambulatory Visit | Attending: Rheumatology | Admitting: Rheumatology

## 2022-03-20 DIAGNOSIS — R509 Fever, unspecified: Secondary | ICD-10-CM | POA: Diagnosis present

## 2022-03-20 DIAGNOSIS — M791 Myalgia, unspecified site: Secondary | ICD-10-CM | POA: Insufficient documentation

## 2022-03-20 DIAGNOSIS — M2550 Pain in unspecified joint: Secondary | ICD-10-CM | POA: Insufficient documentation

## 2022-03-20 DIAGNOSIS — R5382 Chronic fatigue, unspecified: Secondary | ICD-10-CM | POA: Diagnosis present

## 2022-03-20 LAB — D-DIMER, QUANTITATIVE: D-Dimer, Quant: 0.84 ug/mL-FEU — ABNORMAL HIGH (ref 0.00–0.50)

## 2022-04-05 ENCOUNTER — Other Ambulatory Visit: Payer: Self-pay

## 2022-04-05 ENCOUNTER — Observation Stay
Admission: EM | Admit: 2022-04-05 | Discharge: 2022-04-05 | Disposition: A | Discharge status: Home / Self Care | Payer: Medicaid Other | Attending: Obstetrics and Gynecology | Admitting: Obstetrics and Gynecology

## 2022-04-05 ENCOUNTER — Encounter: Payer: Self-pay | Admitting: Obstetrics and Gynecology

## 2022-04-05 DIAGNOSIS — E119 Type 2 diabetes mellitus without complications: Secondary | ICD-10-CM | POA: Insufficient documentation

## 2022-04-05 DIAGNOSIS — Z794 Long term (current) use of insulin: Secondary | ICD-10-CM | POA: Diagnosis not present

## 2022-04-05 DIAGNOSIS — O24112 Pre-existing diabetes mellitus, type 2, in pregnancy, second trimester: Secondary | ICD-10-CM | POA: Diagnosis not present

## 2022-04-05 DIAGNOSIS — O23592 Infection of other part of genital tract in pregnancy, second trimester: Secondary | ICD-10-CM | POA: Insufficient documentation

## 2022-04-05 DIAGNOSIS — R109 Unspecified abdominal pain: Secondary | ICD-10-CM | POA: Insufficient documentation

## 2022-04-05 DIAGNOSIS — Z79899 Other long term (current) drug therapy: Secondary | ICD-10-CM | POA: Insufficient documentation

## 2022-04-05 DIAGNOSIS — Z7982 Long term (current) use of aspirin: Secondary | ICD-10-CM | POA: Insufficient documentation

## 2022-04-05 DIAGNOSIS — O09512 Supervision of elderly primigravida, second trimester: Secondary | ICD-10-CM | POA: Insufficient documentation

## 2022-04-05 DIAGNOSIS — Z1152 Encounter for screening for COVID-19: Secondary | ICD-10-CM | POA: Diagnosis not present

## 2022-04-05 DIAGNOSIS — O26892 Other specified pregnancy related conditions, second trimester: Secondary | ICD-10-CM | POA: Diagnosis present

## 2022-04-05 DIAGNOSIS — Z7984 Long term (current) use of oral hypoglycemic drugs: Secondary | ICD-10-CM | POA: Diagnosis not present

## 2022-04-05 DIAGNOSIS — Z3A26 26 weeks gestation of pregnancy: Secondary | ICD-10-CM | POA: Insufficient documentation

## 2022-04-05 DIAGNOSIS — O0992 Supervision of high risk pregnancy, unspecified, second trimester: Principal | ICD-10-CM | POA: Insufficient documentation

## 2022-04-05 LAB — RESP PANEL BY RT-PCR (FLU A&B, COVID) ARPGX2
Influenza A by PCR: NEGATIVE
Influenza B by PCR: NEGATIVE
SARS Coronavirus 2 by RT PCR: NEGATIVE

## 2022-04-05 LAB — URINALYSIS, COMPLETE (UACMP) WITH MICROSCOPIC
Bacteria, UA: NONE SEEN
Bilirubin Urine: NEGATIVE
Glucose, UA: NEGATIVE mg/dL
Hgb urine dipstick: NEGATIVE
Ketones, ur: 80 mg/dL — AB
Nitrite: NEGATIVE
Protein, ur: 100 mg/dL — AB
Specific Gravity, Urine: 1.027 (ref 1.005–1.030)
pH: 5 (ref 5.0–8.0)

## 2022-04-05 LAB — WET PREP, GENITAL
Clue Cells Wet Prep HPF POC: NONE SEEN
Sperm: NONE SEEN
Trich, Wet Prep: NONE SEEN
WBC, Wet Prep HPF POC: >=10 — AB (ref <10)

## 2022-04-05 LAB — CHLAMYDIA/NGC RT PCR (ARMC ONLY)
Chlamydia Tr: NOT DETECTED
N gonorrhoeae: NOT DETECTED

## 2022-04-05 MED ORDER — FLUCONAZOLE 150 MG PO TABS: 150.0000 mg | ORAL_TABLET | Freq: Once | ORAL | 0 refills | Status: AC

## 2022-04-05 NOTE — OB Triage Note (Signed)
Patient presents to OBS1 for abdominal pain at 26/6. She is having pain in her lower abdomen that she is rating a 4/10. It started at 1900 last night (04/04/2022) and was previously pain of a 7/10 at this time. Patient reported that she took 500 mg of tylenol at that time and that improved her pain. She also complains of burning when she urinates and states that this has been going on for 2 days. Patient denies leaking of fluid and bloody show, but does complain of slight pressure in her vagina.

## 2022-04-05 NOTE — Discharge Summary (Signed)
Claudia Cannon is a 41 y.o. female. She is at 62w6dgestation. Patient's last menstrual period was 09/29/2021 (approximate). Estimated Date of Delivery: 07/06/22  [facility]  Current pregnancy complicated by:  Advanced maternal age, 89yoat delivery Varicella NON-immune, needs PP vaccination Hx infertility- Tubal;  s/p 4 term SVDs, then BTL done 2012,  Tubal reanastomosis 06/2018,  right ectopic preg 09/2020 with right salpingectomy Varicella NON-immune DM type 2 A1C 6.9->6.7->5.5 last on 03/19/22 Metformin 5051mBID pre-pregnancy 12/21/21: start 12u NPH at bedtime, continue Metformin 50038mID per BEB   Chief complaint: presented with c/o abdominal pain and urinary burning, has suspicion of partner infidelity. Having white discharge.    S: Resting comfortably. no CTX, no VB.no LOF,  Active fetal movement. Denies: HA, visual changes, SOB, or RUQ/epigastric pain  Maternal Medical History:   Past Medical History:  Diagnosis Date   [place]   Past Surgical History:  Procedure Laterality Date   ECTOPIC PREGNANCY SURGERY Right    TUBAL LIGATION      No Known Allergies  Prior to Admission medications   Medication Sig Start Date End Date Taking? Authorizing Provider  ACCU-CHEK AVIVA PLUS test strip SMARTSIG:Via Meter 02/05/22  Yes [provider]  acetaminophen (TYLENOL) 325 MG tablet Take 650 mg by mouth every 6 (six) hours as needed. Last dose: 6 am.   Yes [provider]  aspirin 81 MG chewable tablet Chew 81 mg by mouth daily. Added on 9/19 by MFM   Yes [provider]  Blood Glucose Monitoring Suppl (ACCU-CHEK AVIVA PLUS) w/Device KIT Use 1 unit as directed once a day as directed E11.9 12/27/21  Yes [provider]  HUMULIN N KWIKPEN 100 UNIT/ML KwikPen Inject 12 Units into the skin at bedtime. 12/21/21  Yes [provider]  metFORMIN (GLUCOPHAGE) 500 MG  tablet Take 500 mg by mouth 2 (two) times daily with a meal.   Yes [provider]  Prenatal Vit-Fe Fumarate-FA (M-NATAL PLUS) 27-1 MG TABS Take 1 tablet by mouth daily. 11/30/21  Yes [provider]  hydrocortisone 2.5 % cream Apply 1 Application topically daily as needed. 08/20/21   [provider]  lidocaine (LIDODERM) 5 % 1 patch daily as needed. 08/20/21   [provider]  loratadine (CLARITIN) 10 MG tablet Take 10 mg by mouth daily as needed for allergies.    [provider]      Social History: She  reports that she has never smoked. She has never used smokeless tobacco. She reports that she does not drink alcohol and does not use drugs.  Family History: family history includes Chronic Renal Failure in her mother; Diabetes in her mother; Hyperlipidemia in her mother; Hypertension in her mother.   Review of Systems: A full review of systems was performed and negative except as noted in the HPI.     O:  BP 123/65 (BP Location: Left Arm)   Pulse (!) 116   Temp 99.1 F (37.3 C) (Oral)   Resp 20   LMP 09/29/2021 (Approximate)   SpO2 99%   Results for orders placed or performed during the hospital encounter of 04/05/22 (from the past 48 hour(s))  Urinalysis, Complete w Microscopic   Collection Time: 04/05/22  2:11 AM  Result Value Ref Range   Color, Urine YELLOW (A) YELLOW   APPearance HAZY (A) CLEAR   Specific Gravity, Urine 1.027 1.005 - 1.030   pH  5.0 5.0 - 8.0   Glucose, UA NEGATIVE NEGATIVE mg/dL   Hgb urine dipstick NEGATIVE NEGATIVE   Bilirubin Urine NEGATIVE NEGATIVE   Ketones, ur 80 (A) NEGATIVE mg/dL   Protein, ur 100 (A) NEGATIVE mg/dL   Nitrite NEGATIVE NEGATIVE   Leukocytes,Ua TRACE (A) NEGATIVE   RBC / HPF 6-10 0 - 5 RBC/hpf   WBC, UA 6-10 0 - 5 WBC/hpf   Bacteria, UA NONE SEEN NONE SEEN   Squamous Epithelial / LPF 0-5 0 - 5   Mucus PRESENT    Ca Oxalate Crys, UA PRESENT   Resp Panel by RT-PCR (Flu A&B, Covid)  Anterior Nasal Swab   Collection Time: 04/05/22  2:38 AM   Specimen: Anterior Nasal Swab  Result Value Ref Range   SARS Coronavirus 2 by RT PCR NEGATIVE NEGATIVE   Influenza A by PCR NEGATIVE NEGATIVE   Influenza B by PCR NEGATIVE NEGATIVE  Wet prep, genital   Collection Time: 04/05/22  3:22 AM  Result Value Ref Range   Yeast Wet Prep HPF POC YEAST WITH PSEUDOHYPHAE (A) NONE SEEN   Trich, Wet Prep NONE SEEN NONE SEEN   Clue Cells Wet Prep HPF POC NONE SEEN NONE SEEN   WBC, Wet Prep HPF POC >=10 (A) <10   Sperm NONE SEEN      Constitutional: NAD, AAOx3  HE/ENT: extraocular movements grossly intact, moist mucous membranes CV: RRR PULM: nl respiratory effort, CTABL     Abd: gravid, non-tender, non-distended, soft      Ext: Non-tender, Nonedematous   Psych: mood appropriate, speech normal Pelvic: SSE done: scant white DC noted, no erythema; cervix closed but friable.     Fetal  monitoring: Cat I Appropriate for GA Baseline: 150 Variability: moderate Accelerations: present x >2 Decelerations absent Time 4mns    A/P: 41y.o. 22w6dere for antenatal surveillance for urinary burning and vaginal discharge with abdominal pain.   Principle Diagnosis:  High risk pregnancy in third trimester  Preterm labor: not present.  Fetal Wellbeing: Reassuring Cat 1 tracing with Reactive NST  pending GC/CT, wet prep + yeast, sent diflucan to pharmacy on file.  UA and resp profile WNL D/c home stable, precautions reviewed, follow-up as scheduled.    ReFrancetta FoundCNM 04/05/2022  4:00 AM

## 2022-04-11 ENCOUNTER — Other Ambulatory Visit: Payer: Self-pay

## 2022-04-11 DIAGNOSIS — E118 Type 2 diabetes mellitus with unspecified complications: Secondary | ICD-10-CM

## 2022-04-11 DIAGNOSIS — O09522 Supervision of elderly multigravida, second trimester: Secondary | ICD-10-CM

## 2022-04-16 ENCOUNTER — Ambulatory Visit: Payer: Medicaid Other

## 2022-04-16 DIAGNOSIS — O09521 Supervision of elderly multigravida, first trimester: Secondary | ICD-10-CM | POA: Insufficient documentation

## 2022-04-16 NOTE — Progress Notes (Deleted)
Pt was a "No Show" for her MFM appt 04/16/22.  No answer when we tried to call her.

## 2022-05-16 ENCOUNTER — Encounter: Payer: Self-pay | Admitting: Infectious Diseases

## 2022-05-16 ENCOUNTER — Ambulatory Visit: Payer: Medicaid Other | Attending: Infectious Diseases | Admitting: Infectious Diseases

## 2022-05-16 VITALS — BP 105/70 | HR 101 | Temp 97.7°F | Ht 60.0 in | Wt 163.0 lb

## 2022-05-16 DIAGNOSIS — Z3A32 32 weeks gestation of pregnancy: Secondary | ICD-10-CM | POA: Insufficient documentation

## 2022-05-16 DIAGNOSIS — M041 Periodic fever syndromes: Secondary | ICD-10-CM | POA: Diagnosis not present

## 2022-05-16 DIAGNOSIS — O09293 Supervision of pregnancy with other poor reproductive or obstetric history, third trimester: Secondary | ICD-10-CM | POA: Insufficient documentation

## 2022-05-16 DIAGNOSIS — R509 Fever, unspecified: Secondary | ICD-10-CM | POA: Insufficient documentation

## 2022-05-16 DIAGNOSIS — O26893 Other specified pregnancy related conditions, third trimester: Secondary | ICD-10-CM | POA: Insufficient documentation

## 2022-05-16 DIAGNOSIS — O24113 Pre-existing diabetes mellitus, type 2, in pregnancy, third trimester: Secondary | ICD-10-CM | POA: Insufficient documentation

## 2022-05-16 NOTE — Progress Notes (Signed)
NAME: Claudia Cannon  DOB: 06-20-1980  MRN: [id number]  Date/Time: 05/16/2022 10:33 AM   Subjective:  Pt interviewed with spanish interpretor Claudia Cannon in the room ?pt referred to me by Rheumatologist Dr. Posey Cannon for Fever  Claudia Cannon is a 41 y.o. female with a history of diabetes, she has a h/o tubal pregnancy and had to undergo Rt salpingectomy April 2022,  this is her 5th pregnancy and she is [redacted] weeks pregnant now. presents with fever which happens once a month for 3 days- It started in April 2023 and had temp of 104, with head ache, body ache and abdominal pain. She presented to the ED on 09/28/21 and group A strep in throat was detected positive and was treated- April was her last period- then in May she ws diagnosed with pregnancy. 10/31/21 she came to the ED with pelvic pain- she presented again in July with pelvic pain, nausea , headache . She was seen in [facility] ED  No fever in the eD. Pelvic ultrasound showed viable pregnancy, treated with fluids and sent home. Then on 01/11/22 presented to [facility] ED with pelvic pain, head ache, N Temp was 98.6, tachycardia. Blood culture negative She was tachycardic- She was discharged as per her request. That tine the fever started a day aftr eating [place] There has never been connection with food anytime before are after  Pt was referred to rheumatologist for positie ANA speckled of 1:360 and the rest of the ANA panel, vasculitis panel, CRP neg Quant Gold neg, serum ferritin neg Rheumatology referred her to me Her last fever was oct 28-Nov 2- she ook tylenol when she had the fever She is originally from [place] and came here > 20 yers- no travle abroad Today she as no symptoms like cough, sore throat, cold, runny nose, diarrhea, abdominal pain, headache, rash, joint pain, fever , oral lesionsetc No family h/o periodic fever Past Medical History:  Diagnosis Date   Diabetes mellitus without complication ([place])     Past Surgical History:   Procedure Laterality Date   ECTOPIC PREGNANCY SURGERY Right    TUBAL LIGATION      Social History   Socioeconomic History   Marital status: Married    Spouse name: Claudia Cannon   Number of children: 4   Years of education: Not on file   Highest education level: Not on file  Occupational History   Not on file  Tobacco Use   Smoking status: Never   Smokeless tobacco: Never  Vaping Use   Vaping Use: Never used  Substance and Sexual Activity   Alcohol use: No   Drug use: No   Sexual activity: Yes  Other Topics Concern   Not on file  Social History Narrative   Not on file   Social Determinants of Health   Financial Resource Strain: Not on file  Food Insecurity: Not on file  Transportation Needs: Not on file  Physical Activity: Not on file  Stress: Not on file  Social Connections: Not on file  Intimate Partner Violence: Not on file    Family History  Problem Relation Age of Onset   Diabetes Mother    Hypertension Mother    Hyperlipidemia Mother    Chronic Renal Failure Mother    No Known Allergies I? Current Outpatient Medications  Medication Sig Dispense Refill   ACCU-CHEK AVIVA PLUS test strip SMARTSIG:Via Meter     acetaminophen (TYLENOL) 325 MG tablet Take 650 mg by mouth every 6 (six) hours as needed.  Last dose: 6 am.     aspirin 81 MG chewable tablet Chew 81 mg by mouth daily. Added on 9/19 by MFM     Blood Glucose Monitoring Suppl (ACCU-CHEK AVIVA PLUS) w/Device KIT Use 1 unit as directed once a day as directed E11.9     HUMULIN N KWIKPEN 100 UNIT/ML KwikPen Inject 12 Units into the skin at bedtime.     hydrocortisone 2.5 % cream Apply 1 Application topically daily as needed.     lidocaine (LIDODERM) 5 % 1 patch daily as needed.     loratadine (CLARITIN) 10 MG tablet Take 10 mg by mouth daily as needed for allergies.     metFORMIN (GLUCOPHAGE) 500 MG tablet Take 500 mg by mouth 2 (two) times daily with a meal.     Prenatal Vit-Fe Fumarate-FA (M-NATAL PLUS) 27-1  MG TABS Take 1 tablet by mouth daily.     No current facility-administered medications for this visit.     Abtx:  Anti-infectives (From admission, onward)    None       REVIEW OF SYSTEMS:  Const: negative fever, negative chills, negative weight loss Eyes: negative diplopia or visual changes, negative Cannon pain ENT: negative coryza, negative sore throat Resp: negative cough, hemoptysis, dyspnea Cards: negative for chest pain, palpitations, lower extremity edema GU: negative for frequency, dysuria and hematuria GI: Negative for abdominal pain, diarrhea, bleeding, constipation Skin: negative for rash and pruritus Heme: negative for easy bruising and gum/nose bleeding MS: negative for myalgias, arthralgias, back pain and muscle weakness Neurolo:negative for headaches, dizziness, vertigo, memory problems  Psych: negative for feelings of anxiety, depression  Endocrine: negative for thyroid, diabetes Allergy/Immunology- negative for any medication or food allergies ?  Objective:  VITALS:  BP 105/70   Pulse (!) 101   Temp 97.7 F (36.5 C) (Oral)   Ht 5' (1.524 m)   Wt 163 lb (73.9 kg)   LMP 09/29/2021 (Approximate)   BMI 31.83 kg/m   PHYSICAL EXAM:  General: Alert, cooperative, no distress, appears stated age.  Head: Normocephalic, without obvious abnormality, atraumatic. Eyes: Conjunctivae clear, anicteric sclerae. Pupils are equal ENT Nares normal. No drainage or sinus tenderness. Lips, mucosa, and tongue normal. No Thrush Neck: Supple, symmetrical, no adenopathy, thyroid: non tender no carotid bruit and no JVD. Back: No CVA tenderness. Lungs: Clear to auscultation bilaterally. No Wheezing or Rhonchi. No rales. Heart: Regular rate and rhythm, no murmur, rub or gallop. Abdomen: Soft, non-tender,not distended. Bowel sounds normal. No masses Extremities: atraumatic, no cyanosis. No edema. No clubbing Skin: No rashes or lesions. Or bruising Lymph: Cervical,  supraclavicular normal. Neurologic: Grossly non-focal Pertinent Labs 04/25/22 ESR 58 CRP 2 Ferritin ANCA/NA panel ASO titer N ANA- 1: 360 Quant Gold  HIV/RPR in May 2023 Neg  ? Impression/Recommendation ?Periodic fever with headache, body ache and abdominal pain in  patient wh is [redacted] weeks pregnant. It started 8 months ago in April and very soon after that she tested positive for pregnancy The fever happens once a month for 2-3 days and high grade of 104. She did not have any fever in Nov 2023.?  Periodic fevers are seldom due to infection. She had Group A strep positive once but neg other times- If this is due to Group A strep URI causing periodic fever her ASO titer should be high- but is normal Malaria does not present monthly Relapsing fever is again not once a month  So her fevers could be  due to autoinflammatory conditions like  Familial mediterranean fever, TRAPS, PFAPA ( which is fever, pharyngitis, adenopathy and aphthous ulcer) Stills disease can cause periodic fever.  There was one time association with [place]- This is unlikely to be Alpha gal syndrome which is caused after lonestar tick bite and allergy to meat.  If she has another fever I asked her to come to my office ASAP and I would examine her fr any associated clinical findings and do the following tests Blood culture Malaria parasite Cmv/EBV RPR- repeat HIV- repeat Strep A test Alpha gal   Meanwhile she can go back to rheumtologist to check for autoinflammatory conditions like TRAPS/CAPS/ FMF/PFAPA Also to check immunoglobulin level , TSH, vitamin D etc Usually NSAID like Naprosyn or steroids is given, but because of pregnancy  contraindicated ? _Diabetes  H/o tubal pregnancy and rt salpingectomy H/o tubal ligation reversal __________________________________________________ Discussed with patient,in great detail Will discuss with Dr.Patel as well  Spent 1 hour in this visit which included counseling  the patient to keep temp log, symptoms log, and contact ASAP if fever recurs Note:  This document was prepared using Dragon voice recognition software and may include unintentional dictation errors.

## 2022-05-16 NOTE — Patient Instructions (Addendum)
You are referred to me for periodic fever that you have every month lasting 3-4 days- last time was in Oct 29-Nov 3 You had some blood work and they have been normal except ANA Please keep a journal and monitor your temp and also note down what you eat- Will see you if you have a fever in my office. 8.30=11.45 Tuesday and Thursday

## 2022-05-21 DIAGNOSIS — O09899 Supervision of other high risk pregnancies, unspecified trimester: Secondary | ICD-10-CM | POA: Insufficient documentation

## 2022-05-27 ENCOUNTER — Other Ambulatory Visit: Payer: Self-pay | Admitting: Obstetrics and Gynecology

## 2022-05-27 DIAGNOSIS — O24913 Unspecified diabetes mellitus in pregnancy, third trimester: Secondary | ICD-10-CM

## 2022-05-28 ENCOUNTER — Ambulatory Visit
Admission: RE | Admit: 2022-05-28 | Discharge: 2022-05-28 | Disposition: A | Discharge status: Home / Self Care | Payer: Medicaid Other | Source: Ambulatory Visit | Attending: Obstetrics and Gynecology | Admitting: Obstetrics and Gynecology

## 2022-05-28 DIAGNOSIS — O24913 Unspecified diabetes mellitus in pregnancy, third trimester: Secondary | ICD-10-CM | POA: Diagnosis present

## 2022-06-10 NOTE — L&D Delivery Note (Signed)
Delivery Note  First Stage: Labor onset: 1724 Augmentation : pitocin, AROM Analgesia /Anesthesia intrapartum: IV fentanyl x 1 AROM at 1724  Second Stage: Complete dilation at 2245 Onset of pushing at 2249 FHR second stage Category II tracing, recurrent variable decelerations with good recovery  Delivery of a viable female infant 07/02/2022 at 2257 by Lucrezia Europe, CNM. delivery of fetal head in OA position with restitution to ROT. No nuchal cord;  Anterior then posterior shoulders delivered easily with gentle downward traction. Baby placed on mom's chest, and attended to by peds.  Cord double clamped after cessation of pulsation, cut by FOB Cord blood sample collected   Third Stage: Placenta delivered Delena Bali intact with 3 VC @ 2302 Placenta disposition: discarded Placenta noted to have infarcts and accessory lobe in the membranes, appears intact Uterine tone firm / bleeding scant  1st degree laceration identified  Anesthesia for repair: n/a Repair n/a, tear hemostatic and approximated Est. Blood Loss (mL): 100FH  Complications: none  Mom to postpartum.  Baby to Couplet care / Skin to Skin.  Newborn: Birth Weight: Tbd, infant skin-to-skin  Apgar Scores: 7, 9 Feeding planned: breastfeeding and bottle feeding

## 2022-06-22 ENCOUNTER — Observation Stay
Admission: EM | Admit: 2022-06-22 | Discharge: 2022-06-23 | Disposition: A | Discharge status: Home / Self Care | Payer: Medicaid Other

## 2022-06-22 ENCOUNTER — Encounter: Payer: Self-pay | Admitting: Obstetrics and Gynecology

## 2022-06-22 ENCOUNTER — Other Ambulatory Visit: Payer: Self-pay

## 2022-06-22 DIAGNOSIS — O26893 Other specified pregnancy related conditions, third trimester: Principal | ICD-10-CM | POA: Insufficient documentation

## 2022-06-22 DIAGNOSIS — O24419 Gestational diabetes mellitus in pregnancy, unspecified control: Secondary | ICD-10-CM | POA: Diagnosis not present

## 2022-06-22 DIAGNOSIS — Z3A38 38 weeks gestation of pregnancy: Secondary | ICD-10-CM | POA: Insufficient documentation

## 2022-06-22 DIAGNOSIS — Z79899 Other long term (current) drug therapy: Secondary | ICD-10-CM | POA: Insufficient documentation

## 2022-06-22 DIAGNOSIS — Z7984 Long term (current) use of oral hypoglycemic drugs: Secondary | ICD-10-CM | POA: Diagnosis not present

## 2022-06-22 DIAGNOSIS — Z7982 Long term (current) use of aspirin: Secondary | ICD-10-CM | POA: Diagnosis not present

## 2022-06-22 DIAGNOSIS — O479 False labor, unspecified: Secondary | ICD-10-CM | POA: Diagnosis present

## 2022-06-22 MED ORDER — CALCIUM CARBONATE ANTACID 500 MG PO CHEW: 2.0000 | CHEWABLE_TABLET | ORAL | Status: DC | PRN

## 2022-06-22 MED ORDER — ACETAMINOPHEN 500 MG PO TABS: 1000.0000 mg | ORAL_TABLET | Freq: Four times a day (QID) | ORAL | Status: DC | PRN

## 2022-06-22 NOTE — OB Triage Note (Signed)
Patient is a G6P4 at 38wks coming in for pelvic pain that radiates to her back that she rates a 4/10. Pain began around 4pm earlier today, and has been ongoing since. Patient reports she has not taken anything for pain. Patient reports +FM, and denies feeling ctx. Patient denies bright red bleeding, headache, or blurred vision. Initial FHT is 125bpm. Ardelle Lesches, CNM completes SVE of 1.5/60/-2.

## 2022-06-23 DIAGNOSIS — O26893 Other specified pregnancy related conditions, third trimester: Secondary | ICD-10-CM | POA: Diagnosis not present

## 2022-06-23 NOTE — Discharge Summary (Signed)
Claudia Cannon is a 42 y.o. female. She is at [redacted]w[redacted]d gestation. Patient's last menstrual period was 09/29/2021 (approximate). 07/06/2022, by Last Menstrual Period, c/w early [place] at [redacted]w[redacted]d.  Prenatal care site: [facility]  Chief complaint: contractions   HPI: Claudia Cannon presents to L&D with complaints of contractions that started around 1600 yesterday.  Pain starts in the front and radiates to her back.  She denies LOF or vaginal bleeding. Endorses good fetal movement.    Factors complicating pregnancy: Preexisting Type II DM AMA Varicella non-immune   S: Resting comfortably, no VB.no LOF,  Active fetal movement.   Maternal Medical History:  Past Medical Hx:  has a past medical history of Diabetes mellitus without complication ([place]).    Past Surgical Hx:  has a past surgical history that includes Ectopic pregnancy surgery (Right) and Tubal ligation.   No Known Allergies   Prior to Admission medications   Medication Sig Start Date End Date Taking? Authorizing Provider  aspirin 81 MG chewable tablet Chew 81 mg by mouth daily. Added on 9/19 by MFM   Yes [provider]  HUMULIN N KWIKPEN 100 UNIT/ML KwikPen Inject 12 Units into the skin at bedtime. 12/21/21  Yes [provider]  metFORMIN (GLUCOPHAGE) 500 MG tablet Take 500 mg by mouth 2 (two) times daily with a meal.   Yes [provider]  Prenatal Vit-Fe Fumarate-FA (M-NATAL PLUS) 27-1 MG TABS Take 1 tablet by mouth daily. 11/30/21  Yes [provider]  ACCU-CHEK AVIVA PLUS test strip SMARTSIG:Via Meter 02/05/22   [provider]  acetaminophen (TYLENOL) 325 MG tablet Take 650 mg by mouth every 6 (six) hours as needed. Last dose: 6 am.    [provider]  Blood Glucose Monitoring Suppl (ACCU-CHEK AVIVA PLUS) w/Device KIT Use 1 unit as directed once a day as directed E11.9 12/27/21   [provider]  hydrocortisone 2.5 % cream Apply 1 Application topically daily  as needed. 08/20/21   [provider]  lidocaine (LIDODERM) 5 % 1 patch daily as needed. Patient not taking: Reported on 06/22/2022 08/20/21   [provider]  loratadine (CLARITIN) 10 MG tablet Take 10 mg by mouth daily as needed for allergies. Patient not taking: Reported on 06/22/2022    [provider]    Social History: She  reports that she has never smoked. She has never used smokeless tobacco. She reports that she does not drink alcohol and does not use drugs.  Family History: family history includes Chronic Renal Failure in her mother; Diabetes in her mother; Hyperlipidemia in her mother; Hypertension in her mother. ,  Review of Systems: A full review of systems was performed and negative except as noted in the HPI.     Pertinent Results:  Prenatal Labs: Blood type/Rh O pos  Antibody screen Negative    Rubella Immune    Varicella Equivocal   RPR NR    HBsAg NR   Hep C NR   HIV NR    GC neg  Chlamydia neg  Genetic screening cfDNA negative   1 hour GTT N/A - Type II DM  3 hour GTT N/A  GBS Neg      O:  BP 123/77 (BP Location: Left Arm)   Pulse 76   Temp 97.6 F (36.4 C) (Oral)   Resp 16   LMP 09/29/2021 (Approximate)  No results found for this or any previous visit (from the past 48 hour(s)).   Constitutional: NAD, AAOx3  HE/ENT: extraocular movements grossly intact, moist mucous membranes CV: RRR PULM: nl respiratory effort Abd: gravid, non-tender, non-distended, soft  Ext: Non-tender, Nonedmeatous Psych: mood appropriate, speech normal SVE: Dilation: 2 Effacement (%): 60 Station: -3 Presentation: Vertex Exam by:: Ardelle Lesches CNM  Initial exam 1-2/60/-2  NST: Baseline FHR: 125 beats/min Variability: moderate Accelerations: present Decelerations: absent Tocometry: Infrequent, mild contractions   Interpretation:  INDICATIONS: diabetes mellitus and rule out uterine contractions RESULTS:  A NST procedure was performed with FHR  monitoring and a normal baseline established, appropriate time of 20-40 minutes of evaluation, and accels >2 seen w 15x15 characteristics.  Results show a REACTIVE NST.   Assessment: 42 y.o. [id number] [redacted]w[redacted]d 07/06/2022, Date entered prior to episode creation   Principle diagnosis: Uterine contractions during pregnancy [O47.9]   Plan: 1) Reactive NST  -Category 1 tracing  -Reassuring fetal status   2) Labor: Not present  -Minimal to no cervical change over 8 hours and contractions infrequent prior to discharge -Discussed warning signs to return to L&D triage with  -Reviewed comfort measures   3) Disposition: discharge home stable -Precautions reviewed  -Follow up as scheduled this week with [place]   ----- Drinda Butts, CNM Certified Nurse Midwife [facility]

## 2022-06-23 NOTE — Progress Notes (Signed)
Discharge instructions provided to patient. Patient verbalized understanding. Pt educated on signs and symptoms of labor, vaginal bleeding, LOF, and fetal movement. Red flag signs reviewed by RN. Patient discharged home with her daughter in stable condition.

## 2022-07-02 ENCOUNTER — Encounter: Payer: Self-pay | Admitting: Obstetrics and Gynecology

## 2022-07-02 ENCOUNTER — Inpatient Hospital Stay
Admission: RE | Admit: 2022-07-02 | Discharge: 2022-07-05 | DRG: 806 | Disposition: A | Discharge status: Home / Self Care | Payer: Medicaid Other | Source: Ambulatory Visit | Attending: Obstetrics | Admitting: Obstetrics

## 2022-07-02 ENCOUNTER — Other Ambulatory Visit: Payer: Self-pay

## 2022-07-02 DIAGNOSIS — O864 Pyrexia of unknown origin following delivery: Secondary | ICD-10-CM | POA: Diagnosis not present

## 2022-07-02 DIAGNOSIS — Z3490 Encounter for supervision of normal pregnancy, unspecified, unspecified trimester: Secondary | ICD-10-CM | POA: Diagnosis present

## 2022-07-02 DIAGNOSIS — Z3A39 39 weeks gestation of pregnancy: Secondary | ICD-10-CM | POA: Diagnosis not present

## 2022-07-02 DIAGNOSIS — D62 Acute posthemorrhagic anemia: Secondary | ICD-10-CM | POA: Diagnosis not present

## 2022-07-02 DIAGNOSIS — Z20822 Contact with and (suspected) exposure to covid-19: Secondary | ICD-10-CM | POA: Diagnosis present

## 2022-07-02 DIAGNOSIS — Z7982 Long term (current) use of aspirin: Secondary | ICD-10-CM | POA: Diagnosis not present

## 2022-07-02 DIAGNOSIS — E119 Type 2 diabetes mellitus without complications: Secondary | ICD-10-CM | POA: Diagnosis present

## 2022-07-02 DIAGNOSIS — O9902 Anemia complicating childbirth: Secondary | ICD-10-CM | POA: Diagnosis present

## 2022-07-02 DIAGNOSIS — Z7984 Long term (current) use of oral hypoglycemic drugs: Secondary | ICD-10-CM

## 2022-07-02 DIAGNOSIS — O2412 Pre-existing diabetes mellitus, type 2, in childbirth: Secondary | ICD-10-CM | POA: Diagnosis present

## 2022-07-02 DIAGNOSIS — Z794 Long term (current) use of insulin: Secondary | ICD-10-CM

## 2022-07-02 LAB — GLUCOSE, CAPILLARY
Glucose-Capillary: 87 mg/dL (ref 70–99)
Glucose-Capillary: 79 mg/dL (ref 70–99)
Glucose-Capillary: 86 mg/dL (ref 70–99)

## 2022-07-02 LAB — CBC
HCT: 36.8 % (ref 36.0–46.0)
Hemoglobin: 12.5 g/dL (ref 12.0–15.0)
MCH: 29.1 pg (ref 26.0–34.0)
MCHC: 34 g/dL (ref 30.0–36.0)
MCV: 85.6 fL (ref 80.0–100.0)
Platelets: 289 10*3/uL (ref 150–400)
RBC: 4.3 MIL/uL (ref 3.87–5.11)
RDW: 13.6 % (ref 11.5–15.5)
WBC: 9.1 10*3/uL (ref 4.0–10.5)
nRBC: 0 % (ref 0.0–0.2)

## 2022-07-02 LAB — ABO/RH: ABO/RH(D): O POS

## 2022-07-02 LAB — TYPE AND SCREEN
ABO/RH(D): O POS
Antibody Screen: NEGATIVE

## 2022-07-02 MED ORDER — BENZOCAINE-MENTHOL 20-0.5 % EX AERO
1.0000 | INHALATION_SPRAY | CUTANEOUS | Status: DC | PRN
  Filled 2022-07-02 (×2): qty 56

## 2022-07-02 MED ORDER — LACTATED RINGERS IV SOLN
500.0000 mL | INTRAVENOUS | Status: DC | PRN
  Administered 2022-07-02 (×2): 500 mL via INTRAVENOUS

## 2022-07-02 MED ORDER — TERBUTALINE SULFATE 1 MG/ML IJ SOLN
0.2500 mg | Freq: Once | INTRAMUSCULAR | Status: AC | PRN
  Administered 2022-07-02: 0.25 mg via SUBCUTANEOUS
  Filled 2022-07-02: qty 1

## 2022-07-02 MED ORDER — OXYTOCIN BOLUS FROM INFUSION: 333.0000 mL | Freq: Once | INTRAVENOUS | Status: DC

## 2022-07-02 MED ORDER — OXYTOCIN-SODIUM CHLORIDE 30-0.9 UT/500ML-% IV SOLN: 2.5000 [IU]/h | INTRAVENOUS | Status: DC

## 2022-07-02 MED ORDER — ACETAMINOPHEN 325 MG PO TABS
650.0000 mg | ORAL_TABLET | ORAL | Status: DC | PRN
  Administered 2022-07-04 (×2): 650 mg via ORAL
  Filled 2022-07-02 (×4): qty 2

## 2022-07-02 MED ORDER — OXYTOCIN-SODIUM CHLORIDE 30-0.9 UT/500ML-% IV SOLN
1.0000 m[IU]/min | INTRAVENOUS | Status: DC
  Administered 2022-07-02 (×2): 2 m[IU]/min via INTRAVENOUS
  Filled 2022-07-02: qty 500

## 2022-07-02 MED ORDER — ONDANSETRON HCL 4 MG PO TABS: 4.0000 mg | ORAL_TABLET | ORAL | Status: DC | PRN

## 2022-07-02 MED ORDER — PRENATAL MULTIVITAMIN CH
1.0000 | ORAL_TABLET | Freq: Every day | ORAL | Status: DC
  Administered 2022-07-03 – 2022-07-05 (×3): 1 via ORAL
  Filled 2022-07-02 (×3): qty 1

## 2022-07-02 MED ORDER — SIMETHICONE 80 MG PO CHEW: 80.0000 mg | CHEWABLE_TABLET | ORAL | Status: DC | PRN

## 2022-07-02 MED ORDER — LIDOCAINE HCL (PF) 1 % IJ SOLN
INTRAMUSCULAR | Status: AC
  Filled 2022-07-02: qty 30

## 2022-07-02 MED ORDER — LIDOCAINE HCL (PF) 1 % IJ SOLN: 30.0000 mL | INTRAMUSCULAR | Status: DC | PRN

## 2022-07-02 MED ORDER — OXYTOCIN 10 UNIT/ML IJ SOLN
INTRAMUSCULAR | Status: AC
  Filled 2022-07-02: qty 2

## 2022-07-02 MED ORDER — TETANUS-DIPHTH-ACELL PERTUSSIS 5-2.5-18.5 LF-MCG/0.5 IM SUSY
0.5000 mL | PREFILLED_SYRINGE | Freq: Once | INTRAMUSCULAR | Status: DC
  Filled 2022-07-02: qty 0.5

## 2022-07-02 MED ORDER — ZOLPIDEM TARTRATE 5 MG PO TABS: 5.0000 mg | ORAL_TABLET | Freq: Every evening | ORAL | Status: DC | PRN

## 2022-07-02 MED ORDER — ONDANSETRON HCL 4 MG/2ML IJ SOLN: 4.0000 mg | Freq: Four times a day (QID) | INTRAMUSCULAR | Status: DC | PRN

## 2022-07-02 MED ORDER — DIBUCAINE (PERIANAL) 1 % EX OINT
1.0000 | TOPICAL_OINTMENT | CUTANEOUS | Status: DC | PRN
  Filled 2022-07-02: qty 28

## 2022-07-02 MED ORDER — ONDANSETRON HCL 4 MG/2ML IJ SOLN: 4.0000 mg | INTRAMUSCULAR | Status: DC | PRN

## 2022-07-02 MED ORDER — COCONUT OIL OIL: 1.0000 | TOPICAL_OIL | Status: DC | PRN

## 2022-07-02 MED ORDER — LACTATED RINGERS IV SOLN: INTRAVENOUS | Status: DC

## 2022-07-02 MED ORDER — WITCH HAZEL-GLYCERIN EX PADS
1.0000 | MEDICATED_PAD | CUTANEOUS | Status: DC | PRN
  Filled 2022-07-02: qty 100

## 2022-07-02 MED ORDER — FENTANYL CITRATE (PF) 100 MCG/2ML IJ SOLN
50.0000 ug | INTRAMUSCULAR | Status: DC | PRN
  Administered 2022-07-02: 50 ug via INTRAVENOUS
  Filled 2022-07-02 (×2): qty 2

## 2022-07-02 MED ORDER — SOD CITRATE-CITRIC ACID 500-334 MG/5ML PO SOLN: 30.0000 mL | ORAL | Status: DC | PRN

## 2022-07-02 MED ORDER — ACETAMINOPHEN 325 MG PO TABS
650.0000 mg | ORAL_TABLET | ORAL | Status: DC | PRN
  Administered 2022-07-03: 650 mg via ORAL

## 2022-07-02 MED ORDER — AMMONIA AROMATIC IN INHA
RESPIRATORY_TRACT | Status: AC
  Filled 2022-07-02: qty 10

## 2022-07-02 MED ORDER — MISOPROSTOL 200 MCG PO TABS
ORAL_TABLET | ORAL | Status: AC
  Filled 2022-07-02: qty 4

## 2022-07-02 MED ORDER — DIPHENHYDRAMINE HCL 25 MG PO CAPS: 25.0000 mg | ORAL_CAPSULE | Freq: Four times a day (QID) | ORAL | Status: DC | PRN

## 2022-07-02 MED ORDER — METFORMIN HCL 500 MG PO TABS
500.0000 mg | ORAL_TABLET | Freq: Two times a day (BID) | ORAL | Status: DC
  Administered 2022-07-03 – 2022-07-05 (×6): 500 mg via ORAL
  Filled 2022-07-02 (×7): qty 1

## 2022-07-02 MED ORDER — IBUPROFEN 600 MG PO TABS
600.0000 mg | ORAL_TABLET | Freq: Four times a day (QID) | ORAL | Status: DC
  Administered 2022-07-03 – 2022-07-05 (×10): 600 mg via ORAL
  Filled 2022-07-02 (×10): qty 1

## 2022-07-02 MED ORDER — SENNOSIDES-DOCUSATE SODIUM 8.6-50 MG PO TABS
2.0000 | ORAL_TABLET | Freq: Every day | ORAL | Status: DC
  Administered 2022-07-03 – 2022-07-05 (×3): 2 via ORAL
  Filled 2022-07-02 (×3): qty 2

## 2022-07-02 NOTE — Progress Notes (Signed)
Labor Progress Note  Ashantae Pangallo is a 42 y.o. K2H0623 at [redacted]w[redacted]d by ultrasound admitted for induction of labor due to Diabetes.  Subjective: she feels comfortable, is only noticing occasional contractions  Objective: BP 125/86 (BP Location: Left Arm)   Pulse 90   Temp 97.7 F (36.5 C) (Oral)   Resp 16   Ht 5' (1.524 m)   Wt 75.8 kg   LMP 09/29/2021 (Approximate)   BMI 32.61 kg/m  Notable VS details: reviewed  Fetal Assessment: FHT:  FHR: 155 bpm, variability: moderate,  accelerations:  Present,  decelerations:  Present recurrent late decelerations Category/reactivity:  Category II UC:   regular, every 8-10 minutes SVE:    Dilation: 3cm  Effacement: 50%  Station:  -3  Consistency: soft  Position: middle  Membrane status:intact Amniotic color: n/a  Labs: Lab Results  Component Value Date   WBC 9.1 07/02/2022   HGB 12.5 07/02/2022   HCT 36.8 07/02/2022   MCV 85.6 07/02/2022   PLT 289 07/02/2022    Assessment / Plan: 42 year old [id number] at [redacted]w[redacted]d with IOL for T2DM  Labor:  Pitocin was at 27mu/min and she began having recurrent late decelerations every 8-10 minutes after 3 minute long contractions. Position changes did not resolve the late decelerations so pitocin turned off. Attempted to AROM and place internal monitors, but unable to successfully AROM d/t fetus ballottable. Able to trace FHT externally. Will leave pitocin off until Category I tracing then restart pitocin.  Preeclampsia:  no signs or symptoms of toxicity Fetal Wellbeing:  Category II, IV bolus administered, encouraged to position on sides and not on back, pitocin turned off Pain Control:   will request IV pain medications I/D:   GBS negative Anticipated MOD:  NSVD  Gertie Fey, CNM 07/02/2022, 3:35 PM

## 2022-07-02 NOTE — H&P (Addendum)
OB History & Physical   History of Present Illness:  Chief Complaint:   HPI:  Claudia Cannon is a 42 y.o. [phone] female at [redacted]w[redacted]d dated by [place].  She presents to L&D for scheduled IOL for T2DM on insulin  Active FM Occasional contractions  Pregnancy Issues: 1. AMA 2. Varicella non-immune 3. T2DM, taking insulin and Metformin 4. Fever of unknown origin 5. Infertility post-tubal ligation and tubal reastomosis    Maternal Medical History:   Past Medical History:  Diagnosis Date   Diabetes mellitus without complication ([place])     Past Surgical History:  Procedure Laterality Date   ECTOPIC PREGNANCY SURGERY Right    TUBAL LIGATION      No Known Allergies  Prior to Admission medications   Medication Sig Start Date End Date Taking? Authorizing Provider  ACCU-CHEK AVIVA PLUS test strip SMARTSIG:Via Meter 02/05/22  Yes [provider]  aspirin 81 MG chewable tablet Chew 81 mg by mouth daily. Added on 9/19 by MFM   Yes [provider]  Blood Glucose Monitoring Suppl (ACCU-CHEK AVIVA PLUS) w/Device KIT Use 1 unit as directed once a day as directed E11.9 12/27/21  Yes [provider]  HUMULIN N KWIKPEN 100 UNIT/ML KwikPen Inject 12 Units into the skin at bedtime. 12/21/21  Yes [provider]  metFORMIN (GLUCOPHAGE) 500 MG tablet Take 500 mg by mouth 2 (two) times daily with a meal.   Yes [provider]  Prenatal Vit-Fe Fumarate-FA (M-NATAL PLUS) 27-1 MG TABS Take 1 tablet by mouth daily. 11/30/21  Yes [provider]  acetaminophen (TYLENOL) 325 MG tablet Take 650 mg by mouth every 6 (six) hours as needed. Last dose: 6 am.    [provider]  hydrocortisone 2.5 % cream Apply 1 Application topically daily as needed. 08/20/21   [provider]  lidocaine (LIDODERM) 5 % 1 patch daily as needed. Patient not taking: Reported on 06/22/2022 08/20/21   [provider]  loratadine (CLARITIN) 10 MG tablet Take  10 mg by mouth daily as needed for allergies. Patient not taking: Reported on 06/22/2022    [provider]   Prenatal care site: [place] History: She  reports that she has never smoked. She has never used smokeless tobacco. She reports that she does not drink alcohol and does not use drugs.  Family History: family history includes Chronic Renal Failure in her mother; Diabetes in her mother; Hyperlipidemia in her mother; Hypertension in her mother.   Review of Systems: A full review of systems was performed and negative except as noted in the HPI.     Physical Exam:  Vital Signs: BP 125/86 (BP Location: Left Arm)   Pulse 90   Temp 97.7 F (36.5 C) (Oral)   Resp 16   Ht 5' (1.524 m)   Wt 75.8 kg   LMP 09/29/2021 (Approximate)   BMI 32.61 kg/m  General: no acute distress.  HEENT: normocephalic, atraumatic Heart: regular rate & rhythm.  No murmurs/rubs/gallops Lungs: clear to auscultation bilaterally, normal respiratory effort Abdomen: soft, gravid, non-tender;  EFW: 7.5lb Pelvic:   External: Normal external female genitalia  Cervix: Dilation: 3 /   /      Extremities: non-tender, symmetric, mild edema bilaterally.  DTRs: +2  Neurologic: Alert & oriented x 3.    Results for orders placed or performed during the hospital encounter of 07/02/22 (from the past 24 hour(s))  Glucose, capillary     Status: None  Collection Time: 07/02/22 12:53 PM  Result Value Ref Range   Glucose-Capillary 87 70 - 99 mg/dL  CBC     Status: None   Collection Time: 07/02/22  1:00 PM  Result Value Ref Range   WBC 9.1 4.0 - 10.5 K/uL   RBC 4.30 3.87 - 5.11 MIL/uL   Hemoglobin 12.5 12.0 - 15.0 g/dL   HCT 36.8 36.0 - 46.0 %   MCV 85.6 80.0 - 100.0 fL   MCH 29.1 26.0 - 34.0 pg   MCHC 34.0 30.0 - 36.0 g/dL   RDW 13.6 11.5 - 15.5 %   Platelets 289 150 - 400 K/uL   nRBC 0.0 0.0 - 0.2 %  Type and screen [place]     Status: None   Collection  Time: 07/02/22  1:05 PM  Result Value Ref Range   ABO/RH(D) O POS    Antibody Screen NEG    Sample Expiration      07/05/2022,2359 Performed at [facility], [address] [place], [place] [address]   ABO/Rh     Status: None   Collection Time: 07/02/22  1:49 PM  Result Value Ref Range   ABO/RH(D)      O POS Performed at [facility], [address] [place], [place] [address]     Pertinent Results:  Prenatal Labs: Blood type/Rh O pos  Antibody screen neg  Rubella Immune  Varicella Equivocal  RPR NR  HBsAg Neg  HIV NR  GC neg  Chlamydia neg  Genetic screening negative  1 hour GTT N/a - T2DM  3 hour GTT N/a   GBS Negative   FHT: 140bpm, moderate variability, accelerations present, no decelerations TOCO: occasional 3 minute long contractions, mild to palpation SVE:  Dilation: 3 /   /      Cephalic by leopolds  No results found.  Assessment:  Claudia Cannon is a 42 y.o. [phone] female at [redacted]w[redacted]d with IOL for T2DM.   Plan:  1. Admit to Labor & Delivery; consents reviewed and obtained  2. Fetal Well being  - Fetal Tracing: Category I tracing - Group B Streptococcus ppx indicated: n/a, GBS negative - Presentation: vertex confirmed by SVE   3. Routine OB: - Prenatal labs reviewed, as above - Rh positive - CBC, T&S, RPR on admit - Clear fluids, IVF  4. Induction of Labor -  Contractions occasional, external toco in place -  Plan for induction with pitocin and aROM -  Plan for continuous fetal monitoring  -  Maternal pain control as desired; requesting IVPM - Anticipate vaginal delivery  5. Post Partum Planning: - Infant feeding: breastfeeding - Contraception: OCPs  Gertie Fey, [place] 07/02/22 3:28 PM

## 2022-07-02 NOTE — Progress Notes (Signed)
Labor Progress Note  Claudia Cannon is a 43 y.o. [id number] at [redacted]w[redacted]d by ultrasound admitted for induction of labor due to T2DM.  Subjective: She feels intermittent rectal pressure with contractions  Objective: BP 110/67 (BP Location: Right Arm)   Pulse 82   Temp (!) 97.5 F (36.4 C) (Oral)   Resp 20   Ht 5' (1.524 m)   Wt 75.8 kg   LMP 09/29/2021 (Approximate)   BMI 32.61 kg/m  Notable VS details: reviewed  Fetal Assessment: FHT:  FHR: 135 bpm, variability: moderate,  accelerations:  Present,  decelerations:  Present recurrent late decelerations and prolonged decelerations x 4 min Category/reactivity:  Category II UC:   regular, every 2-4 minutes SVE:    Dilation: 8cm  Effacement: 90%  Station:  -1  Consistency: soft  Position: middle  Membrane status:AROM @ 1724 Amniotic color: clear  Labs: Lab Results  Component Value Date   WBC 9.1 07/02/2022   HGB 12.5 07/02/2022   HCT 36.8 07/02/2022   MCV 85.6 07/02/2022   PLT 289 07/02/2022    Assessment / Plan: Induction of labor due to T2DM,  progressing well on pitocin  Labor:  Pitocin turned off, good labor progress. Discussed fetal heart rate decelerations with Dr. Ouida Cannon. Fetal heart rate decelerations resolved with position changes, IV fluid bolus, pitocin turned off, and terbutaline given.  Preeclampsia:  no signs or symptoms of toxicity Fetal Wellbeing:  Category II Pain Control:  IV pain meds I/D:   GBS negative Anticipated MOD:  NSVD  Claudia Cannon, CNM 07/02/2022, 9:14 PM

## 2022-07-02 NOTE — Discharge Summary (Addendum)
Obstetrical Discharge Summary  Patient Name: Claudia Cannon DOB: Mar 04, 1981 MRN: [id number]  Date of Admission: 07/02/2022 Date of Delivery: 07/02/22 Delivered by: Donato Schultz, CNM Date of Discharge: 07/05/22  Primary OB: [facility] [id number] last menstrual period was 09/29/2021 (approximate). EDC Estimated Date of Delivery: 07/06/22 Gestational Age at Delivery: [redacted]w[redacted]d   Antepartum complications:  1. AMA 2. Varicella non-immune 3. T2DM, taking insulin and Metformin 4. Fever of unknown origin 5. Infertility post-tubal ligation and tubal reastomosis  Admitting Diagnosis: IOL for T2DM Secondary Diagnosis: NSVD Patient Active Problem List   Diagnosis Date Noted   Postpartum fever 07/04/2022   Encounter for induction of labor 07/02/2022   NSVD (normal spontaneous vaginal delivery) 07/02/2022   Uterine contractions during pregnancy 06/22/2022   Susceptible to varicella (non-immune), currently pregnant 05/21/2022   AMA (advanced maternal age) multigravida 35+, first trimester 04/16/2022   Vaginal discharge during pregnancy in second trimester 04/05/2022   Pre-existing type 2 diabetes mellitus during pregnancy, antepartum 03/19/2022   Sepsis (HCC)    SIRS (systemic inflammatory response syndrome) (HCC) 01/11/2022   Type 2 diabetes mellitus with complication, with long-term current use of insulin (HCC) 01/11/2022   Abdominal pain 01/11/2022   Pregnancy 11/13/2021   Supervision of high-risk pregnancy of elderly multigravida, third trimester 11/13/2021   Hx of ectopic pregnancy 10/26/2020   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 She progressed to 10/100/+2 and pushed 8 minutes, delivering viable female infant over 1st degree laceration. Apgars 7/9. Date of Delivery: 07/02/22 Delivered By: Donato Schultz, CNM Delivery Type: spontaneous vaginal delivery Anesthesia: IV fentanyl x1 Placenta: spontaneous Laceration: 1st degree, hemostatic and approximated, no repair Episiotomy: none Newborn Data: Live born female "De Nurse" Birth Weight:  3660 g APGAR: 7, 9  Newborn Delivery   Birth date/time: 07/02/2022 22:57:00 Delivery type: Vaginal, Spontaneous     Postpartum Procedures: IV labs, IV antibiotics, consults- please see notes [place]:     07/03/2022    8:30 AM  Inocente Salles Postnatal Depression Scale Screening Tool  I have been able to laugh and see the funny side of things. 0  I have looked forward with enjoyment to things. 0  I have blamed myself unnecessarily when things went wrong. 0  I have been anxious or worried for no good reason. 0  I have felt scared or panicky for no good reason. 0  Things have been getting on top of me. 3  I have been so unhappy that I have had difficulty sleeping. 0  I have felt sad or miserable. 0  I have been so unhappy that I have been crying. 0  The thought of harming myself has occurred to me. 0  [place] Postnatal Depression Scale Total 3     Post partum course:  Patient had an complicated postpartum course by elevated WBC count and fever of unknown origin.  By time of discharge on PPD#3, her pain was controlled on oral pain medications; she had appropriate lochia and was ambulating, voiding without difficulty and tolerating regular diet.  She was deemed stable for discharge to home.    Discharge Physical Exam:  BP 93/61 (BP Location: Left Arm)   Pulse (!) 105 Comment: nurse Jasmine notified  Temp 97.9 F (36.6 C) (  Oral)   Resp (!) 34   Ht 5' (1.524 m)   Wt 75.8 kg   LMP 09/29/2021 (Approximate)   SpO2 98% Comment: Room Air  Breastfeeding Unknown   BMI 32.61  kg/m   General: NAD CV: RRR Pulm: CTABL, nl effort ABD: s/nd/nt, fundus firm and below the umbilicus Lochia: moderate Perineum: well approximated/intact DVT Evaluation: LE non-ttp, no evidence of DVT on exam.  Hemoglobin  Date Value Ref Range Status  07/05/2022 9.7 (L) 12.0 - 15.0 g/dL Final   HCT  Date Value Ref Range Status  07/05/2022 28.2 (L) 36.0 - 46.0 % Final    Disposition: stable, discharge to home. Baby Feeding: breastmilk and formula Baby Disposition: home with mom  Rh Immune globulin given: n/a, O pos Rubella vaccine given: immune Varicella vaccine given: immune Tdap vaccine given in AP or PP setting: received 11/23 Flu vaccine given in AP or PP setting: declined  Contraception: OCPs  Prenatal Labs:  Blood type/Rh O pos  Antibody screen neg  Rubella Immune  Varicella Equivocal  RPR NR  HBsAg Neg  HIV NR  GC neg  Chlamydia neg  Genetic screening negative  1 hour GTT N/a - T2DM  3 hour GTT N/a   GBS Negative     Plan:  Helma Argyle was discharged to home in good condition. Follow-up appointment with delivering provider in 6 weeks.  Discharge Medications: Allergies as of 07/05/2022   No Known Allergies      Medication List     STOP taking these medications    Accu-Chek Aviva Plus test strip Generic drug: glucose blood   Accu-Chek Aviva Plus w/Device Kit   aspirin 81 MG chewable tablet   HumuLIN N KwikPen 100 UNIT/ML Kiwkpen Generic drug: Insulin NPH (Human) ([place])   lidocaine 5 % Commonly known as: LIDODERM   loratadine 10 MG tablet Commonly known as: CLARITIN       TAKE these medications    acetaminophen 325 MG tablet Commonly known as: Tylenol Take 2 tablets (650 mg total) by mouth every 4 (four) hours as needed (for pain scale < 4). What changed:  when to take this reasons to take this additional instructions   amoxicillin-clavulanate 875-125 MG tablet Commonly known as: AUGMENTIN Take 1 tablet  by mouth 2 (two) times daily for 7 days.   benzocaine-Menthol 20-0.5 % Aero Commonly known as: DERMOPLAST Apply 1 Application topically as needed for irritation (perineal discomfort).   coconut oil Oil Apply 1 Application topically as needed.   dibucaine 1 % Oint Commonly known as: NUPERCAINAL Place 1 Application rectally as needed for hemorrhoids.   doxycycline 100 MG capsule Commonly known as: VIBRAMYCIN Take 1 capsule (100 mg total) by mouth 2 (two) times daily for 7 days.   ferrous sulfate 325 (65 FE) MG tablet Take 1 tablet (325 mg total) by mouth 2 (two) times daily with a meal.   hydrocortisone 2.5 % cream Apply 1 Application topically daily as needed.   M-Natal Plus 27-1 MG Tabs Take 1 tablet by mouth daily.   metFORMIN 500 MG tablet Commonly known as: GLUCOPHAGE Take 1 tablet (500 mg total) by mouth 2 (two) times daily with a meal.   senna-docusate 8.6-50 MG tablet Commonly known as: Senokot-S Take 2 tablets by mouth daily. Start taking on: July 06, 2022   simethicone 80 MG chewable tablet Commonly known as: MYLICON Chew 1 tablet (80 mg total) by mouth as needed for flatulence.   witch hazel-glycerin pad Commonly  known as: TUCKS Apply 1 Application topically as needed for hemorrhoids.         Follow-up Information     Gertie Fey, CNM Follow up in 3 day(s).   Specialty: Certified Nurse Midwife Why: f/u for infection/labs @ 1000 for lab, 1020 appointment Contact information: [place] [address] [phone]         Gertie Fey, CNM Follow up in 6 week(s).   Specialty: Certified Nurse Midwife Why: PP visit Contact information: [place] [address] [phone]                 Signed:  Ed Blalock, [place] 07/05/2022 4:24 PM

## 2022-07-02 NOTE — Progress Notes (Signed)
Patient ID: Claudia Cannon, female   DOB: Jul 06, 1980, 42 y.o.   MRN: [id number] IOL   1 hr ago with 3 min brady ---> resolved . CNM unable to rupture membranes .   AROM by me blood tinged . FSE placed  Reassuring monitoring currently

## 2022-07-02 NOTE — Progress Notes (Signed)
Labor Progress Note  Claudia Cannon is a 42 y.o. [id number] at [redacted]w[redacted]d by ultrasound admitted for induction of labor due to Diabetes.  Subjective: she reports her contractions are stronger, requesting IV pain medication  Objective: BP 110/67 (BP Location: Right Arm)   Pulse 82   Temp (!) 97.5 F (36.4 C) (Oral)   Resp 20   Ht 5' (1.524 m)   Wt 75.8 kg   LMP 09/29/2021 (Approximate)   BMI 32.61 kg/m  Notable VS details: reviewed  Fetal Assessment: FHT:  FHR: 135 bpm, variability: moderate,  accelerations:  Present,  decelerations:  Present intermittent late and early decelerations Category/reactivity:  Category II UC:   regular, every 1.5-4 minutes SVE:    Dilation: 6cm  Effacement: 90%  Station:  -1  Consistency: soft  Position: middle  Membrane status:AROM @ 1724 Amniotic color: clear  Labs: Lab Results  Component Value Date   WBC 9.1 07/02/2022   HGB 12.5 07/02/2022   HCT 36.8 07/02/2022   MCV 85.6 07/02/2022   PLT 289 07/02/2022    Assessment / Plan: Induction of labor due to T2DM,  progressing well on pitocin  Labor:  Pitocin currently at 51mu/min, good labor progress Preeclampsia:  no signs or symptoms of toxicity Fetal Wellbeing:  Category II, relieved with repositioning Pain Control:  IV pain meds I/D:   GBS negative Anticipated MOD:  NSVD  Gertie Fey, CNM 07/02/2022, 8:00 PM

## 2022-07-03 ENCOUNTER — Encounter: Payer: Self-pay | Admitting: Obstetrics and Gynecology

## 2022-07-03 LAB — CBC
HCT: 30.3 % — ABNORMAL LOW (ref 36.0–46.0)
Hemoglobin: 10.3 g/dL — ABNORMAL LOW (ref 12.0–15.0)
MCH: 29.6 pg (ref 26.0–34.0)
MCHC: 34 g/dL (ref 30.0–36.0)
MCV: 87.1 fL (ref 80.0–100.0)
Platelets: 228 10*3/uL (ref 150–400)
RBC: 3.48 MIL/uL — ABNORMAL LOW (ref 3.87–5.11)
RDW: 13.7 % (ref 11.5–15.5)
WBC: 14.7 10*3/uL — ABNORMAL HIGH (ref 4.0–10.5)
nRBC: 0 % (ref 0.0–0.2)

## 2022-07-03 LAB — GLUCOSE, CAPILLARY
Glucose-Capillary: 86 mg/dL (ref 70–99)
Glucose-Capillary: 109 mg/dL — ABNORMAL HIGH (ref 70–99)
Glucose-Capillary: 160 mg/dL — ABNORMAL HIGH (ref 70–99)
Glucose-Capillary: 101 mg/dL — ABNORMAL HIGH (ref 70–99)

## 2022-07-03 MED ORDER — FERROUS SULFATE 325 (65 FE) MG PO TABS
325.0000 mg | ORAL_TABLET | Freq: Two times a day (BID) | ORAL | Status: DC
  Administered 2022-07-03 – 2022-07-05 (×5): 325 mg via ORAL
  Filled 2022-07-03 (×5): qty 1

## 2022-07-03 NOTE — Progress Notes (Signed)
Post Partum Day 1  Subjective: no complaints  Doing well, no concerns. Ambulating without difficulty, pain managed with PO meds, tolerating regular diet, and voiding without difficulty.   No fever/chills, chest pain, shortness of breath, nausea/vomiting, or leg pain. No nipple or breast pain. No headache, visual changes, or RUQ/epigastric pain.  Objective: BP 111/73 (BP Location: Left Arm)   Pulse 90   Temp 97.7 F (36.5 C) (Oral)   Resp 16   Ht 5' (1.524 m)   Wt 75.8 kg   LMP 09/29/2021 (Approximate)   SpO2 99%   Breastfeeding Unknown   BMI 32.61 kg/m    Physical Exam:  General: alert and cooperative Breasts: soft/nontender CV: RRR Pulm: nl effort Abdomen: soft, non-tender Uterine Fundus: firm Incision: n/a Perineum: minimal edema, repair well approximated Lochia: appropriate DVT Evaluation: No evidence of DVT seen on physical exam. Edinburgh:     07/03/2022    8:30 AM  Flavia Shipper Postnatal Depression Scale Screening Tool  I have been able to laugh and see the funny side of things. 0  I have looked forward with enjoyment to things. 0  I have blamed myself unnecessarily when things went wrong. 0  I have been anxious or worried for no good reason. 0  I have felt scared or panicky for no good reason. 0  Things have been getting on top of me. 3  I have been so unhappy that I have had difficulty sleeping. 0  I have felt sad or miserable. 0  I have been so unhappy that I have been crying. 0  The thought of harming myself has occurred to me. 0  Edinburgh Postnatal Depression Scale Total 3     Recent Labs    07/02/22 1300 07/03/22 0554  HGB 12.5 10.3*  HCT 36.8 30.3*  WBC 9.1 14.7*  PLT 289 228    Assessment/Plan: 42 y.o. [id number] postpartum day # 1  -Continue routine postpartum care -Lactation consult PRN for breastfeeding   -Acute blood loss anemia - hemodynamically stable and asymptomatic; start PO ferrous sulfate BID with stool softeners  -Immunization  status: Needs varicella and flu prior to discharge/ all immunizations up to date  Disposition: Continue inpatient postpartum care    LOS: 1 day   Keawe Marcello, CNM 07/03/2022, 12:21 PM

## 2022-07-03 NOTE — Inpatient Diabetes Management (Signed)
Inpatient Diabetes Program Recommendations  AACE/ADA: New Consensus Statement on Inpatient Glycemic Control (2015)  Target Ranges:  Prepandial:   less than 140 mg/dL      Peak postprandial:   less than 180 mg/dL (1-2 hours)      Critically ill patients:  140 - 180 mg/dL    Latest Reference Range & Units 07/02/22 12:53 07/02/22 17:17 07/02/22 20:58  Glucose-Capillary 70 - 99 mg/dL 87 79 86    Latest Reference Range & Units 07/03/22 08:31  Glucose-Capillary 70 - 99 mg/dL 63     Admit for Scheduled IOL   History: DM2  Home DM Meds: Humulin N 12 units QHS       Metformin 500 mg BID  Current Orders: Metformin 500 mg BID      CBG checks TID AC + HS    Per Office Visit note on 11/12/2021, pt was taking Metformin 500 mg BID prior to Pregnancy  Restarted Metformin this AM  CBG 86 this AM--CBGs well controlled yest.  If pt has any issues with Hypoglycemia, may need to reduce or stop the Metformin  Pt needs to follow up with PCP for further diabetes management      --Will follow patient during hospitalization--  Wyn Quaker RN, MSN, [place] Diabetes Coordinator Inpatient Glycemic Control Team Team Pager: [phone] (8a-5p)

## 2022-07-03 NOTE — Lactation Note (Signed)
This note was copied from a baby's chart. Lactation Consultation Note  Patient Name: Claudia Cannon [id number] Date: 07/03/2022 Reason for consult: Initial assessment;Term Age:40 hours  Maternal Data Has patient been taught Hand Expression?: Yes Does the patient have breastfeeding experience prior to this delivery?: Yes How long did the patient breastfeed?: almost 1 year  P6, SVD 12 hours ago. Mom has hx of DM. Mom has BF history with all other children of almost a year. She reports beginning with bottle feeding and increasing breastfeeding as her milk increases. She plans to follow the same pattern with this baby. Hypoglycemia protocol was initiated; baby has now had stable glucose levels with 1 low of 38.   Feeding Mother's Current Feeding Choice: Breast Milk and Formula  Baby has been tolerating formula feedings per parents. Mom reports attempting to BF, but baby not feeding well from breasts. LC offered to assist with breastfeeding, but mom is about to eat lunch and desires to wait. We discussed tips/strategies for getting baby to accept the breast, reminded mom of hand expression and attempting with early cues.   Lactation Tools Discussed/Used    Interventions Interventions: Breast feeding basics reviewed;Hand express;Education;Pace feeding (newborn feeding patterns/behaviors, hand express colostrum to encourage latch/feeding, offer breast every time before bottle, pace feeding to minimize preference, support available to assist with BF)  Reviewed normal course of lactation and milk supply and demand. Mom familiar with all education offered, and again says that she followed the same pattern with her other children. Encouraged her to call for BF assistance/support with next feeding attempt if she would like.  Discharge    Consult Status Consult Status: Follow-up (Mom to call at next feeding)  Whiteboard updated.  Lavonia Drafts 07/03/2022, 11:41 AM

## 2022-07-04 ENCOUNTER — Inpatient Hospital Stay: Payer: Medicaid Other

## 2022-07-04 DIAGNOSIS — O864 Pyrexia of unknown origin following delivery: Secondary | ICD-10-CM | POA: Diagnosis not present

## 2022-07-04 LAB — URINALYSIS, COMPLETE (UACMP) WITH MICROSCOPIC
Bilirubin Urine: NEGATIVE
Glucose, UA: NEGATIVE mg/dL
Ketones, ur: NEGATIVE mg/dL
Nitrite: NEGATIVE
Protein, ur: 30 mg/dL — AB
RBC / HPF: >50 RBC/hpf (ref 0–5)
Specific Gravity, Urine: 1.024 (ref 1.005–1.030)
pH: 5 (ref 5.0–8.0)

## 2022-07-04 LAB — LACTIC ACID, PLASMA
Lactic Acid, Venous: 2.6 mmol/L (ref 0.5–1.9)
Lactic Acid, Venous: 2.1 mmol/L (ref 0.5–1.9)

## 2022-07-04 LAB — COMPREHENSIVE METABOLIC PANEL
ALT: 12 U/L (ref 0–44)
AST: 21 U/L (ref 15–41)
Albumin: 2.7 g/dL — ABNORMAL LOW (ref 3.5–5.0)
Alkaline Phosphatase: 108 U/L (ref 38–126)
Anion gap: 8 (ref 5–15)
BUN: 23 mg/dL — ABNORMAL HIGH (ref 6–20)
CO2: 23 mmol/L (ref 22–32)
Calcium: 9.4 mg/dL (ref 8.9–10.3)
Chloride: 104 mmol/L (ref 98–111)
Creatinine, Ser: 0.84 mg/dL (ref 0.44–1.00)
GFR, Estimated: >60 mL/min (ref >60)
Glucose, Bld: 119 mg/dL — ABNORMAL HIGH (ref 70–99)
Potassium: 3 mmol/L — ABNORMAL LOW (ref 3.5–5.1)
Sodium: 135 mmol/L (ref 135–145)
Total Bilirubin: 0.7 mg/dL (ref 0.3–1.2)
Total Protein: 5.9 g/dL — ABNORMAL LOW (ref 6.5–8.1)

## 2022-07-04 LAB — CBC WITH DIFFERENTIAL/PLATELET
Abs Immature Granulocytes: 0.13 10*3/uL — ABNORMAL HIGH (ref 0.00–0.07)
Basophils Absolute: 0 10*3/uL (ref 0.0–0.1)
Basophils Relative: 0 %
Eosinophils Absolute: 0 10*3/uL (ref 0.0–0.5)
Eosinophils Relative: 0 %
HCT: 29.2 % — ABNORMAL LOW (ref 36.0–46.0)
Hemoglobin: 10 g/dL — ABNORMAL LOW (ref 12.0–15.0)
Immature Granulocytes: 1 %
Lymphocytes Relative: 11 %
Lymphs Abs: 1.7 10*3/uL (ref 0.7–4.0)
MCH: 29.9 pg (ref 26.0–34.0)
MCHC: 34.2 g/dL (ref 30.0–36.0)
MCV: 87.4 fL (ref 80.0–100.0)
Monocytes Absolute: 1.1 10*3/uL — ABNORMAL HIGH (ref 0.1–1.0)
Monocytes Relative: 7 %
Neutro Abs: 13.5 10*3/uL — ABNORMAL HIGH (ref 1.7–7.7)
Neutrophils Relative %: 81 %
Platelets: 228 10*3/uL (ref 150–400)
RBC: 3.34 MIL/uL — ABNORMAL LOW (ref 3.87–5.11)
RDW: 14.2 % (ref 11.5–15.5)
WBC: 16.5 10*3/uL — ABNORMAL HIGH (ref 4.0–10.5)
nRBC: 0 % (ref 0.0–0.2)

## 2022-07-04 LAB — GLUCOSE, CAPILLARY
Glucose-Capillary: 92 mg/dL (ref 70–99)
Glucose-Capillary: 89 mg/dL (ref 70–99)
Glucose-Capillary: 104 mg/dL — ABNORMAL HIGH (ref 70–99)
Glucose-Capillary: 132 mg/dL — ABNORMAL HIGH (ref 70–99)

## 2022-07-04 LAB — CBC
HCT: 32.1 % — ABNORMAL LOW (ref 36.0–46.0)
Hemoglobin: 10.8 g/dL — ABNORMAL LOW (ref 12.0–15.0)
MCH: 29.8 pg (ref 26.0–34.0)
MCHC: 33.6 g/dL (ref 30.0–36.0)
MCV: 88.4 fL (ref 80.0–100.0)
Platelets: 238 10*3/uL (ref 150–400)
RBC: 3.63 MIL/uL — ABNORMAL LOW (ref 3.87–5.11)
RDW: 14.2 % (ref 11.5–15.5)
WBC: 15.5 10*3/uL — ABNORMAL HIGH (ref 4.0–10.5)
nRBC: 0 % (ref 0.0–0.2)

## 2022-07-04 LAB — RESP PANEL BY RT-PCR (RSV, FLU A&B, COVID)  RVPGX2
Influenza A by PCR: NEGATIVE
Influenza B by PCR: NEGATIVE
Resp Syncytial Virus by PCR: NEGATIVE
SARS Coronavirus 2 by RT PCR: NEGATIVE

## 2022-07-04 LAB — APTT: aPTT: 33 seconds (ref 24–36)

## 2022-07-04 LAB — PROTIME-INR
INR: 1 (ref 0.8–1.2)
Prothrombin Time: 13.4 seconds (ref 11.4–15.2)

## 2022-07-04 MED ORDER — POTASSIUM CHLORIDE CRYS ER 20 MEQ PO TBCR
20.0000 meq | EXTENDED_RELEASE_TABLET | Freq: Two times a day (BID) | ORAL | Status: DC
  Administered 2022-07-04 – 2022-07-05 (×2): 20 meq via ORAL
  Filled 2022-07-04 (×2): qty 1

## 2022-07-04 MED ORDER — LACTATED RINGERS IV BOLUS (SEPSIS)
1000.0000 mL | Freq: Once | INTRAVENOUS | Status: AC
  Administered 2022-07-04: 1000 mL via INTRAVENOUS

## 2022-07-04 MED ORDER — CLINDAMYCIN PHOSPHATE 900 MG/50ML IV SOLN
900.0000 mg | Freq: Three times a day (TID) | INTRAVENOUS | Status: DC
  Administered 2022-07-04 – 2022-07-05 (×3): 900 mg via INTRAVENOUS
  Filled 2022-07-04 (×4): qty 50

## 2022-07-04 MED ORDER — LACTATED RINGERS IV BOLUS (SEPSIS)
500.0000 mL | Freq: Once | INTRAVENOUS | Status: AC
  Administered 2022-07-04: 500 mL via INTRAVENOUS

## 2022-07-04 MED ORDER — SODIUM CHLORIDE 0.9 % IV SOLN: INTRAVENOUS | Status: DC | PRN

## 2022-07-04 MED ORDER — GENTAMICIN SULFATE 40 MG/ML IJ SOLN
5.0000 mg/kg | INTRAVENOUS | Status: DC
  Administered 2022-07-04: 380 mg via INTRAVENOUS
  Filled 2022-07-04 (×2): qty 9.5

## 2022-07-04 NOTE — Sepsis Progress Note (Signed)
Notified bedside nurse of need to draw repeat lactic acid. 

## 2022-07-04 NOTE — Progress Notes (Signed)
Discussed assessment and plan of care with Dr. Ouida Sills.  Claudia Cannon has a history of fever of unknown origin.  Was evaluated in October by Rheumatology but unable to determine source of fever.  Will continue to monitor for at least 24 hours after initial fever.  Plan for Chest X-ray, blood cultures, and UA if she has another temperature of 100.4 or greater.   Drinda Butts, CNM Certified Nurse Midwife [facility]

## 2022-07-04 NOTE — Progress Notes (Signed)
Postpartum Day  2  Subjective: no complaints, up ad lib, voiding, and tolerating PO  Doing well, no concerns. Ambulating without difficulty, pain managed with PO meds, tolerating regular diet, and voiding without difficulty.   No fever/chills, chest pain, shortness of breath, nausea/vomiting, or leg pain. No nipple or breast pain. No headache, visual changes, or RUQ/epigastric pain.  Objective: BP 101/68 (BP Location: Left Arm)   Pulse (!) 111   Temp 97.8 F (36.6 C) (Oral)   Resp 20   Ht 5' (1.524 m)   Wt 75.8 kg   LMP 09/29/2021 (Approximate)   SpO2 98%   Breastfeeding Unknown   BMI 32.61 kg/m    Physical Exam:  General: alert, cooperative, and no distress Breasts: soft/nontender CV: RRR Pulm: nl effort, CTABL Abdomen: soft, non-tender, active bowel sounds Uterine Fundus: firm, non-tender Perineum: minimal edema, intact Lochia: appropriate DVT Evaluation: No evidence of DVT seen on physical exam.  Recent Labs    07/02/22 1300 07/03/22 0554  HGB 12.5 10.3*  HCT 36.8 30.3*  WBC 9.1 14.7*  PLT 289 228    Assessment/Plan: 42 y.o. [id number] postpartum day # 2  -Continue routine postpartum care -Lactation consult PRN for breastfeeding  -Acute blood loss anemia - hemodynamically stable and asymptomatic; start PO ferrous sulfate BID with stool softeners  -Fever noted last night, T-Max 101.3.  Responded well to Tylenol (last dose 0443), now 97.8.  Stat CBC ordered this morning - pending.  Marshal asymptomatic, uterus non-tender, denies urinary symptoms, and no changes in vaginal bleeding.  Respiratory panel negative for Flu, RSV, and Covid.  Will discuss assessment with Dr. Ouida Sills.   -Immunization status:   needs Varicella prior to discharge   Disposition: Continue inpatient postpartum care    LOS: 2 days   Minda Meo, Mallory Shirk 07/04/2022, 9:49 AM   ----- Drinda Butts  Certified Nurse Midwife [facility]

## 2022-07-04 NOTE — Progress Notes (Signed)
Pt transported to xray.

## 2022-07-04 NOTE — Progress Notes (Signed)
Postpartum Day  2  Subjective: up ad lib, voiding, and tolerating PO, reports hot/cold feeling and body aches   Doing well, no concerns. Ambulating without difficulty, pain managed with PO meds, tolerating regular diet, and voiding without difficulty.   No fever/chills, chest pain, shortness of breath, nausea/vomiting, or leg pain. No nipple or breast pain. No headache, visual changes, or RUQ/epigastric pain.  Objective: BP 124/69 (BP Location: Left Arm)   Pulse (!) 144   Temp 99.3 F (37.4 C) (Oral)   Resp 19   Ht 5' (1.524 m)   Wt 75.8 kg   LMP 09/29/2021 (Approximate)   SpO2 98%   Breastfeeding Unknown   BMI 32.61 kg/m     Recent Labs    07/03/22 0554 07/04/22 0916  HGB 10.3* 10.8*  HCT 30.3* 32.1*  WBC 14.7* 15.5*  PLT 228 238   Component     Latest Ref Rng 07/04/2022  Lactic Acid, Venous     0.5 - 1.9 mmol/L 2.6 (HH)     Legend: (HH) High Panic  Assessment/Plan: 42 y.o. [id number] postpartum day # 2  -Temperature 99.5->100.3 with Tylenol and Motrin given, reports fever/chills and body aches, tachycardia now in 140's -Sepsis protocol initiated.   -Fluid bolus ordered  -Gent and Clindamycin ordered  -Continuous telemonitoring ordered  -Consult with RRT RN placed - will continue to closely monitor  -Dr. Ouida Sills updated on plan of care    Disposition: Continue inpatient postpartum care    LOS: 2 days   Minda Meo, CNM 07/04/2022, 3:20 PM   ----- Drinda Butts  Certified Nurse Midwife [facility]

## 2022-07-04 NOTE — Progress Notes (Signed)
Pt back from x-ray.

## 2022-07-04 NOTE — Sepsis Progress Note (Signed)
Code Sepsis protocol being monitored by eLink. ?

## 2022-07-05 DIAGNOSIS — O864 Pyrexia of unknown origin following delivery: Secondary | ICD-10-CM | POA: Diagnosis not present

## 2022-07-05 LAB — CBC
HCT: 28.2 % — ABNORMAL LOW (ref 36.0–46.0)
Hemoglobin: 9.7 g/dL — ABNORMAL LOW (ref 12.0–15.0)
MCH: 30 pg (ref 26.0–34.0)
MCHC: 34.4 g/dL (ref 30.0–36.0)
MCV: 87.3 fL (ref 80.0–100.0)
Platelets: 180 10*3/uL (ref 150–400)
RBC: 3.23 MIL/uL — ABNORMAL LOW (ref 3.87–5.11)
RDW: 14.3 % (ref 11.5–15.5)
WBC: 17.3 10*3/uL — ABNORMAL HIGH (ref 4.0–10.5)
nRBC: 0 % (ref 0.0–0.2)

## 2022-07-05 LAB — GLUCOSE, CAPILLARY
Glucose-Capillary: 72 mg/dL (ref 70–99)
Glucose-Capillary: 91 mg/dL (ref 70–99)
Glucose-Capillary: 120 mg/dL — ABNORMAL HIGH (ref 70–99)

## 2022-07-05 MED ORDER — FERROUS SULFATE 325 (65 FE) MG PO TABS: 325.0000 mg | ORAL_TABLET | Freq: Two times a day (BID) | ORAL | 3 refills | Status: DC

## 2022-07-05 MED ORDER — WITCH HAZEL-GLYCERIN EX PADS: 1.0000 | MEDICATED_PAD | CUTANEOUS | 12 refills | Status: DC | PRN

## 2022-07-05 MED ORDER — DOXYCYCLINE HYCLATE 100 MG PO CAPS: 100.0000 mg | ORAL_CAPSULE | Freq: Two times a day (BID) | ORAL | 0 refills | Status: AC

## 2022-07-05 MED ORDER — SODIUM CHLORIDE 0.9 % IV SOLN
100.0000 mg | Freq: Two times a day (BID) | INTRAVENOUS | Status: DC
  Administered 2022-07-05: 100 mg via INTRAVENOUS
  Filled 2022-07-05 (×2): qty 100

## 2022-07-05 MED ORDER — METFORMIN HCL 500 MG PO TABS: 500.0000 mg | ORAL_TABLET | Freq: Two times a day (BID) | ORAL | 1 refills | Status: AC

## 2022-07-05 MED ORDER — AMOXICILLIN-POT CLAVULANATE 875-125 MG PO TABS: 1.0000 | ORAL_TABLET | Freq: Two times a day (BID) | ORAL | 0 refills | Status: AC

## 2022-07-05 MED ORDER — BENZOCAINE-MENTHOL 20-0.5 % EX AERO: 1.0000 | INHALATION_SPRAY | CUTANEOUS | Status: DC | PRN

## 2022-07-05 MED ORDER — SIMETHICONE 80 MG PO CHEW: 80.0000 mg | CHEWABLE_TABLET | ORAL | 0 refills | Status: DC | PRN

## 2022-07-05 MED ORDER — SODIUM CHLORIDE 0.9 % IV SOLN
3.0000 g | Freq: Four times a day (QID) | INTRAVENOUS | Status: DC
  Administered 2022-07-05: 3 g via INTRAVENOUS
  Filled 2022-07-05 (×3): qty 8

## 2022-07-05 MED ORDER — COCONUT OIL OIL: 1.0000 | TOPICAL_OIL | 0 refills | Status: DC | PRN

## 2022-07-05 MED ORDER — DIBUCAINE (PERIANAL) 1 % EX OINT: 1.0000 | TOPICAL_OINTMENT | CUTANEOUS | Status: DC | PRN

## 2022-07-05 MED ORDER — ACETAMINOPHEN 325 MG PO TABS: 650.0000 mg | ORAL_TABLET | ORAL | Status: AC | PRN

## 2022-07-05 MED ORDER — SENNOSIDES-DOCUSATE SODIUM 8.6-50 MG PO TABS: 2.0000 | ORAL_TABLET | Freq: Every day | ORAL | Status: DC

## 2022-07-05 NOTE — Discharge Instructions (Signed)
Discharge Instructions:   If there are any new medications, they have been ordered and will be available for pickup at the listed pharmacy on your way home from the hospital.   Call office if you have any of the following: headache, visual changes, fever >101.0 F, chills, shortness of breath, breast concerns, excessive vaginal bleeding, incision drainage or problems, leg pain or redness, depression or any other concerns. If you have vaginal discharge with an odor, let your doctor know.   It is normal to bleed for up to 6 weeks. You should not soak through more than 1 pad in 1 hour. If you have a blood clot larger than your fist with continued bleeding, call your doctor.   Activity: Do not lift > 10 lbs for 6 weeks (do not lift anything heavier than your baby). No intercourse, tampons, swimming pools, hot tubs, baths (only showers) for 6 weeks.  No driving for 1-2 weeks. Continue prenatal vitamin, especially if breastfeeding. Increase calories and fluids (water) while breastfeeding.   Your milk will come in, in the next couple of days (right now it is colostrum). You may have a slight fever when your milk comes in, but it should go away on its own.  If it does not, and rises above 101 F please call the doctor. You will also feel achy and your breasts will be firm. They will also start to leak. If you are breastfeeding, continue as you have been and you can pump/express milk for comfort.   If you have too much milk, your breasts can become engorged, which could lead to mastitis. This is an infection of the milk ducts. It can be very painful and you will need to notify your doctor to obtain a prescription for antibiotics. You can also treat it with a shower or hot/cold compress.   For concerns about your baby, please call your pediatrician.  For breastfeeding concerns, the lactation consultant can be reached at [phone].   Postpartum blues (feelings of happy one minute and sad another minute)  are normal for the first few weeks but if it gets worse let your doctor know.   Congratulations! We enjoyed caring for you and your new bundle of joy!

## 2022-07-05 NOTE — Lactation Note (Signed)
This note was copied from a baby's chart. Lactation Consultation Note  Patient Name: Claudia Cannon [id number] Date: 07/05/2022 Reason for consult: Follow-up assessment;Term Age:40 hours  Maternal Data This is mom's 5th baby, SVD.  Mom with history of AMA and DM. On follow-up today mom reports she has been breastfeeding and the baby with latch and breastfeed about 10 minutes each side but then becomes fussy and she then bottle feeds. Her plan is to continue to offer the baby to breastfeed and also offer formula if he is not satisfied after breastfeeding. She has combination fed like she is doing now with all her other children and continues breastfeeding for about a year. Has patient been taught Hand Expression?: Yes Does the patient have breastfeeding experience prior to this delivery?: Yes How long did the patient breastfeed?: mom reports she has combination fed her other children and once her milk was in she also breastfed for almost a year.  Feeding Mother's Current Feeding Choice: Breast Milk and Formula  Lactation Tools Discussed/Used : Harmony manual breastpump.  Interventions Interventions: Hand pump;Education: discussed with mom how the body knows to make milk and the importance of consistent emptying to maximize her milk production. Mom does not have a pump and home. Provided and instructed mom on the Harmony manual pump. Mom verbalized understanding of information provided. Reviewed with mom how to know the baby is getting enough, frequency of feeds, and a proactive plan to manage breast engorgement as mom recalls becoming engorged with her last child.  Discharge Discharge Education: Engorgement and breast care;Warning signs for feeding baby;Outpatient recommendation Pump: Manual  Consult Status Consult Status: Complete Date: 07/05/22 Follow-up type: In-patient  Update provided to care nurse.  Claudia Cannon 07/05/2022, 12:05 PM

## 2022-07-05 NOTE — Consult Note (Signed)
[facility]  Total days of antibiotics 2 Reason for Consult: post partum fever/SIRS   Referring Physician: dickerson  Principal Problem:   NSVD (normal spontaneous vaginal delivery) Active Problems:   Encounter for induction of labor   Postpartum fever    HPI: Claudia Cannon is a 42 y.o. female with G6P5 admitted on 1/23 at 1pm for start of labor, and she had uncomplicated vaginal delivery. Baby boy with apgar of 7 and 9. She had first degree laceration. EBL 165ml,delivered that evening about 2200. She started to have fever the evening on 1/24 and then the following morning of 1/25. She underwent infectious work up and started on clindamycin and gentamicin. This morning she states she feels much better, and no documented fevers. ID asked to weigh in on management. Patient denies cough, nightsweats, no vaginal discharge or dysuria or flank pain.   Past Medical History:  Diagnosis Date   Diabetes mellitus without complication (HCC)     Allergies: No Known Allergies   MEDICATIONS:  ferrous sulfate  325 mg Oral BID WC   ibuprofen  600 mg Oral Q6H   metFORMIN  500 mg Oral BID WC   oxytocin 40 units in LR 1000 mL  333 mL Intravenous Once   potassium chloride  20 mEq Oral BID   prenatal multivitamin  1 tablet Oral Q1200   senna-docusate  2 tablet Oral Daily   Tdap  0.5 mL Intramuscular Once    Social History   Tobacco Use   Smoking status: Never   Smokeless tobacco: Never  Vaping Use   Vaping Use: Never used  Substance Use Topics   Alcohol use: No   Drug use: No    Family History  Problem Relation Age of Onset   Diabetes Mother    Hypertension Mother    Hyperlipidemia Mother    Chronic Renal Failure Mother      Review of Systems  Constitutional: Negative for fever, chills, diaphoresis, activity change, appetite change, fatigue and unexpected weight change.  HENT: Negative for congestion, sore throat, rhinorrhea, sneezing,  trouble swallowing and sinus pressure.  Eyes: Negative for photophobia and visual disturbance.  Respiratory: Negative for cough, chest tightness, shortness of breath, wheezing and stridor.  Cardiovascular: Negative for chest pain, palpitations and leg swelling.  Gastrointestinal: Negative for nausea, vomiting, abdominal pain, diarrhea, constipation, blood in stool, abdominal distention and anal bleeding.  Genitourinary: Negative for dysuria, hematuria, flank pain and difficulty urinating.  Musculoskeletal: Negative for myalgias, back pain, joint swelling, arthralgias and gait problem.  Skin: Negative for color change, pallor, rash and wound.  Neurological: Negative for dizziness, tremors, weakness and light-headedness.  Hematological: Negative for adenopathy. Does not bruise/bleed easily.  Psychiatric/Behavioral: Negative for behavioral problems, confusion, sleep disturbance, dysphoric mood, decreased concentration and agitation.    OBJECTIVE: Temp:  [97.6 F (36.4 C)-100.3 F (37.9 C)] 98.2 F (36.8 C) (01/26 0751) Pulse Rate:  [100-145] 105 (01/26 0943) Resp:  [18-34] 34 (01/26 0345) BP: (88-124)/(54-73) 88/57 (01/26 0943) SpO2:  [96 %-100 %] 98 % (01/26 0943) Physical Exam  Constitutional:  oriented to person, place, and time. appears well-developed and well-nourished. No distress.  HENT: [place]/AT, PERRLA, no scleral icterus Mouth/Throat: Oropharynx is clear and moist. No oropharyngeal exudate.  Cardiovascular: Normal rate, regular rhythm and normal heart sounds. Exam reveals no gallop and no friction rub.  No murmur heard.  Pulmonary/Chest: Effort normal and breath sounds normal. No respiratory distress.  has no wheezes.  Neck =  supple, no nuchal rigidity Abdominal: Soft. Bowel sounds are normal.  exhibits no distension. There is no tenderness.  Lymphadenopathy: no cervical adenopathy. No axillary adenopathy Neurological: alert and oriented to person, place, and time.  Skin: Skin is  warm and dry. No rash noted. No erythema.  Psychiatric: a normal mood and affect.  behavior is normal.    LABS: Results for orders placed or performed during the hospital encounter of 07/02/22 (from the past 48 hour(s))  Glucose, capillary     Status: Abnormal   Collection Time: 07/03/22 12:02 PM  Result Value Ref Range   Glucose-Capillary 109 (H) 70 - 99 mg/dL    Comment: Glucose reference range applies only to samples taken after fasting for at least 8 hours.  Glucose, capillary     Status: Abnormal   Collection Time: 07/03/22  6:06 PM  Result Value Ref Range   Glucose-Capillary 160 (H) 70 - 99 mg/dL    Comment: Glucose reference range applies only to samples taken after fasting for at least 8 hours.   Comment 1 Document in Chart   Glucose, capillary     Status: Abnormal   Collection Time: 07/03/22 10:07 PM  Result Value Ref Range   Glucose-Capillary 101 (H) 70 - 99 mg/dL    Comment: Glucose reference range applies only to samples taken after fasting for at least 8 hours.  Resp panel by RT-PCR (RSV, Flu A&B, Covid) Anterior Nasal Swab     Status: None   Collection Time: 07/04/22  4:45 AM   Specimen: Anterior Nasal Swab  Result Value Ref Range   SARS Coronavirus 2 by RT PCR NEGATIVE NEGATIVE    Comment: (NOTE) SARS-CoV-2 target nucleic acids are NOT DETECTED.  The SARS-CoV-2 RNA is generally detectable in upper respiratory specimens during the acute phase of infection. The lowest concentration of SARS-CoV-2 viral copies this assay can detect is 138 copies/mL. A negative result does not preclude SARS-Cov-2 infection and should not be used as the sole basis for treatment or other patient management decisions. A negative result may occur with  improper specimen collection/handling, submission of specimen other than nasopharyngeal swab, presence of viral mutation(s) within the areas targeted by this assay, and inadequate number of viral copies(<138 copies/mL). A negative result  must be combined with clinical observations, patient history, and epidemiological information. The expected result is Negative.  Fact Sheet for Patients:  BloggerCourse.comhttps://www.fda.gov/media/152166/download  Fact Sheet for Healthcare Providers:  SeriousBroker.ithttps://www.fda.gov/media/152162/download  This test is no t yet approved or cleared by the [place] FDA and  has been authorized for detection and/or diagnosis of SARS-CoV-2 by FDA under an Emergency Use Authorization (EUA). This EUA will remain  in effect (meaning this test can be used) for the duration of the COVID-19 declaration under Section 564(b)(1) of the Act, 21 U.S.C.section 360bbb-3(b)(1), unless the authorization is terminated  or revoked sooner.       Influenza A by PCR NEGATIVE NEGATIVE   Influenza B by PCR NEGATIVE NEGATIVE    Comment: (NOTE) The Xpert Xpress SARS-CoV-2/FLU/RSV plus assay is intended as an aid in the diagnosis of influenza from Nasopharyngeal swab specimens and should not be used as a sole basis for treatment. Nasal washings and aspirates are unacceptable for Xpert Xpress SARS-CoV-2/FLU/RSV testing.  Fact Sheet for Patients: BloggerCourse.comhttps://www.fda.gov/media/152166/download  Fact Sheet for Healthcare Providers: SeriousBroker.ithttps://www.fda.gov/media/152162/download  This test is not yet approved or cleared by the [place] FDA and has been authorized for detection and/or diagnosis of SARS-CoV-2 by FDA under an  Emergency Use Authorization (EUA). This EUA will remain in effect (meaning this test can be used) for the duration of the COVID-19 declaration under Section 564(b)(1) of the Act, 21 U.S.C. section 360bbb-3(b)(1), unless the authorization is terminated or revoked.     Resp Syncytial Virus by PCR NEGATIVE NEGATIVE    Comment: (NOTE) Fact Sheet for Patients: EntrepreneurPulse.com.au  Fact Sheet for Healthcare Providers: IncredibleEmployment.be  This test is not yet approved or  cleared by the [place] FDA and has been authorized for detection and/or diagnosis of SARS-CoV-2 by FDA under an Emergency Use Authorization (EUA). This EUA will remain in effect (meaning this test can be used) for the duration of the COVID-19 declaration under Section 564(b)(1) of the Act, 21 U.S.C. section 360bbb-3(b)(1), unless the authorization is terminated or revoked.  Performed at [facility], [address] [place], [place] [address]   Glucose, capillary     Status: None   Collection Time: 07/04/22  7:50 AM  Result Value Ref Range   Glucose-Capillary 92 70 - 99 mg/dL    Comment: Glucose reference range applies only to samples taken after fasting for at least 8 hours.  CBC     Status: Abnormal   Collection Time: 07/04/22  9:16 AM  Result Value Ref Range   WBC 15.5 (H) 4.0 - 10.5 K/uL   RBC 3.63 (L) 3.87 - 5.11 MIL/uL   Hemoglobin 10.8 (L) 12.0 - 15.0 g/dL   HCT 32.1 (L) 36.0 - 46.0 %   MCV 88.4 80.0 - 100.0 fL   MCH 29.8 26.0 - 34.0 pg   MCHC 33.6 30.0 - 36.0 g/dL   RDW 14.2 11.5 - 15.5 %   Platelets 238 150 - 400 K/uL   nRBC 0.0 0.0 - 0.2 %    Comment: Performed at [facility], [address] [place], [place] [address]  Glucose, capillary     Status: None   Collection Time: 07/04/22 11:46 AM  Result Value Ref Range   Glucose-Capillary 89 70 - 99 mg/dL    Comment: Glucose reference range applies only to samples taken after fasting for at least 8 hours.  Urinalysis, Complete w Microscopic -Urine, Catheterized     Status: Abnormal   Collection Time: 07/04/22  2:13 PM  Result Value Ref Range   Color, Urine YELLOW (A) YELLOW   APPearance HAZY (A) CLEAR   Specific Gravity, Urine 1.024 1.005 - 1.030   pH 5.0 5.0 - 8.0   Glucose, UA NEGATIVE NEGATIVE mg/dL   Hgb urine dipstick LARGE (A) NEGATIVE   Bilirubin Urine NEGATIVE NEGATIVE   Ketones, ur NEGATIVE NEGATIVE mg/dL   Protein, ur 30 (A) NEGATIVE mg/dL   Nitrite NEGATIVE NEGATIVE    Leukocytes,Ua SMALL (A) NEGATIVE   RBC / HPF >50 0 - 5 RBC/hpf   WBC, UA 21-50 0 - 5 WBC/hpf   Bacteria, UA MANY (A) NONE SEEN   Squamous Epithelial / HPF 0-5 0 - 5 /HPF   Mucus PRESENT     Comment: Performed at [facility], [address] [place], [place] [address]  Culture, blood (Routine X 2) w Reflex to ID Panel     Status: None (Preliminary result)   Collection Time: 07/04/22  2:20 PM   Specimen: BLOOD  Result Value Ref Range   Specimen Description BLOOD RIGHT ANTECUBITAL    Special Requests      BOTTLES DRAWN AEROBIC AND ANAEROBIC Blood Culture adequate volume   Culture  NO GROWTH < 24 HOURS Performed at [facility], [address] [place], [place] [address]    Report Status PENDING   Culture, blood (Routine X 2) w Reflex to ID Panel     Status: None (Preliminary result)   Collection Time: 07/04/22  2:20 PM   Specimen: BLOOD  Result Value Ref Range   Specimen Description BLOOD LEFT ANTECUBITAL    Special Requests      BOTTLES DRAWN AEROBIC AND ANAEROBIC Blood Culture adequate volume   Culture      NO GROWTH < 24 HOURS Performed at [facility], [address] [place], [place] [address]    Report Status PENDING   Lactic acid, plasma     Status: Abnormal   Collection Time: 07/04/22  2:20 PM  Result Value Ref Range   Lactic Acid, Venous 2.6 (HH) 0.5 - 1.9 mmol/L    Comment: CRITICAL RESULT CALLED TO, READ BACK BY AND VERIFIED WITH ANNA WILLIAMS AT 1505 ON 07/04/22 BY SS Performed at [facility], [address] [place], [place] [address]   CBC with Differential     Status: Abnormal   Collection Time: 07/04/22  4:09 PM  Result Value Ref Range   WBC 16.5 (H) 4.0 - 10.5 K/uL   RBC 3.34 (L) 3.87 - 5.11 MIL/uL   Hemoglobin 10.0 (L) 12.0 - 15.0 g/dL   HCT 89.1 (L) 69.4 - 50.3 %   MCV 87.4 80.0 - 100.0 fL   MCH 29.9 26.0 - 34.0 pg   MCHC 34.2 30.0 - 36.0 g/dL   RDW 88.8 28.0 - 03.4 %   Platelets 228 150 - 400 K/uL   nRBC  0.0 0.0 - 0.2 %   Neutrophils Relative % 81 %   Neutro Abs 13.5 (H) 1.7 - 7.7 K/uL   Lymphocytes Relative 11 %   Lymphs Abs 1.7 0.7 - 4.0 K/uL   Monocytes Relative 7 %   Monocytes Absolute 1.1 (H) 0.1 - 1.0 K/uL   Eosinophils Relative 0 %   Eosinophils Absolute 0.0 0.0 - 0.5 K/uL   Basophils Relative 0 %   Basophils Absolute 0.0 0.0 - 0.1 K/uL   Immature Granulocytes 1 %   Abs Immature Granulocytes 0.13 (H) 0.00 - 0.07 K/uL    Comment: Performed at [facility], [address] [place], [place] [address]  Comprehensive metabolic panel     Status: Abnormal   Collection Time: 07/04/22  4:09 PM  Result Value Ref Range   Sodium 135 135 - 145 mmol/L   Potassium 3.0 (L) 3.5 - 5.1 mmol/L   Chloride 104 98 - 111 mmol/L   CO2 23 22 - 32 mmol/L   Glucose, Bld 119 (H) 70 - 99 mg/dL    Comment: Glucose reference range applies only to samples taken after fasting for at least 8 hours.   BUN 23 (H) 6 - 20 mg/dL   Creatinine, Ser 5.05 0.44 - 1.00 mg/dL   Calcium 9.4 8.9 - 69.7 mg/dL   Total Protein 5.9 (L) 6.5 - 8.1 g/dL   Albumin 2.7 (L) 3.5 - 5.0 g/dL   AST 21 15 - 41 U/L   ALT 12 0 - 44 U/L   Alkaline Phosphatase 108 38 - 126 U/L   Total Bilirubin 0.7 0.3 - 1.2 mg/dL   GFR, Estimated >94 >80 mL/min    Comment: (NOTE) Calculated using the CKD-EPI Creatinine Equation (2021)    Anion gap 8 5 - 15  Comment: Performed at [facility], [address] [place], [place] [address]  Protime-INR     Status: None   Collection Time: 07/04/22  4:09 PM  Result Value Ref Range   Prothrombin Time 13.4 11.4 - 15.2 seconds   INR 1.0 0.8 - 1.2    Comment: (NOTE) INR goal varies based on device and disease states. Performed at [facility], [address] [place], [place] [address]   APTT     Status: None   Collection Time: 07/04/22  4:09 PM  Result Value Ref Range   aPTT 33 24 - 36 seconds    Comment: Performed at [facility], [address]  [place], [place] [address]  Glucose, capillary     Status: Abnormal   Collection Time: 07/04/22  5:07 PM  Result Value Ref Range   Glucose-Capillary 104 (H) 70 - 99 mg/dL    Comment: Glucose reference range applies only to samples taken after fasting for at least 8 hours.  Lactic acid, plasma     Status: Abnormal   Collection Time: 07/04/22  5:09 PM  Result Value Ref Range   Lactic Acid, Venous 2.1 (HH) 0.5 - 1.9 mmol/L    Comment: CRITICAL VALUE NOTED. VALUE IS CONSISTENT WITH PREVIOUSLY REPORTED/CALLED VALUE MU Performed at [facility], [address] [place], [place] [address]   Glucose, capillary     Status: Abnormal   Collection Time: 07/04/22 10:10 PM  Result Value Ref Range   Glucose-Capillary 132 (H) 70 - 99 mg/dL    Comment: Glucose reference range applies only to samples taken after fasting for at least 8 hours.  CBC     Status: Abnormal   Collection Time: 07/05/22  6:34 AM  Result Value Ref Range   WBC 17.3 (H) 4.0 - 10.5 K/uL   RBC 3.23 (L) 3.87 - 5.11 MIL/uL   Hemoglobin 9.7 (L) 12.0 - 15.0 g/dL   HCT 02.5 (L) 85.2 - 77.8 %   MCV 87.3 80.0 - 100.0 fL   MCH 30.0 26.0 - 34.0 pg   MCHC 34.4 30.0 - 36.0 g/dL   RDW 24.2 35.3 - 61.4 %   Platelets 180 150 - 400 K/uL   nRBC 0.0 0.0 - 0.2 %    Comment: Performed at [facility], [address] [place], [place] [address]  Glucose, capillary     Status: None   Collection Time: 07/05/22  8:30 AM  Result Value Ref Range   Glucose-Capillary 72 70 - 99 mg/dL    Comment: Glucose reference range applies only to samples taken after fasting for at least 8 hours.    MICRO: Blood cx from 1/25 is pending IMAGING: DG Chest 2 View  Result Date: 07/04/2022 CLINICAL DATA:  Tachycardia. EXAM: CHEST - 2 VIEW COMPARISON:  Chest x-ray dated May 04, 2019. FINDINGS: The heart size and mediastinal contours are within normal limits. Both lungs are clear. The visualized skeletal structures are unremarkable. IMPRESSION: No  active cardiopulmonary disease. Electronically Signed   By: Obie Dredge M.D.   On: 07/04/2022 17:49    HISTORICAL MICRO/IMAGING  Assessment/Plan:  43yo F with post partum fever concern for endometritis. Currently on clindamycin and gentamicin.  -recommend to switch to amp/sub plus iv doxy - would like to follow up on blood cx for 48hrs before switching to orals abtx - if nothing identified, can do 7 day course of amox/clav plus oral doxycycline  Breastfeeding = would pump and dump breastmilk  while on abtx then can resume breastfeeding  Caren Griffins [facility] [phone]

## 2022-07-05 NOTE — Progress Notes (Signed)
Patient discharged home with infant. Discharge instructions and prescriptions given and reviewed with patient. Patient verbalized understanding. Escorted out by volunteer.  

## 2022-07-07 LAB — URINE CULTURE: Culture: >=100000 — AB

## 2022-07-09 LAB — CULTURE, BLOOD (ROUTINE X 2)
Culture: NO GROWTH
Culture: NO GROWTH
Special Requests: ADEQUATE
Special Requests: ADEQUATE

## 2022-11-03 ENCOUNTER — Encounter: Payer: Self-pay | Admitting: Emergency Medicine

## 2022-11-03 ENCOUNTER — Other Ambulatory Visit: Payer: Self-pay

## 2022-11-03 ENCOUNTER — Emergency Department: Payer: Medicaid Other

## 2022-11-03 ENCOUNTER — Emergency Department
Admission: EM | Admit: 2022-11-03 | Discharge: 2022-11-03 | Disposition: A | Discharge status: Home / Self Care | Payer: Medicaid Other | Attending: Student in an Organized Health Care Education/Training Program | Admitting: Student in an Organized Health Care Education/Training Program

## 2022-11-03 DIAGNOSIS — Z1152 Encounter for screening for COVID-19: Secondary | ICD-10-CM | POA: Insufficient documentation

## 2022-11-03 DIAGNOSIS — N3000 Acute cystitis without hematuria: Secondary | ICD-10-CM | POA: Diagnosis not present

## 2022-11-03 DIAGNOSIS — M791 Myalgia, unspecified site: Secondary | ICD-10-CM | POA: Diagnosis not present

## 2022-11-03 DIAGNOSIS — R509 Fever, unspecified: Secondary | ICD-10-CM | POA: Diagnosis present

## 2022-11-03 LAB — CBC WITH DIFFERENTIAL/PLATELET
Abs Immature Granulocytes: 0.11 10*3/uL — ABNORMAL HIGH (ref 0.00–0.07)
Basophils Absolute: 0 10*3/uL (ref 0.0–0.1)
Basophils Relative: 0 %
Eosinophils Absolute: 0 10*3/uL (ref 0.0–0.5)
Eosinophils Relative: 0 %
HCT: 38.3 % (ref 36.0–46.0)
Hemoglobin: 13 g/dL (ref 12.0–15.0)
Immature Granulocytes: 1 %
Lymphocytes Relative: 6 %
Lymphs Abs: 0.9 10*3/uL (ref 0.7–4.0)
MCH: 29.1 pg (ref 26.0–34.0)
MCHC: 33.9 g/dL (ref 30.0–36.0)
MCV: 85.9 fL (ref 80.0–100.0)
Monocytes Absolute: 0.9 10*3/uL (ref 0.1–1.0)
Monocytes Relative: 6 %
Neutro Abs: 13.5 10*3/uL — ABNORMAL HIGH (ref 1.7–7.7)
Neutrophils Relative %: 87 %
Platelets: 271 10*3/uL (ref 150–400)
RBC: 4.46 MIL/uL (ref 3.87–5.11)
RDW: 13.1 % (ref 11.5–15.5)
WBC: 15.4 10*3/uL — ABNORMAL HIGH (ref 4.0–10.5)
nRBC: 0 % (ref 0.0–0.2)

## 2022-11-03 LAB — BASIC METABOLIC PANEL
Anion gap: 11 (ref 5–15)
BUN: 12 mg/dL (ref 6–20)
CO2: 24 mmol/L (ref 22–32)
Calcium: 8.5 mg/dL — ABNORMAL LOW (ref 8.9–10.3)
Chloride: 100 mmol/L (ref 98–111)
Creatinine, Ser: 0.69 mg/dL (ref 0.44–1.00)
GFR, Estimated: >60 mL/min (ref >60)
Glucose, Bld: 180 mg/dL — ABNORMAL HIGH (ref 70–99)
Potassium: 3.4 mmol/L — ABNORMAL LOW (ref 3.5–5.1)
Sodium: 135 mmol/L (ref 135–145)

## 2022-11-03 LAB — URINALYSIS, ROUTINE W REFLEX MICROSCOPIC
Bilirubin Urine: NEGATIVE
Glucose, UA: NEGATIVE mg/dL
Hgb urine dipstick: NEGATIVE
Ketones, ur: 20 mg/dL — AB
Nitrite: NEGATIVE
Protein, ur: 100 mg/dL — AB
Specific Gravity, Urine: 1.033 — ABNORMAL HIGH (ref 1.005–1.030)
WBC, UA: >50 WBC/hpf (ref 0–5)
pH: 5 (ref 5.0–8.0)

## 2022-11-03 LAB — HEPATIC FUNCTION PANEL
ALT: 38 U/L (ref 0–44)
AST: 30 U/L (ref 15–41)
Albumin: 4.1 g/dL (ref 3.5–5.0)
Alkaline Phosphatase: 80 U/L (ref 38–126)
Bilirubin, Direct: 0.3 mg/dL — ABNORMAL HIGH (ref 0.0–0.2)
Indirect Bilirubin: 1.5 mg/dL — ABNORMAL HIGH (ref 0.3–0.9)
Total Bilirubin: 1.8 mg/dL — ABNORMAL HIGH (ref 0.3–1.2)
Total Protein: 7.4 g/dL (ref 6.5–8.1)

## 2022-11-03 LAB — SARS CORONAVIRUS 2 BY RT PCR: SARS Coronavirus 2 by RT PCR: NEGATIVE

## 2022-11-03 LAB — LIPASE, BLOOD: Lipase: 23 U/L (ref 11–51)

## 2022-11-03 MED ORDER — CEPHALEXIN 500 MG PO CAPS: 500.0000 mg | ORAL_CAPSULE | Freq: Three times a day (TID) | ORAL | 0 refills | Status: AC

## 2022-11-03 MED ORDER — SODIUM CHLORIDE 0.9 % IV BOLUS
1000.0000 mL | Freq: Once | INTRAVENOUS | Status: AC
  Administered 2022-11-03: 1000 mL via INTRAVENOUS

## 2022-11-03 MED ORDER — SODIUM CHLORIDE 0.9 % IV SOLN
1.0000 g | Freq: Once | INTRAVENOUS | Status: AC
  Administered 2022-11-03: 1 g via INTRAVENOUS
  Filled 2022-11-03: qty 10

## 2022-11-03 MED ORDER — TRAMADOL HCL 50 MG PO TABS: 50.0000 mg | ORAL_TABLET | Freq: Three times a day (TID) | ORAL | 0 refills | Status: DC | PRN

## 2022-11-03 NOTE — ED Provider Notes (Signed)
[facility] Provider Note    Event Date/Time   First MD Initiated Contact with Patient 11/03/22 1709     (approximate)   History   Fever   HPI  Claudia Cannon is a 42 y.o. female who presents to the ER for evaluation of generalized malaise and myalgias as well as some chills.  Reportedly having fevers at home no cough or congestion having some right-sided abdominal pain in the right upper quadrant and flank.  No right lower quadrant tenderness to palpation.  No guarding or rebound.  No previous surgeries.     Physical Exam   Triage Vital Signs: ED Triage Vitals [11/03/22 1646]  Enc Vitals Group     BP 110/68     Pulse Rate (!) 112     Resp 18     Temp 98.2 F (36.8 C)     Temp Source Oral     SpO2 96 %     Weight      Height      Head Circumference      Peak Flow      Pain Score 0     Pain Loc      Pain Edu?      Excl. in GC?     Most recent vital signs: Vitals:   11/03/22 1646  BP: 110/68  Pulse: (!) 112  Resp: 18  Temp: 98.2 F (36.8 C)  SpO2: 96%     Constitutional: Alert  Eyes: Conjunctivae are normal.  Head: Atraumatic. Nose: No congestion/rhinnorhea. Mouth/Throat: Mucous membranes are moist.   Neck: Painless ROM.  Cardiovascular:   Good peripheral circulation. Respiratory: Normal respiratory effort.  No retractions.  Gastrointestinal: Soft with tenderness palpation of the right upper quadrant.  No tenderness to palpation the right lower quadrant.  No guarding or rebound. Musculoskeletal:  no deformity Neurologic:  MAE spontaneously. No gross focal neurologic deficits are appreciated.  Skin:  Skin is warm, dry and intact. No rash noted. Psychiatric: Mood and affect are normal. Speech and behavior are normal.    ED Results / Procedures / Treatments   Labs (all labs ordered are listed, but only abnormal results are displayed) Labs Reviewed  CBC WITH DIFFERENTIAL/PLATELET - Abnormal; Notable for the  following components:      Result Value   WBC 15.4 (*)    Neutro Abs 13.5 (*)    Abs Immature Granulocytes 0.11 (*)    All other components within normal limits  BASIC METABOLIC PANEL - Abnormal; Notable for the following components:   Potassium 3.4 (*)    Glucose, Bld 180 (*)    Calcium 8.5 (*)    All other components within normal limits  URINALYSIS, ROUTINE W REFLEX MICROSCOPIC - Abnormal; Notable for the following components:   Color, Urine AMBER (*)    APPearance CLOUDY (*)    Specific Gravity, Urine 1.033 (*)    Ketones, ur 20 (*)    Protein, ur 100 (*)    Leukocytes,Ua MODERATE (*)    Bacteria, UA FEW (*)    All other components within normal limits  HEPATIC FUNCTION PANEL - Abnormal; Notable for the following components:   Total Bilirubin 1.8 (*)    Bilirubin, Direct 0.3 (*)    Indirect Bilirubin 1.5 (*)    All other components within normal limits  SARS CORONAVIRUS 2 BY RT PCR  LIPASE, BLOOD     EKG     RADIOLOGY Please see ED Course for my  review and interpretation.  I personally reviewed all radiographic images ordered to evaluate for the above acute complaints and reviewed radiology reports and findings.  These findings were personally discussed with the patient.  Please see medical record for radiology report.   PROCEDURES:  Critical Care performed:   Procedures   MEDICATIONS ORDERED IN ED: Medications  cefTRIAXone (ROCEPHIN) 1 g in sodium chloride 0.9 % 100 mL IVPB (1 g Intravenous New Bag/Given 11/03/22 1854)  sodium chloride 0.9 % bolus 1,000 mL (1,000 mLs Intravenous New Bag/Given 11/03/22 1853)     IMPRESSION / MDM / ASSESSMENT AND PLAN / ED COURSE  I reviewed the triage vital signs and the nursing notes.                              Differential diagnosis includes, but is not limited to, cholelithiasis, cholecystitis, hepatitis, pna, dehydration, pyelonephritis nephritis, viral illness  Patient presenting to the ER for evaluation of  symptoms as described above.  Based on symptoms, risk factors and considered above differential, this presenting complaint could reflect a potentially life-threatening illness therefore the patient will be placed on continuous pulse oximetry and telemetry for monitoring.  Laboratory evaluation will be sent to evaluate for the above complaints.  Clinically with some right upper quadrant tenderness to palpation will order ultrasound to further evaluate.  Will give IV fluids.  A bit drowsy upon arrival after admittedly taking one of her mother's pain medications.  States that she will do this from time to time.  She is acting appropriately no report of head injury not consistent with meningitis or encephalitis.   Clinical Course as of 11/03/22 1913  Wynelle Link Nov 03, 2022  1732 X-ray my review and interpretation without evidence of consolidation. [PR]  1826 Urinalysis does appear consistent with cystitis.  Will give IV Rocephin given leukocytosis.  Still awaiting right upper quadrant ultrasound. [PR]  1912 Patient's ultrasound is reassuring.  Repeat abdominal exam is soft and benign.  On further review of her past medical records she seems to always have a leukocytosis.  Will cover with Keflex however given her presentation will give short course of pain medication.  Does appear appropriate for outpatient follow-up. [PR]    Clinical Course User Index [PR] Willy Eddy, MD     FINAL CLINICAL IMPRESSION(S) / ED DIAGNOSES   Final diagnoses:  Myalgia  Acute cystitis without hematuria     Rx / DC Orders   ED Discharge Orders          Ordered    cephALEXin (KEFLEX) 500 MG capsule  3 times daily        11/03/22 1909    traMADol (ULTRAM) 50 MG tablet  Every 8 hours PRN        11/03/22 1910             Note:  This document was prepared using Dragon voice recognition software and may include unintentional dictation errors.    Willy Eddy, MD 11/03/22 [phone]

## 2022-11-03 NOTE — ED Triage Notes (Signed)
Patient to ED via POV for fever and generalized body pain. States this episode started 2 days ago but been going on for over a year. Patient not waiting to answer questions and appears drowsy. Patient states that she took mother's pain medication to help with pain. Also, c/o nausea. Denies any pain at this time.

## 2023-01-18 ENCOUNTER — Other Ambulatory Visit: Payer: Self-pay

## 2023-01-18 ENCOUNTER — Emergency Department: Payer: Medicaid Other

## 2023-01-18 ENCOUNTER — Emergency Department
Admission: EM | Admit: 2023-01-18 | Discharge: 2023-01-18 | Disposition: A | Discharge status: Home / Self Care | Payer: Medicaid Other | Attending: Emergency Medicine | Admitting: Emergency Medicine

## 2023-01-18 DIAGNOSIS — R102 Pelvic and perineal pain: Secondary | ICD-10-CM | POA: Insufficient documentation

## 2023-01-18 DIAGNOSIS — R1032 Left lower quadrant pain: Secondary | ICD-10-CM | POA: Diagnosis not present

## 2023-01-18 DIAGNOSIS — E119 Type 2 diabetes mellitus without complications: Secondary | ICD-10-CM | POA: Diagnosis not present

## 2023-01-18 LAB — COMPREHENSIVE METABOLIC PANEL
ALT: 92 U/L — ABNORMAL HIGH (ref 0–44)
AST: 51 U/L — ABNORMAL HIGH (ref 15–41)
Albumin: 4 g/dL (ref 3.5–5.0)
Alkaline Phosphatase: 61 U/L (ref 38–126)
Anion gap: 11 (ref 5–15)
BUN: 10 mg/dL (ref 6–20)
CO2: 23 mmol/L (ref 22–32)
Calcium: 8.9 mg/dL (ref 8.9–10.3)
Chloride: 101 mmol/L (ref 98–111)
Creatinine, Ser: 0.5 mg/dL (ref 0.44–1.00)
GFR, Estimated: >60 mL/min (ref >60)
Glucose, Bld: 184 mg/dL — ABNORMAL HIGH (ref 70–99)
Potassium: 3.7 mmol/L (ref 3.5–5.1)
Sodium: 135 mmol/L (ref 135–145)
Total Bilirubin: 1.8 mg/dL — ABNORMAL HIGH (ref 0.3–1.2)
Total Protein: 7.5 g/dL (ref 6.5–8.1)

## 2023-01-18 LAB — URINALYSIS, W/ REFLEX TO CULTURE (INFECTION SUSPECTED)
Bacteria, UA: NONE SEEN
Bilirubin Urine: NEGATIVE
Glucose, UA: NEGATIVE mg/dL
Ketones, ur: NEGATIVE mg/dL
Nitrite: NEGATIVE
Protein, ur: 30 mg/dL — AB
Specific Gravity, Urine: 1.014 (ref 1.005–1.030)
pH: 5 (ref 5.0–8.0)

## 2023-01-18 LAB — CBC WITH DIFFERENTIAL/PLATELET
Abs Immature Granulocytes: 0.03 10*3/uL (ref 0.00–0.07)
Basophils Absolute: 0 10*3/uL (ref 0.0–0.1)
Basophils Relative: 0 %
Eosinophils Absolute: 0.1 10*3/uL (ref 0.0–0.5)
Eosinophils Relative: 1 %
HCT: 40.9 % (ref 36.0–46.0)
Hemoglobin: 14 g/dL (ref 12.0–15.0)
Immature Granulocytes: 0 %
Lymphocytes Relative: 16 %
Lymphs Abs: 2.2 10*3/uL (ref 0.7–4.0)
MCH: 29 pg (ref 26.0–34.0)
MCHC: 34.2 g/dL (ref 30.0–36.0)
MCV: 84.7 fL (ref 80.0–100.0)
Monocytes Absolute: 0.8 10*3/uL (ref 0.1–1.0)
Monocytes Relative: 6 %
Neutro Abs: 10.6 10*3/uL — ABNORMAL HIGH (ref 1.7–7.7)
Neutrophils Relative %: 77 %
Platelets: 383 10*3/uL (ref 150–400)
RBC: 4.83 MIL/uL (ref 3.87–5.11)
RDW: 13.1 % (ref 11.5–15.5)
WBC: 13.8 10*3/uL — ABNORMAL HIGH (ref 4.0–10.5)
nRBC: 0 % (ref 0.0–0.2)

## 2023-01-18 LAB — PREGNANCY, URINE: Preg Test, Ur: NEGATIVE

## 2023-01-18 LAB — LIPASE, BLOOD: Lipase: 28 U/L (ref 11–51)

## 2023-01-18 MED ORDER — SODIUM CHLORIDE 0.9 % IV BOLUS
1000.0000 mL | Freq: Once | INTRAVENOUS | Status: AC
  Administered 2023-01-18: 1000 mL via INTRAVENOUS

## 2023-01-18 MED ORDER — IOHEXOL 300 MG/ML  SOLN
100.0000 mL | Freq: Once | INTRAMUSCULAR | Status: AC | PRN
  Administered 2023-01-18: 100 mL via INTRAVENOUS

## 2023-01-18 MED ORDER — KETOROLAC TROMETHAMINE 30 MG/ML IJ SOLN
30.0000 mg | Freq: Once | INTRAMUSCULAR | Status: AC
  Administered 2023-01-18: 30 mg via INTRAVENOUS
  Filled 2023-01-18: qty 1

## 2023-01-18 NOTE — ED Triage Notes (Signed)
C/O left flank pain today. States had a recent UTI and treated with ?Keflex.  Pain and blood in urine started last night.

## 2023-01-18 NOTE — Discharge Instructions (Addendum)
Please use ibuprofen (Motrin) up to 800 mg every 8 hours, naproxen (Naprosyn) up to 500 mg every 12 hours, and/or acetaminophen (Tylenol) up to 4 g/day for any continued pain.  Please do not use this medication regimen for longer than 7 days

## 2023-01-18 NOTE — ED Provider Notes (Signed)
[facility] Provider Note   Event Date/Time   First MD Initiated Contact with Patient 01/18/23 [phone]     (approximate) History  Flank Pain  HPI Claudia Cannon is a 42 y.o. female with a stated past medical history of recurrent pancreatitis and type 2 diabetes who presents complaining of left lower quadrant abdominal pain that radiates to the left flank and she states is associated with similar symptoms to a urinary tract infection that she was treated with 1 week prior to arrival.  Patient states that this flank pain has become significantly worse and is associated with blood in her urine that began last night.  Patient states that this pain is intermittent and occasionally is worsened with certain positioning. ROS: Patient currently denies any vision changes, tinnitus, difficulty speaking, facial droop, sore throat, chest pain, shortness of breath, vomiting/diarrhea, dysuria, or weakness/numbness/paresthesias in any extremity   Physical Exam  Triage Vital Signs: ED Triage Vitals  Encounter Vitals Group     BP 01/18/23 0843 134/86     Systolic BP Percentile --      Diastolic BP Percentile --      Pulse Rate 01/18/23 0843 95     Resp 01/18/23 0843 16     Temp 01/18/23 0843 98.2 F (36.8 C)     Temp Source 01/18/23 0843 Oral     SpO2 01/18/23 0843 94 %     Weight 01/18/23 0844 167 lb 1.7 oz (75.8 kg)     Height 01/18/23 0844 5' (1.524 m)     Head Circumference --      Peak Flow --      Pain Score 01/18/23 0844 7     Pain Loc --      Pain Education --      Exclude from Growth Chart --    Most recent vital signs: Vitals:   01/18/23 0843  BP: 134/86  Pulse: 95  Resp: 16  Temp: 98.2 F (36.8 C)  SpO2: 94%   General: Awake, oriented x4. CV:  Good peripheral perfusion.  Resp:  Normal effort.  Abd:  No distention.  Other:  Middle-aged overweight Hispanic female laying in bed in no acute distress ED Results / Procedures / Treatments   Labs (all labs ordered are listed, but only abnormal results are displayed) Labs Reviewed  COMPREHENSIVE METABOLIC PANEL - Abnormal; Notable for the following components:      Result Value   Glucose, Bld 184 (*)    AST 51 (*)    ALT 92 (*)    Total Bilirubin 1.8 (*)    All other components within normal limits  CBC WITH DIFFERENTIAL/PLATELET - Abnormal; Notable for the following components:   WBC 13.8 (*)    Neutro Abs 10.6 (*)    All other components within normal limits  URINALYSIS, W/ REFLEX TO CULTURE (INFECTION SUSPECTED) - Abnormal; Notable for the following components:   Color, Urine YELLOW (*)    APPearance HAZY (*)    Hgb urine dipstick MODERATE (*)    Protein, ur 30 (*)    Leukocytes,Ua SMALL (*)    All other components within normal limits  URINE CULTURE  LIPASE, BLOOD  PREGNANCY, URINE   RADIOLOGY ED MD interpretation: CT of the abdomen and pelvis with IV contrast interpreted independently by me and shows no acute findings in the abdomen or pelvis.  There is incidentally found portion of the left broad ligament/uterine vasculature that extends into the left inguinal canal -  Agree with radiology assessment Official radiology report(s): CT ABDOMEN PELVIS W CONTRAST  Result Date: 01/18/2023 CLINICAL DATA:  LEFT lower quadrant abdominal pain. Recent urinary tract infection. EXAM: CT ABDOMEN AND PELVIS WITH CONTRAST TECHNIQUE: Multidetector CT imaging of the abdomen and pelvis was performed using the standard protocol following bolus administration of intravenous contrast. RADIATION DOSE REDUCTION: This exam was performed according to the departmental dose-optimization program which includes automated exposure control, adjustment of the mA and/or kV according to patient size and/or use of iterative reconstruction technique. CONTRAST:  OMNIPAQUE IOHEXOL 300 MG/ML  SOLN COMPARISON:  09/20/2017 CT FINDINGS: Lower chest: Lung bases are clear. Hepatobiliary: No focal hepatic  lesion. Normal gallbladder. No biliary duct dilatation. Common bile duct is normal. Pancreas: Pancreas is normal. No ductal dilatation. No pancreatic inflammation. Spleen: Normal spleen Adrenals/urinary tract: Adrenal glands are normal. No enhancing renal cortical lesion. Ureters and bladder normal Stomach/Bowel: Stomach, small bowel, appendix, and cecum are normal. The colon and rectosigmoid colon are normal. Vascular/Lymphatic: Abdominal aorta is normal caliber. No periportal or retroperitoneal adenopathy. No pelvic adenopathy. Reproductive: Uterus normal. Portion of the LEFT broad ligament/uterine vasculature extends into the LEFT inguinal canal (image 80/2) this configuration is not changed from comparison CT 09/20/2017. Findings also present on MRI 09/11/2021. Normal ovaries. Other: No free fluid. Musculoskeletal: No aggressive osseous lesion. IMPRESSION: 1. No acute findings in the abdomen pelvis. 2. Normal appendix. 3. Normal kidneys ureters and bladder. 4. Portion of the LEFT broad ligament/uterine vasculature extends into the LEFT inguinal canal. This configuration is not changed from comparison CT 09/20/2017. Normal ovaries. Electronically Signed   By: Genevive Bi M.D.   On: 01/18/2023 10:42   PROCEDURES: Critical Care performed: No .1-3 Lead EKG Interpretation  Performed by: Merwyn Katos, MD Authorized by: Merwyn Katos, MD     Interpretation: normal     ECG rate:  71   ECG rate assessment: normal     Rhythm: sinus rhythm     Ectopy: none     Conduction: normal    MEDICATIONS ORDERED IN ED: Medications  sodium chloride 0.9 % bolus 1,000 mL (1,000 mLs Intravenous New Bag/Given 01/18/23 1026)  ketorolac (TORADOL) 30 MG/ML injection 30 mg (30 mg Intravenous Given 01/18/23 1026)  iohexol (OMNIPAQUE) 300 MG/ML solution 100 mL (100 mLs Intravenous Contrast Given 01/18/23 1015)   IMPRESSION / MDM / ASSESSMENT AND PLAN / ED COURSE  I reviewed the triage vital signs and the nursing  notes.                             The patient is on the cardiac monitor to evaluate for evidence of arrhythmia and/or significant heart rate changes. Patient's presentation is most consistent with acute presentation with potential threat to life or bodily function. Patients symptoms not typical for emergent causes of abdominal pain such as, but not limited to, appendicitis, abdominal aortic aneurysm, surgical biliary disease, pancreatitis, SBO, mesenteric ischemia, serious intra-abdominal bacterial illness. Presentation also not typical of gynecologic emergencies such as TOA, Ovarian Torsion, PID. Not Ectopic. Doubt atypical ACS.  Pt tolerating PO. Disposition: Patient will be discharged with strict return precautions and follow up with primary MD within 12-24 hours for further evaluation. Patient understands that this still may have an early presentation of an emergent medical condition such as appendicitis that will require a recheck.   FINAL CLINICAL IMPRESSION(S) / ED DIAGNOSES   Final diagnoses:  Broad ligament pain  Acute left lower quadrant pain   Rx / DC Orders   ED Discharge Orders     None      Note:  This document was prepared using Dragon voice recognition software and may include unintentional dictation errors.   Merwyn Katos, MD 01/18/23 [phone]

## 2023-01-19 LAB — URINE CULTURE: Culture: NO GROWTH

## 2023-01-30 ENCOUNTER — Ambulatory Visit: Payer: Self-pay | Admitting: General Surgery

## 2023-01-30 NOTE — H&P (Signed)
PATIENT PROFILE: Claudia Cannon is a 42 y.o. female who presents to the Clinic for consultation at the request of Andrey Campanile CNM for evaluation of inguinal hernia.  PCP:  [facility]  HISTORY OF PRESENT ILLNESS: Ms. Claudia Cannon reports she has been having left-sided abdominal pain for the last few months.  She understand the pain is localized to the left lateral abdominal wall.  Pain does not radiate to other part of body.  Pain is aggravated by physical activities.  No alleviating factors.  She denies a bulge in the groin area.  Patient was pregnant and had her baby in January 2024.  As per patient she had difficulty getting pregnant.  She has had had surgery in the past for reconstruction of her fallopian tubes.  She has also had surgery for ectopic pregnancy.  Denies any episode of abdominal distention, nausea or vomiting.  The patient had a CT scan of the abdomen and pelvis that showed small inguinal hernia with broad ligament going through the canal.  I personally evaluated the images.  This finding was also seen on a previous CT scan 2019.   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   Hypertriglyceridemia 09/20/2017   Acute pancreatitis (HHS-HCC) 09/20/2017   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  Positive for abdominal pain Musculoskeletal ROS: negative for - joint swelling or muscle pain Neurological ROS: negative for - confusion, syncope Dermatological ROS: negative for pruritus and rash Psychiatric: negative for anxiety, depression, difficulty sleeping and memory loss  MEDICATIONS: Current Outpatient Medications  Medication Sig Dispense Refill   metFORMIN (GLUCOPHAGE-XR) 500 MG XR tablet Take 500 mg by mouth 2 (two) times daily     nitrofurantoin, macrocrystal-monohydrate, (MACROBID) 100 MG capsule Take 1 capsule (100 mg total) by mouth 2 (two) times daily for 7 days 14 capsule 0   norethindrone-ethinyl estradiol (JUNEL 1/20) 1-20 mg-mcg tablet Take 1 tablet by mouth once daily Take daily for 21 days, no drug for 7 days, then start new pack. 84 tablet 3   ACCU-CHEK AVIVA PLUS TEST STRP test strip  (Patient not taking: Reported on 01/22/2023)     insulin NPH (HUMULIN N NPH INSULIN KWIKPEN) pen injector (concentration 100 units/mL) Inject 12 Units subcutaneously every evening (Patient not taking: Reported on 01/22/2023)     M-NATAL PLUS tablet  (Patient not taking: Reported on 01/22/2023)     pen needle, diabetic 31 gauge x 3/16" needle Use as directed (Patient not taking: Reported on  01/22/2023) 100 each 12   PNV no.95/ferrous fum/folic ac (PRENATAL ORAL) Take by mouth (Patient not taking: Reported on 01/22/2023)     traMADoL (ULTRAM) 50 mg tablet Take 50 mg by mouth every 8 (eight) hours as needed (Patient not taking: Reported on 01/22/2023)     No current facility-administered  medications for this visit.    ALLERGIES: Patient has no known allergies.  PAST MEDICAL HISTORY: Past Medical History:  Diagnosis Date   Diabetes (CMS/HHS-HCC)    History of motion sickness    When riding in the back of the car   PONV (postoperative nausea and vomiting)    Nausea only    PAST SURGICAL HISTORY: Past Surgical History:  Procedure Laterality Date   Tubal sterilization  2012   REPAIR FALLOPIAN TUBE N/A 07/06/2018   Procedure: TUBOTUBAL ANASTOMOSIS;  Surgeon: Jennye Moccasin, MD;  Location: [facility] OR;  Service: Gynecology;  Laterality: N/A;   LAPAROSCOPIC REMOVAL ECTOPIC PREGNANCY N/A 09/21/2020   Procedure: DIAGNOSTIC LAPAROSCOPY; LAPAROSCOPIC TREATMENT OF ECTOPIC PREGNANCY; RIGHT SALPINGECTOMY;  Surgeon: Cammie Sickle, MD;  Location: [facility] OR;  Service: Gynecology;  Laterality: N/A;     FAMILY HISTORY: Family History  Problem Relation Name Age of Onset   Diabetes type II Mother     High blood pressure (Hypertension) Mother     Chronic kidney disease Mother     Anesthesia problems Neg Hx       SOCIAL HISTORY: Social History   Socioeconomic History   Marital status: Married  Occupational History   Occupation: Roofing business  Tobacco Use   Smoking status: Never   Smokeless tobacco: Never  Vaping Use   Vaping status: Never Used  Substance and Sexual Activity   Alcohol use: No   Drug use: No   Sexual activity: Yes    Partners: Male    Birth control/protection: None   Social Determinants of Health   Food Insecurity: No Food Insecurity (07/02/2022)   Received from [facility], [place]   Hunger Vital Sign    Worried About Running Out of Food in the Last Year: Never true    Ran Out of Food in the Last Year: Never true  Transportation Needs: No Transportation Needs (07/02/2022)   Received from [facility], [place]   PRAPARE - Transportation    Lack of Transportation (Medical): No    Lack of Transportation (Non-Medical): No     PHYSICAL EXAM: Vitals:   01/30/23 1545  BP: (!) 143/92  Pulse: 105   Body mass index is 30.86 kg/m. Weight: 71.7 kg (158 lb)   GENERAL: Alert, active, oriented x3  HEENT: Pupils equal reactive to light. Extraocular movements are intact. Sclera clear. Palpebral conjunctiva normal red color.Pharynx clear.  NECK: Supple with no palpable mass and no adenopathy.  LUNGS: Sound clear with no rales rhonchi or wheezes.  HEART: Regular rhythm S1 and S2 without murmur.  ABDOMEN: Soft and depressible, nontender with no palpable mass, no hepatomegaly.  No bulging was felt on physical exam.  EXTREMITIES: Well-developed well-nourished symmetrical with no dependent edema.  NEUROLOGICAL: Awake alert oriented, facial expression symmetrical, moving all extremities.  REVIEW OF DATA: I have reviewed the following data today: Office Visit on 01/22/2023  Component Date Value   Color 01/22/2023 Light Yellow    Clarity 01/22/2023 Cloudy (!)    Specific Gravity 01/22/2023 1.023    pH, Urine 01/22/2023 5.5    Protein, Urinalysis 01/22/2023 1+ (!)    Glucose, Urinalysis 01/22/2023 3+ Marland Kitchen)  Ketones, Urinalysis 01/22/2023 1+ (!)    Blood, Urinalysis 01/22/2023 Trace (!)    Nitrite, Urinalysis 01/22/2023 Positive (!)    Leukocyte Esterase, Urin* 01/22/2023 3+ (!)    Bilirubin, Urinalysis 01/22/2023 Negative    Urobilinogen, Urinalysis 01/22/2023 0.2    Mucous, Urine 01/22/2023 PRESENT (!)    WBC, UA 01/22/2023 117 (H)    Red Blood Cells, Urinaly* 01/22/2023 6 (H)    Bacteria, Urinalysis 01/22/2023 5-50 (!)    Squamous Epithelial Cell* 01/22/2023 0    Calcium Oxalate Crystals 01/22/2023 PRESENT (!)    Urine Culture, Routine -* 01/22/2023 Final report (!)    Result 1 - [facility] 01/22/2023 Escherichia coli (!)    Result 2 - [facility] 01/22/2023 Not applicable    Antimicrobial Susceptibi* 01/22/2023 Comment   Appointment on 12/03/2022  Component Date Value   Sedimentation Rate-Autom*  12/03/2022 14    C Reactive Protein - Lab* 12/03/2022 3   Office Visit on 11/07/2022  Component Date Value   Sedimentation Rate-Autom* 11/07/2022 84 (H)    C Reactive Protein - Lab* 11/07/2022 115 (H)    WBC (White Blood Cell Co* 11/07/2022 6.3    RBC (Red Blood Cell Coun* 11/07/2022 4.28    Hemoglobin 11/07/2022 12.3    Hematocrit 11/07/2022 36.4    MCV (Mean Corpuscular Vo* 11/07/2022 85.0    MCH (Mean Corpuscular He* 11/07/2022 28.7    MCHC (Mean Corpuscular H* 11/07/2022 33.8    Platelet Count 11/07/2022 396    RDW-CV (Red Cell Distrib* 11/07/2022 12.9    MPV (Mean Platelet Volum* 11/07/2022 8.7 (L)    Neutrophils 11/07/2022 3.02    Lymphocytes 11/07/2022 2.32    Monocytes 11/07/2022 0.82    Eosinophils 11/07/2022 0.05    Basophils 11/07/2022 0.03    Neutrophil % 11/07/2022 47.6    Lymphocyte % 11/07/2022 36.6    Monocyte % 11/07/2022 12.9    Eosinophil % 11/07/2022 0.8 (L)    Basophil% 11/07/2022 0.5    Immature Granulocyte % 11/07/2022 1.6 (H)    Immature Granulocyte Cou* 11/07/2022 0.10 (H)    Color 11/07/2022 Light Yellow    Clarity 11/07/2022 Clear    Specific Gravity 11/07/2022 1.015    pH, Urine 11/07/2022 6.5    Protein, Urinalysis 11/07/2022 Trace (!)    Glucose, Urinalysis 11/07/2022 Negative    Ketones, Urinalysis 11/07/2022 Negative    Blood, Urinalysis 11/07/2022 Negative    Nitrite, Urinalysis 11/07/2022 Negative    Leukocyte Esterase, Urin* 11/07/2022 Negative    Bilirubin, Urinalysis 11/07/2022 Negative    Urobilinogen, Urinalysis 11/07/2022 0.2    WBC, UA 11/07/2022 4    Red Blood Cells, Urinaly* 11/07/2022 1    Bacteria, Urinalysis 11/07/2022 0-5    Squamous Epithelial Cell* 11/07/2022 2      ASSESSMENT: Ms. NATASH FARLESS is a 42 y.o. female presenting for consultation for left versus bilateral inguinal hernia.    The patient presents with a left versus bilateral inguinal hernia. Patient was oriented about the diagnosis of inguinal hernia and  its implication. The patient was oriented about the treatment alternatives (observation vs surgical repair). Due to patient symptoms, repair is recommended. Patient oriented about the surgical procedure, the use of mesh and its risk of complications such as: infection, bleeding, injury to vas deference, vasculature and testicle, injury to bowel or bladder, and chronic pain.   I had a long discussion with the patient that I was not 100% sure that her left lateral abdominal wall pain was due to  the hernia seen on CT scan.  This hernia has been seen since 2019 and this pain is new.  The pain is also far away from the left groin.  I think that the fact that patient is a female with left groin pain and hernia it is reasonable to proceed with repair of hernia.  Females are high risk of incarceration.  Even if the pain does not resolve I think that patient will benefit of having the hernia repair but again I was very clear that she might continue with the pain if it is not caused by her hernia.  If the pain does not improve after hernia repair I will recommend physical therapy.  Patient reports that she understand this rationale and still wants to proceed with left versus bilateral inguinal hernia repair.  Non-recurrent unilateral inguinal hernia without obstruction or gangrene [K40.90]  PLAN: 1.  Robotic assisted laparoscopic left vs bilateral inguinal hernia repair with mesh ([id number]) 2.  Avoid taking aspirin 5 days before procedure 3.  Contact us if has any question or concern.   Patient verbalized understanding, all questions were answered, and were agreeable with the plan outlined above.

## 2023-01-30 NOTE — H&P (View-Only) (Signed)
 PATIENT PROFILE: Claudia Cannon is a 42 y.o. female who presents to the Clinic for consultation at the request of Andrey Campanile CNM for evaluation of inguinal hernia.  PCP:  [facility], [facility]  HISTORY OF PRESENT ILLNESS: Ms. Claudia Cannon reports she has been having left-sided abdominal pain for the last few months.  She understand the pain is localized to the left lateral abdominal wall.  Pain does not radiate to other part of body.  Pain is aggravated by physical activities.  No alleviating factors.  She denies a bulge in the groin area.  Patient was pregnant and had her baby in January 2024.  As per patient she had difficulty getting pregnant.  She has had had surgery in the past for reconstruction of her fallopian tubes.  She has also had surgery for ectopic pregnancy.  Denies any episode of abdominal distention, nausea or vomiting.  The patient had a CT scan of the abdomen and pelvis that showed small inguinal hernia with broad ligament going through the canal.  I personally evaluated the images.  This finding was also seen on a previous CT scan 2019.   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   Hypertriglyceridemia 09/20/2017   Acute pancreatitis (HHS-HCC) 09/20/2017   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  Positive for abdominal pain Musculoskeletal ROS: negative for - joint swelling or muscle pain Neurological ROS: negative for - confusion, syncope Dermatological ROS: negative for pruritus and rash Psychiatric: negative for anxiety, depression, difficulty sleeping and memory loss  MEDICATIONS: Current Outpatient Medications  Medication Sig Dispense Refill   metFORMIN (GLUCOPHAGE-XR) 500 MG XR tablet Take 500 mg by mouth 2 (two) times daily     nitrofurantoin, macrocrystal-monohydrate, (MACROBID) 100 MG capsule Take 1 capsule (100 mg total) by mouth 2 (two) times daily for 7 days 14 capsule 0   norethindrone-ethinyl estradiol (JUNEL 1/20) 1-20 mg-mcg tablet Take 1 tablet by mouth once daily Take daily for 21 days, no drug for 7 days, then start new pack. 84 tablet 3   ACCU-CHEK AVIVA PLUS TEST STRP test strip  (Patient not taking: Reported on 01/22/2023)     insulin NPH (HUMULIN N NPH INSULIN KWIKPEN) pen injector (concentration 100 units/mL) Inject 12 Units subcutaneously every evening (Patient not taking: Reported on 01/22/2023)     M-NATAL PLUS tablet  (Patient not taking: Reported on 01/22/2023)     pen needle, diabetic 31 gauge x 3/16" needle Use as directed (Patient not taking: Reported on  01/22/2023) 100 each 12   PNV no.95/ferrous fum/folic ac (PRENATAL ORAL) Take by mouth (Patient not taking: Reported on 01/22/2023)     traMADoL (ULTRAM) 50 mg tablet Take 50 mg by mouth every 8 (eight) hours as needed (Patient not taking: Reported on 01/22/2023)     No current facility-administered  medications for this visit.    ALLERGIES: Patient has no known allergies.  PAST MEDICAL HISTORY: Past Medical History:  Diagnosis Date   Diabetes (CMS/HHS-HCC)    History of motion sickness    When riding in the back of the car   PONV (postoperative nausea and vomiting)    Nausea only    PAST SURGICAL HISTORY: Past Surgical History:  Procedure Laterality Date   Tubal sterilization  2012   REPAIR FALLOPIAN TUBE N/A 07/06/2018   Procedure: TUBOTUBAL ANASTOMOSIS;  Surgeon: Jennye Moccasin, MD;  Location: [facility] OR;  Service: Gynecology;  Laterality: N/A;   LAPAROSCOPIC REMOVAL ECTOPIC PREGNANCY N/A 09/21/2020   Procedure: DIAGNOSTIC LAPAROSCOPY; LAPAROSCOPIC TREATMENT OF ECTOPIC PREGNANCY; RIGHT SALPINGECTOMY;  Surgeon: Cammie Sickle, MD;  Location: [facility] OR;  Service: Gynecology;  Laterality: N/A;     FAMILY HISTORY: Family History  Problem Relation Name Age of Onset   Diabetes type II Mother     High blood pressure (Hypertension) Mother     Chronic kidney disease Mother     Anesthesia problems Neg Hx       SOCIAL HISTORY: Social History   Socioeconomic History   Marital status: Married  Occupational History   Occupation: Roofing business  Tobacco Use   Smoking status: Never   Smokeless tobacco: Never  Vaping Use   Vaping status: Never Used  Substance and Sexual Activity   Alcohol use: No   Drug use: No   Sexual activity: Yes    Partners: Male    Birth control/protection: None   Social Determinants of Health   Food Insecurity: No Food Insecurity (07/02/2022)   Received from [facility], [place]   Hunger Vital Sign    Worried About Running Out of Food in the Last Year: Never true    Ran Out of Food in the Last Year: Never true  Transportation Needs: No Transportation Needs (07/02/2022)   Received from [facility], [place]   PRAPARE - Transportation    Lack of Transportation (Medical): No    Lack of Transportation (Non-Medical): No     PHYSICAL EXAM: Vitals:   01/30/23 1545  BP: (!) 143/92  Pulse: 105   Body mass index is 30.86 kg/m. Weight: 71.7 kg (158 lb)   GENERAL: Alert, active, oriented x3  HEENT: Pupils equal reactive to light. Extraocular movements are intact. Sclera clear. Palpebral conjunctiva normal red color.Pharynx clear.  NECK: Supple with no palpable mass and no adenopathy.  LUNGS: Sound clear with no rales rhonchi or wheezes.  HEART: Regular rhythm S1 and S2 without murmur.  ABDOMEN: Soft and depressible, nontender with no palpable mass, no hepatomegaly.  No bulging was felt on physical exam.  EXTREMITIES: Well-developed well-nourished symmetrical with no dependent edema.  NEUROLOGICAL: Awake alert oriented, facial expression symmetrical, moving all extremities.  REVIEW OF DATA: I have reviewed the following data today: Office Visit on 01/22/2023  Component Date Value   Color 01/22/2023 Light Yellow    Clarity 01/22/2023 Cloudy (!)    Specific Gravity 01/22/2023 1.023    pH, Urine 01/22/2023 5.5    Protein, Urinalysis 01/22/2023 1+ (!)    Glucose, Urinalysis 01/22/2023 3+ Marland Kitchen)  Ketones, Urinalysis 01/22/2023 1+ (!)    Blood, Urinalysis 01/22/2023 Trace (!)    Nitrite, Urinalysis 01/22/2023 Positive (!)    Leukocyte Esterase, Urin* 01/22/2023 3+ (!)    Bilirubin, Urinalysis 01/22/2023 Negative    Urobilinogen, Urinalysis 01/22/2023 0.2    Mucous, Urine 01/22/2023 PRESENT (!)    WBC, UA 01/22/2023 117 (H)    Red Blood Cells, Urinaly* 01/22/2023 6 (H)    Bacteria, Urinalysis 01/22/2023 5-50 (!)    Squamous Epithelial Cell* 01/22/2023 0    Calcium Oxalate Crystals 01/22/2023 PRESENT (!)    Urine Culture, Routine -* 01/22/2023 Final report (!)    Result 1 - [facility] 01/22/2023 Escherichia coli (!)    Result 2 - [facility] 01/22/2023 Not applicable    Antimicrobial Susceptibi* 01/22/2023 Comment   Appointment on 12/03/2022  Component Date Value   Sedimentation Rate-Autom*  12/03/2022 14    C Reactive Protein - Lab* 12/03/2022 3   Office Visit on 11/07/2022  Component Date Value   Sedimentation Rate-Autom* 11/07/2022 84 (H)    C Reactive Protein - Lab* 11/07/2022 115 (H)    WBC (White Blood Cell Co* 11/07/2022 6.3    RBC (Red Blood Cell Coun* 11/07/2022 4.28    Hemoglobin 11/07/2022 12.3    Hematocrit 11/07/2022 36.4    MCV (Mean Corpuscular Vo* 11/07/2022 85.0    MCH (Mean Corpuscular He* 11/07/2022 28.7    MCHC (Mean Corpuscular H* 11/07/2022 33.8    Platelet Count 11/07/2022 396    RDW-CV (Red Cell Distrib* 11/07/2022 12.9    MPV (Mean Platelet Volum* 11/07/2022 8.7 (L)    Neutrophils 11/07/2022 3.02    Lymphocytes 11/07/2022 2.32    Monocytes 11/07/2022 0.82    Eosinophils 11/07/2022 0.05    Basophils 11/07/2022 0.03    Neutrophil % 11/07/2022 47.6    Lymphocyte % 11/07/2022 36.6    Monocyte % 11/07/2022 12.9    Eosinophil % 11/07/2022 0.8 (L)    Basophil% 11/07/2022 0.5    Immature Granulocyte % 11/07/2022 1.6 (H)    Immature Granulocyte Cou* 11/07/2022 0.10 (H)    Color 11/07/2022 Light Yellow    Clarity 11/07/2022 Clear    Specific Gravity 11/07/2022 1.015    pH, Urine 11/07/2022 6.5    Protein, Urinalysis 11/07/2022 Trace (!)    Glucose, Urinalysis 11/07/2022 Negative    Ketones, Urinalysis 11/07/2022 Negative    Blood, Urinalysis 11/07/2022 Negative    Nitrite, Urinalysis 11/07/2022 Negative    Leukocyte Esterase, Urin* 11/07/2022 Negative    Bilirubin, Urinalysis 11/07/2022 Negative    Urobilinogen, Urinalysis 11/07/2022 0.2    WBC, UA 11/07/2022 4    Red Blood Cells, Urinaly* 11/07/2022 1    Bacteria, Urinalysis 11/07/2022 0-5    Squamous Epithelial Cell* 11/07/2022 2      ASSESSMENT: Ms. NATASH FARLESS is a 42 y.o. female presenting for consultation for left versus bilateral inguinal hernia.    The patient presents with a left versus bilateral inguinal hernia. Patient was oriented about the diagnosis of inguinal hernia and  its implication. The patient was oriented about the treatment alternatives (observation vs surgical repair). Due to patient symptoms, repair is recommended. Patient oriented about the surgical procedure, the use of mesh and its risk of complications such as: infection, bleeding, injury to vas deference, vasculature and testicle, injury to bowel or bladder, and chronic pain.   I had a long discussion with the patient that I was not 100% sure that her left lateral abdominal wall pain was due to  the hernia seen on CT scan.  This hernia has been seen since 2019 and this pain is new.  The pain is also far away from the left groin.  I think that the fact that patient is a female with left groin pain and hernia it is reasonable to proceed with repair of hernia.  Females are high risk of incarceration.  Even if the pain does not resolve I think that patient will benefit of having the hernia repair but again I was very clear that she might continue with the pain if it is not caused by her hernia.  If the pain does not improve after hernia repair I will recommend physical therapy.  Patient reports that she understand this rationale and still wants to proceed with left versus bilateral inguinal hernia repair.  Non-recurrent unilateral inguinal hernia without obstruction or gangrene [K40.90]  PLAN: 1.  Robotic assisted laparoscopic left vs bilateral inguinal hernia repair with mesh ([id number]) 2.  Avoid taking aspirin 5 days before procedure 3.  Contact us if has any question or concern.   Patient verbalized understanding, all questions were answered, and were agreeable with the plan outlined above.

## 2023-02-04 ENCOUNTER — Encounter
Admission: RE | Admit: 2023-02-04 | Discharge: 2023-02-04 | Disposition: A | Discharge status: Home / Self Care | Payer: Medicaid Other | Source: Ambulatory Visit | Attending: General Surgery | Admitting: General Surgery

## 2023-02-04 ENCOUNTER — Encounter: Payer: Self-pay | Admitting: General Surgery

## 2023-02-04 VITALS — Ht 60.0 in | Wt 158.0 lb

## 2023-02-04 DIAGNOSIS — K4090 Unilateral inguinal hernia, without obstruction or gangrene, not specified as recurrent: Secondary | ICD-10-CM

## 2023-02-04 DIAGNOSIS — Z01812 Encounter for preprocedural laboratory examination: Secondary | ICD-10-CM

## 2023-02-04 HISTORY — DX: Pure hyperglyceridemia: E78.1

## 2023-02-04 HISTORY — DX: Tubulo-interstitial nephritis, not specified as acute or chronic: N12

## 2023-02-04 HISTORY — DX: Acute cystitis with hematuria: N30.01

## 2023-02-04 HISTORY — DX: Other specified postprocedural states: Z98.890

## 2023-02-04 NOTE — Patient Instructions (Addendum)
Your procedure is scheduled on: 02/07/2023 Friday  Report to the Registration Desk on the 1st floor of the [facility]. To find out your arrival time, please call [phone] between 1PM - 3PM on: 02/06/2023 Thursday  If your arrival time is 6:00 am, do not arrive before that time as the [facility] entrance doors do not open until 6:00 am.  REMEMBER: Instructions that are not followed completely may result in serious medical risk, up to and including death; or upon the discretion of your surgeon and anesthesiologist your surgery may need to be rescheduled.  Do not eat food after midnight the night before surgery.  No gum chewing or hard candies.  One week prior to surgery: Stop Anti-inflammatories (NSAIDS) such as Advil, Aleve, Ibuprofen, Motrin, Naproxen, Naprosyn and Aspirin based products such as Excedrin, Goody's Powder, BC Powder. Stop ANY OVER THE COUNTER supplements until after surgery. You may however, continue to take Tylenol if needed for pain up until the day of surgery.  Continue taking all prescribed medications with the exception of the following:    Metformin - do not take it two days prior to surgery   No Alcohol for 24 hours before or after surgery.  No Smoking including e-cigarettes for 24 hours before surgery.  No chewable tobacco products for at least 6 hours before surgery.  No nicotine patches on the day of surgery.  Do not use any "recreational" drugs for at least a week (preferably 2 weeks) before your surgery.  Please be advised that the combination of cocaine and anesthesia may have negative outcomes, up to and including death. If you test positive for cocaine, your surgery will be cancelled.  On the morning of surgery brush your teeth with toothpaste and water, you may rinse your mouth with mouthwash if you wish. Do not swallow any toothpaste or mouthwash.  Use CHG Soap as directed on instruction sheet.  Do not wear jewelry, make-up, hairpins, clips  or nail polish.  Do not wear lotions, powders, or perfumes.   Do not shave body hair from the neck down 48 hours before surgery.  Contact lenses, hearing aids and dentures may not be worn into surgery.  Do not bring valuables to the hospital. [facility] is not responsible for any missing/lost belongings or valuables.    Notify your doctor if there is any change in your medical condition (cold, fever, infection).  Wear comfortable clothing (specific to your surgery type) to the hospital.  After surgery, you can help prevent lung complications by doing breathing exercises.  Take deep breaths and cough every 1-2 hours. Your doctor may order a device called an Incentive Spirometer to help you take deep breaths.  If you are being admitted to the hospital overnight, leave your suitcase in the car. After surgery it may be brought to your room.  In case of increased patient census, it may be necessary for you, the patient, to continue your postoperative care in the Same Day Surgery department.  If you are being discharged the day of surgery, you will not be allowed to drive home. You will need a responsible individual to drive you home and stay with you for 24 hours after surgery.   Please call the Pre-admissions Testing Dept. at [phone] if you have any questions about these instructions.  Surgery Visitation Policy:  Patients having surgery or a procedure may have two visitors.  Children under the age of 82 must have an adult with them who is not  the patient.      Preparing for Surgery with CHLORHEXIDINE GLUCONATE (CHG) Soap  Chlorhexidine Gluconate (CHG) Soap  o An antiseptic cleaner that kills germs and bonds with the skin to continue killing germs even after washing  o Used for showering the night before surgery and morning of surgery  Before surgery, you can play an important role by reducing the number of germs on your skin.  CHG (Chlorhexidine gluconate) soap is an  antiseptic cleanser which kills germs and bonds with the skin to continue killing germs even after washing.  Please do not use if you have an allergy to CHG or antibacterial soaps. If your skin becomes reddened/irritated stop using the CHG.  1. Shower the NIGHT BEFORE SURGERY and the MORNING OF SURGERY with CHG soap.  2. If you choose to wash your hair, wash your hair first as usual with your normal shampoo.  3. After shampooing, rinse your hair and body thoroughly to remove the shampoo.  4. Use CHG as you would any other liquid soap. You can apply CHG directly to the skin and wash gently with a scrungie or a clean washcloth.  5. Apply the CHG soap to your body only from the neck down. Do not use on open wounds or open sores. Avoid contact with your eyes, ears, mouth, and genitals (private parts). Wash face and genitals (private parts) with your normal soap.  6. Wash thoroughly, paying special attention to the area where your surgery will be performed.  7. Thoroughly rinse your body with warm water.  8. Do not shower/wash with your normal soap after using and rinsing off the CHG soap.  9. Pat yourself dry with a clean towel.  10. Wear clean pajamas to bed the night before surgery.  12. Place clean sheets on your bed the night of your first shower and do not sleep with pets.  13. Shower again with the CHG soap on the day of surgery prior to arriving at the hospital.  14. Do not apply any deodorants/lotions/powders.  15. Please wear clean clothes to the hospital.

## 2023-02-06 MED ORDER — CHLORHEXIDINE GLUCONATE 0.12 % MT SOLN
15.0000 mL | Freq: Once | OROMUCOSAL | Status: AC
  Administered 2023-02-07: 15 mL via OROMUCOSAL

## 2023-02-06 MED ORDER — FAMOTIDINE 20 MG PO TABS
20.0000 mg | ORAL_TABLET | Freq: Once | ORAL | Status: AC
  Administered 2023-02-07: 20 mg via ORAL

## 2023-02-06 MED ORDER — SODIUM CHLORIDE 0.9 % IV SOLN: INTRAVENOUS | Status: DC

## 2023-02-06 MED ORDER — CEFAZOLIN SODIUM-DEXTROSE 2-4 GM/100ML-% IV SOLN
2.0000 g | INTRAVENOUS | Status: AC
  Administered 2023-02-07: 2 g via INTRAVENOUS

## 2023-02-06 MED ORDER — ORAL CARE MOUTH RINSE: 15.0000 mL | Freq: Once | OROMUCOSAL | Status: AC

## 2023-02-07 ENCOUNTER — Ambulatory Visit: Payer: Medicaid Other | Admitting: Anesthesiology

## 2023-02-07 ENCOUNTER — Encounter: Payer: Self-pay | Admitting: General Surgery

## 2023-02-07 ENCOUNTER — Other Ambulatory Visit: Payer: Self-pay

## 2023-02-07 ENCOUNTER — Ambulatory Visit
Admission: RE | Admit: 2023-02-07 | Discharge: 2023-02-07 | Disposition: A | Discharge status: Home / Self Care | Payer: Medicaid Other | Source: Ambulatory Visit | Attending: General Surgery | Admitting: General Surgery

## 2023-02-07 ENCOUNTER — Encounter
Admission: RE | Disposition: A | Discharge status: Home / Self Care | Payer: Self-pay | Source: Ambulatory Visit | Attending: General Surgery

## 2023-02-07 DIAGNOSIS — K4090 Unilateral inguinal hernia, without obstruction or gangrene, not specified as recurrent: Secondary | ICD-10-CM | POA: Diagnosis not present

## 2023-02-07 DIAGNOSIS — Z01812 Encounter for preprocedural laboratory examination: Secondary | ICD-10-CM

## 2023-02-07 DIAGNOSIS — E119 Type 2 diabetes mellitus without complications: Secondary | ICD-10-CM | POA: Diagnosis not present

## 2023-02-07 HISTORY — PX: XI ROBOTIC ASSISTED INGUINAL HERNIA REPAIR WITH MESH: SHX6706

## 2023-02-07 HISTORY — DX: Unilateral inguinal hernia, without obstruction or gangrene, not specified as recurrent: K40.90

## 2023-02-07 LAB — GLUCOSE, CAPILLARY
Glucose-Capillary: 180 mg/dL — ABNORMAL HIGH (ref 70–99)
Glucose-Capillary: 243 mg/dL — ABNORMAL HIGH (ref 70–99)
Glucose-Capillary: 225 mg/dL — ABNORMAL HIGH (ref 70–99)

## 2023-02-07 LAB — POCT PREGNANCY, URINE: Preg Test, Ur: NEGATIVE

## 2023-02-07 SURGERY — REPAIR, HERNIA, INGUINAL, BILATERAL, ROBOT-ASSISTED
Anesthesia: General | Site: Inguinal | Laterality: Left

## 2023-02-07 MED ORDER — FENTANYL CITRATE (PF) 100 MCG/2ML IJ SOLN
INTRAMUSCULAR | Status: AC
  Filled 2023-02-07: qty 2

## 2023-02-07 MED ORDER — CEFAZOLIN SODIUM-DEXTROSE 2-4 GM/100ML-% IV SOLN
INTRAVENOUS | Status: AC
  Filled 2023-02-07: qty 100

## 2023-02-07 MED ORDER — SEVOFLURANE IN SOLN
RESPIRATORY_TRACT | Status: AC
  Filled 2023-02-07: qty 250

## 2023-02-07 MED ORDER — OXYCODONE HCL 5 MG PO TABS
ORAL_TABLET | ORAL | Status: AC
  Filled 2023-02-07: qty 1

## 2023-02-07 MED ORDER — ACETAMINOPHEN 10 MG/ML IV SOLN
INTRAVENOUS | Status: AC
  Filled 2023-02-07: qty 100

## 2023-02-07 MED ORDER — INSULIN ASPART 100 UNIT/ML IJ SOLN
7.0000 [IU] | Freq: Once | INTRAMUSCULAR | Status: AC
  Administered 2023-02-07: 7 [IU] via SUBCUTANEOUS

## 2023-02-07 MED ORDER — KETOROLAC TROMETHAMINE 15 MG/ML IJ SOLN
INTRAMUSCULAR | Status: DC | PRN
Start: 2023-02-07 — End: 2023-02-07
  Administered 2023-02-07: 30 mg via INTRAVENOUS

## 2023-02-07 MED ORDER — OXYCODONE HCL 5 MG PO TABS
5.0000 mg | ORAL_TABLET | Freq: Once | ORAL | Status: AC | PRN
  Administered 2023-02-07: 5 mg via ORAL

## 2023-02-07 MED ORDER — PROPOFOL 10 MG/ML IV BOLUS
INTRAVENOUS | Status: DC | PRN
  Administered 2023-02-07: 100 mg via INTRAVENOUS
  Administered 2023-02-07: 150 ug/kg/min via INTRAVENOUS

## 2023-02-07 MED ORDER — MIDAZOLAM HCL 2 MG/2ML IJ SOLN
INTRAMUSCULAR | Status: DC | PRN
  Administered 2023-02-07: 2 mg via INTRAVENOUS

## 2023-02-07 MED ORDER — DEXMEDETOMIDINE HCL IN NACL 200 MCG/50ML IV SOLN
INTRAVENOUS | Status: DC | PRN
Start: 2023-02-07 — End: 2023-02-07
  Administered 2023-02-07: 8 ug via INTRAVENOUS
  Administered 2023-02-07 (×2): 4 ug via INTRAVENOUS

## 2023-02-07 MED ORDER — PROPOFOL 1000 MG/100ML IV EMUL
INTRAVENOUS | Status: AC
  Filled 2023-02-07: qty 200

## 2023-02-07 MED ORDER — LIDOCAINE HCL (CARDIAC) PF 100 MG/5ML IV SOSY
PREFILLED_SYRINGE | INTRAVENOUS | Status: DC | PRN
  Administered 2023-02-07: 100 mg via INTRAVENOUS

## 2023-02-07 MED ORDER — MIDAZOLAM HCL 2 MG/2ML IJ SOLN
INTRAMUSCULAR | Status: AC
  Filled 2023-02-07: qty 2

## 2023-02-07 MED ORDER — FAMOTIDINE 20 MG PO TABS
ORAL_TABLET | ORAL | Status: AC
  Filled 2023-02-07: qty 1

## 2023-02-07 MED ORDER — FENTANYL CITRATE (PF) 100 MCG/2ML IJ SOLN
25.0000 ug | INTRAMUSCULAR | Status: DC | PRN
  Administered 2023-02-07 (×3): 25 ug via INTRAVENOUS

## 2023-02-07 MED ORDER — FENTANYL CITRATE (PF) 100 MCG/2ML IJ SOLN
INTRAMUSCULAR | Status: DC | PRN
  Administered 2023-02-07 (×4): 50 ug via INTRAVENOUS

## 2023-02-07 MED ORDER — ROCURONIUM BROMIDE 100 MG/10ML IV SOLN
INTRAVENOUS | Status: DC | PRN
  Administered 2023-02-07: 30 mg via INTRAVENOUS
  Administered 2023-02-07: 20 mg via INTRAVENOUS
  Administered 2023-02-07: 50 mg via INTRAVENOUS

## 2023-02-07 MED ORDER — SUGAMMADEX SODIUM 200 MG/2ML IV SOLN
INTRAVENOUS | Status: DC | PRN
  Administered 2023-02-07: 200 mg via INTRAVENOUS

## 2023-02-07 MED ORDER — BUPIVACAINE-EPINEPHRINE (PF) 0.25% -1:200000 IJ SOLN
INTRAMUSCULAR | Status: AC
  Filled 2023-02-07: qty 30

## 2023-02-07 MED ORDER — BUPIVACAINE-EPINEPHRINE 0.25% -1:200000 IJ SOLN
INTRAMUSCULAR | Status: DC | PRN
  Administered 2023-02-07: 30 mL

## 2023-02-07 MED ORDER — HYDROCODONE-ACETAMINOPHEN 5-325 MG PO TABS: 1.0000 | ORAL_TABLET | ORAL | 0 refills | Status: AC | PRN

## 2023-02-07 MED ORDER — DROPERIDOL 2.5 MG/ML IJ SOLN: 0.6250 mg | Freq: Once | INTRAMUSCULAR | Status: DC | PRN

## 2023-02-07 MED ORDER — METOPROLOL TARTRATE 5 MG/5ML IV SOLN
INTRAVENOUS | Status: DC | PRN
  Administered 2023-02-07: 2 mg via INTRAVENOUS
  Administered 2023-02-07: 1 mg via INTRAVENOUS
  Administered 2023-02-07: 2 mg via INTRAVENOUS

## 2023-02-07 MED ORDER — DEXAMETHASONE SODIUM PHOSPHATE 10 MG/ML IJ SOLN
INTRAMUSCULAR | Status: DC | PRN
  Administered 2023-02-07: 10 mg via INTRAVENOUS

## 2023-02-07 MED ORDER — PROMETHAZINE HCL 25 MG/ML IJ SOLN: 6.2500 mg | INTRAMUSCULAR | Status: DC | PRN

## 2023-02-07 MED ORDER — CHLORHEXIDINE GLUCONATE 0.12 % MT SOLN
OROMUCOSAL | Status: AC
  Filled 2023-02-07: qty 15

## 2023-02-07 MED ORDER — ACETAMINOPHEN 10 MG/ML IV SOLN
INTRAVENOUS | Status: DC | PRN
  Administered 2023-02-07: 1000 mg via INTRAVENOUS

## 2023-02-07 MED ORDER — INSULIN ASPART 100 UNIT/ML IJ SOLN
INTRAMUSCULAR | Status: AC
  Filled 2023-02-07: qty 1

## 2023-02-07 MED ORDER — ONDANSETRON HCL 4 MG/2ML IJ SOLN
INTRAMUSCULAR | Status: DC | PRN
  Administered 2023-02-07: 4 mg via INTRAVENOUS

## 2023-02-07 MED ORDER — OXYCODONE HCL 5 MG/5ML PO SOLN: 5.0000 mg | Freq: Once | ORAL | Status: AC | PRN

## 2023-02-07 MED ORDER — ACETAMINOPHEN 10 MG/ML IV SOLN: 1000.0000 mg | Freq: Once | INTRAVENOUS | Status: DC | PRN

## 2023-02-07 SURGICAL SUPPLY — 48 items
ADH SKN CLS APL DERMABOND .7 (GAUZE/BANDAGES/DRESSINGS) ×1
BAG PRESSURE INF REUSE 1000 (BAG) IMPLANT
BLADE SURG SZ11 CARB STEEL (BLADE) ×1 IMPLANT
COVER TIP SHEARS 8 DVNC (MISCELLANEOUS) ×1 IMPLANT
COVER WAND RF STERILE (DRAPES) ×1 IMPLANT
DERMABOND ADVANCED .7 DNX12 (GAUZE/BANDAGES/DRESSINGS) ×1 IMPLANT
DRAPE ARM DVNC X/XI (DISPOSABLE) ×3 IMPLANT
DRAPE COLUMN DVNC XI (DISPOSABLE) ×1 IMPLANT
ELECT REM PT RETURN 9FT ADLT (ELECTROSURGICAL) ×1
ELECTRODE REM PT RTRN 9FT ADLT (ELECTROSURGICAL) ×1 IMPLANT
FORCEPS BPLR R/ABLATION 8 DVNC (INSTRUMENTS) ×1 IMPLANT
GLOVE BIO SURGEON STRL SZ 6.5 (GLOVE) ×2 IMPLANT
GLOVE BIOGEL PI IND STRL 6.5 (GLOVE) ×2 IMPLANT
GOWN STRL REUS W/ TWL LRG LVL3 (GOWN DISPOSABLE) ×3 IMPLANT
GOWN STRL REUS W/TWL LRG LVL3 (GOWN DISPOSABLE) ×3
IRRIGATOR SUCT 8 DISP DVNC XI (IRRIGATION / IRRIGATOR) IMPLANT
IV CATH ANGIO 12GX3 LT BLUE (NEEDLE) IMPLANT
IV NS 1000ML (IV SOLUTION)
IV NS 1000ML BAXH (IV SOLUTION) IMPLANT
KIT PINK PAD W/HEAD ARE REST (MISCELLANEOUS) ×1
KIT PINK PAD W/HEAD ARM REST (MISCELLANEOUS) ×1 IMPLANT
LABEL OR SOLS (LABEL) IMPLANT
MANIFOLD NEPTUNE II (INSTRUMENTS) ×1 IMPLANT
MESH 3DMAX MID 5X7 LT XLRG (Mesh General) IMPLANT
NDL DRIVE SUT CUT DVNC (INSTRUMENTS) ×1 IMPLANT
NDL HYPO 22X1.5 SAFETY MO (MISCELLANEOUS) ×1 IMPLANT
NDL INSUFFLATION 14GA 120MM (NEEDLE) ×1 IMPLANT
NEEDLE DRIVE SUT CUT DVNC (INSTRUMENTS) ×1
NEEDLE HYPO 22X1.5 SAFETY MO (MISCELLANEOUS) ×1
NEEDLE INSUFFLATION 14GA 120MM (NEEDLE) ×1
OBTURATOR OPTICAL STND 8 DVNC (TROCAR) ×1
OBTURATOR OPTICALSTD 8 DVNC (TROCAR) ×1 IMPLANT
PACK LAP CHOLECYSTECTOMY (MISCELLANEOUS) ×1 IMPLANT
SCISSORS MNPLR CVD DVNC XI (INSTRUMENTS) ×1 IMPLANT
SEAL UNIV 5-12 XI (MISCELLANEOUS) ×3 IMPLANT
SET TUBE SMOKE EVAC HIGH FLOW (TUBING) ×1 IMPLANT
SOL ELECTROSURG ANTI STICK (MISCELLANEOUS) ×1
SOLUTION ELECTROSURG ANTI STCK (MISCELLANEOUS) ×1 IMPLANT
SUT MNCRL 4-0 (SUTURE) ×1
SUT MNCRL 4-0 27XMFL (SUTURE) ×1
SUT VIC AB 2-0 SH 27 (SUTURE) ×1
SUT VIC AB 2-0 SH 27XBRD (SUTURE) ×1 IMPLANT
SUT VLOC 90 S/L VL9 GS22 (SUTURE) ×1 IMPLANT
SUTURE MNCRL 4-0 27XMF (SUTURE) ×1 IMPLANT
TAPE TRANSPORE STRL 2 31045 (GAUZE/BANDAGES/DRESSINGS) IMPLANT
TRAP FLUID SMOKE EVACUATOR (MISCELLANEOUS) ×1 IMPLANT
TRAY FOLEY MTR SLVR 16FR STAT (SET/KITS/TRAYS/PACK) ×1 IMPLANT
WATER STERILE IRR 500ML POUR (IV SOLUTION) ×1 IMPLANT

## 2023-02-07 NOTE — Anesthesia Procedure Notes (Signed)
Procedure Name: Intubation Date/Time: 02/07/2023 9:58 AM  Performed by: Maryla Morrow., CRNAPre-anesthesia Checklist: Patient identified, Patient being monitored, Timeout performed, Emergency Drugs available and Suction available Patient Re-evaluated:Patient Re-evaluated prior to induction Oxygen Delivery Method: Circle system utilized Preoxygenation: Pre-oxygenation with 100% oxygen Induction Type: IV induction Ventilation: Mask ventilation without difficulty Laryngoscope Size: 3 and McGraph Grade View: Grade I Tube type: Oral Tube size: 7.0 mm Number of attempts: 1 Airway Equipment and Method: Stylet Placement Confirmation: ETT inserted through vocal cords under direct vision, positive ETCO2 and breath sounds checked- equal and bilateral Secured at: 19 cm Tube secured with: Tape Dental Injury: Teeth and Oropharynx as per pre-operative assessment

## 2023-02-07 NOTE — Interval H&P Note (Signed)
History and Physical Interval Note:  02/07/2023 9:10 AM  Claudia Cannon  has presented today for surgery, with the diagnosis of K40.90 non recurrent unilateral inguinal hernia w/o obstruction or gangrene.  The various methods of treatment have been discussed with the patient and family. After consideration of risks, benefits and other options for treatment, the patient has consented to  Procedure(s): XI ROBOTIC ASSISTED BILATERAL INGUINAL HERNIA (Bilateral) as a surgical intervention.  The patient's history has been reviewed, patient examined, no change in status, stable for surgery.  I have reviewed the patient's chart and labs.  Questions were answered to the patient's satisfaction.     Carolan Shiver

## 2023-02-07 NOTE — Op Note (Signed)
Preoperative diagnosis: Left inguinal hernia.   Postoperative diagnosis: Left inguinal hernia.  Procedure: Robotic assisted Laparoscopic Transabdominal preperitoneal laparoscopic (TAPP) repair of left inguinal hernia.  Anesthesia: GETA  Surgeon: Dr. Hazle Quant  Wound Classification: Clean  Indications:  Patient is a 42 y.o. female developed a symptomatic left inguinal hernia. Repair was indicated.  Findings: 1. Left indirect Inguinal hernia identified 2. Vas deferens and cord structures identified and preserved 3. Bard Extra Large 3D Max MID Anatomical mesh used for repair 4. Adequate hemostasis.   Description of procedure:  The patient was taken to the operating room and the correct side of surgery was verified. The patient was placed supine with right arm tucked at the side. After obtaining adequate anesthesia, the patient's abdomen was prepped and draped in standard sterile fashion. A time-out was completed verifying correct patient, procedure, site, positioning, and implant(s) and/or special equipment prior to beginning this procedure.  An incision was made in a natural skin line above the umbilicus. The fascia was elevated and the Veress needle inserted. Proper position was confirmed by aspiration and saline meniscus test.  The abdomen was insufflated with carbon dioxide to a pressure of 15 mmHg. The patient tolerated insufflation well.  Abdominal cavity was entered using Optiview technique with a millimeter trocar.  No injury was identified.  Another 2 mm trocars were placed lateral to each rectus muscle.  Scissors and bipolar forceps were inserted under direct visualization. At the robotic console: Transverse peritoneal incision is made about 8 cm superior to the inguinal defect. Medial to the epigastric vessels, the parietal compartment is dissected to visualize the rectus muscle. This is carried down to the symphysis pubis and the retropubic space is dissected to expose at least 2  cm contralateral to the midline. Cooper's ligament is exposed and cleared at least 2 cm below the ligament to ensure adequate space for the inferior border of the mesh. Hesselbach's triangle is cleared assessing for a direct hernia. Lateral to the epigastric vessels, the dissection is carried out in visceral compartment continuing in the true preperitoneal plane. Indirect hernia sac, was carefully reduced and separated from the cord structures with medial retraction and a combination of blunt/sharp dissection and focused cautery. This dissection was continued until the cord structures are "parietalized" completely, allowing for visualization of the reflected peritoneum that is continuous with the line originating 2 cm below Coopers medially and across the psoas muscle in the lateral compartment.  The internal ring was interrogated for a cord lipoma. The cord lipoma was reduced to the retroperitoneum and seated dorsal to the preperitoneal mesh. Having achieved a complete dissection with a critical view of the entire myopectineal orifice, an XL mesh was then positioned centered at the iliopubic tract with the medial side crossing the midline and the inferior edge positioned 2 cm below Coopers ligament. The lateral aspect of the mesh extended 3-5 cm beyond the lateral edge of the psoas. The mesh is fixated using an interrupted suture placed to the ipsilateral Coopers ligament. A second suture was done at the medial superior aspect of the mesh fixating this to the rectus complex.  The peritoneal flap is closed with running barbed suture. Additional holes in the peritoneum closed with suture. Preperitoneal space gas aspirated to visualize the peritoneum apposed directly against the mesh and ensure no folding, lifting, or buckling of the mesh. Skin is closed, sterile dressings are applied.  The patient tolerated the procedure well and was taken to the postanesthesia care unit in stable  condition  Specimen:  None  Complications: None  Estimated Blood Loss: 5 mL

## 2023-02-07 NOTE — Anesthesia Preprocedure Evaluation (Signed)
Anesthesia Evaluation  Patient identified by MRN, date of birth, ID band Patient awake    Reviewed: Allergy & Precautions, H&P , NPO status , Patient's Chart, lab work & pertinent test results, reviewed documented beta blocker date and time   History of Anesthesia Complications (+) PONV and history of anesthetic complications  Airway Mallampati: II  TM Distance: >3 FB Neck ROM: full    Dental  (+) Teeth Intact   Pulmonary neg pulmonary ROS   Pulmonary exam normal        Cardiovascular Exercise Tolerance: Good negative cardio ROS Normal cardiovascular exam Rhythm:regular Rate:Normal     Neuro/Psych negative neurological ROS  negative psych ROS   GI/Hepatic negative GI ROS, Neg liver ROS,,,  Endo/Other  negative endocrine ROSdiabetes, Well Controlled    Renal/GU Renal disease  negative genitourinary   Musculoskeletal   Abdominal   Peds  Hematology negative hematology ROS (+)   Anesthesia Other Findings Past Medical History: No date: Acute cystitis with hematuria No date: Diabetes mellitus without complication (HCC) No date: Hypertriglyceridemia No date: Non-recurrent unilateral inguinal hernia without obstruction  or gangrene No date: PONV (postoperative nausea and vomiting) No date: Pyelonephritis Past Surgical History: No date: ECTOPIC PREGNANCY SURGERY; Right No date: TUBAL LIGATION BMI    Body Mass Index: 30.47 kg/m     Reproductive/Obstetrics negative OB ROS                             Anesthesia Physical Anesthesia Plan  ASA: 2  Anesthesia Plan: General ETT   Post-op Pain Management:    Induction:   PONV Risk Score and Plan: 4 or greater  Airway Management Planned: Oral ETT and Video Laryngoscope Planned  Additional Equipment:   Intra-op Plan:   Post-operative Plan:   Informed Consent: I have reviewed the patients History and Physical, chart, labs and  discussed the procedure including the risks, benefits and alternatives for the proposed anesthesia with the patient or authorized representative who has indicated his/her understanding and acceptance.     Dental Advisory Given  Plan Discussed with: CRNA  Anesthesia Plan Comments:        Anesthesia Quick Evaluation

## 2023-02-07 NOTE — Discharge Instructions (Addendum)
  Diet: Resume home heart healthy regular diet.   Activity: No heavy lifting >20 pounds (children, pets, laundry, garbage) or strenuous activity until follow-up, but light activity and walking are encouraged. Do not drive or drink alcohol if taking narcotic pain medications.  Wound care: May shower with soapy water and pat dry (do not rub incisions), but no baths or submerging incision underwater until follow-up. (no swimming)   Medications: Resume all home medications. For mild to moderate pain: acetaminophen (Tylenol) or ibuprofen (if no kidney disease). Combining Tylenol with alcohol can substantially increase your risk of causing liver disease. Narcotic pain medications, if prescribed, can be used for severe pain, though may cause nausea, constipation, and drowsiness. Do not combine Tylenol and Norco within a 6 hour period as Norco contains Tylenol. If you do not need the narcotic pain medication, you do not need to fill the prescription.  Call office [phone]) at any time if any questions, worsening pain, fevers/chills, bleeding, drainage from incision site, or other concerns.    CIRUGIA AMBULATORIA       Instruccionnes de alta    Date Franco Nones) 02/07/23   1.  Las drogas que se Dispensing optician en su cuerpo [employer], asi            que por las proximas 24 horas usted no debe:   Conducir Field seismologist) un automovil   Hacer ninguna decision legal   Tomar ninguna bebida alcoholica  2.  A) Manana puede comenzar una dieta regular.  Es mejor que hoy empiece con           liquidos y gradualmente anada [address]       B) Puede comer cualquier comida que desee pero es mejor empezar con liquidos,                      luego sopitas con galletas saladas y gradualmente llegar a las comidas solidas.  3.  Por favor avise a su medico inmediatamente si usted tiene algun sangrado anormal,       tiene dificultad con la respiracion, enrojecimiento y Engineer, mining en el sitio de la  cirugia, [place],       fiebro o dolor que se alivia con [place].  4.  A) Su visita posoperatoria (despues de su operacion) es con el                          B)  Por favor llame para hacer la cita posoperatoria.  5.  Istrucciones especificas :

## 2023-02-07 NOTE — Transfer of Care (Signed)
Immediate Anesthesia Transfer of Care Note  Patient: Claudia Cannon  Procedure(s) Performed: XI ROBOTIC ASSISTED BILATERAL INGUINAL HERNIA (Left: Inguinal) XI ROBOTIC ASSISTED INGUINAL HERNIA REPAIR WITH MESH (Left: Inguinal)  Patient Location: PACU  Anesthesia Type:General  Level of Consciousness: drowsy and patient cooperative  Airway & Oxygen Therapy: Patient Spontanous Breathing and Patient connected to face mask oxygen  Post-op Assessment: Report given to RN and Post -op Vital signs reviewed and stable  Post vital signs: stable  Last Vitals:  Vitals Value Taken Time  BP    Temp    Pulse    Resp    SpO2      Last Pain:  Vitals:   02/07/23 0849  TempSrc: Oral  PainSc: 0-No pain         Complications: No notable events documented.

## 2023-02-08 ENCOUNTER — Encounter: Payer: Self-pay | Admitting: General Surgery

## 2023-02-11 NOTE — Anesthesia Postprocedure Evaluation (Signed)
Anesthesia Post Note  Patient: Claudia Cannon  Procedure(s) Performed: XI ROBOTIC ASSISTED BILATERAL INGUINAL HERNIA (Left: Inguinal) XI ROBOTIC ASSISTED INGUINAL HERNIA REPAIR WITH MESH (Left: Inguinal)  Patient location during evaluation: PACU Anesthesia Type: General Level of consciousness: awake and alert Pain management: pain level controlled Vital Signs Assessment: post-procedure vital signs reviewed and stable Respiratory status: spontaneous breathing, nonlabored ventilation, respiratory function stable and patient connected to nasal cannula oxygen Cardiovascular status: blood pressure returned to baseline and stable Postop Assessment: no apparent nausea or vomiting Anesthetic complications: no   There were no known notable events for this encounter.   Last Vitals:  Vitals:   02/07/23 1245 02/07/23 1302  BP: 96/60 96/61  Pulse: 72 73  Resp: 18 18  Temp:  36.6 C  SpO2: 98% 96%    Last Pain:  Vitals:   02/07/23 1302  TempSrc: Oral  PainSc: 3                  Yevette Edwards

## 2023-04-30 ENCOUNTER — Other Ambulatory Visit: Payer: Self-pay

## 2023-04-30 DIAGNOSIS — R1013 Epigastric pain: Secondary | ICD-10-CM | POA: Insufficient documentation

## 2023-04-30 DIAGNOSIS — R197 Diarrhea, unspecified: Secondary | ICD-10-CM | POA: Diagnosis not present

## 2023-04-30 DIAGNOSIS — R112 Nausea with vomiting, unspecified: Secondary | ICD-10-CM | POA: Diagnosis not present

## 2023-04-30 DIAGNOSIS — Z5321 Procedure and treatment not carried out due to patient leaving prior to being seen by health care provider: Secondary | ICD-10-CM | POA: Diagnosis not present

## 2023-04-30 LAB — CBC
HCT: 41 % (ref 36.0–46.0)
Hemoglobin: 14.8 g/dL (ref 12.0–15.0)
MCH: 30.2 pg (ref 26.0–34.0)
MCHC: 36.1 g/dL — ABNORMAL HIGH (ref 30.0–36.0)
MCV: 83.7 fL (ref 80.0–100.0)
Platelets: 344 10*3/uL (ref 150–400)
RBC: 4.9 MIL/uL (ref 3.87–5.11)
RDW: 12.4 % (ref 11.5–15.5)
WBC: 19.2 10*3/uL — ABNORMAL HIGH (ref 4.0–10.5)
nRBC: 0 % (ref 0.0–0.2)

## 2023-04-30 LAB — COMPREHENSIVE METABOLIC PANEL
ALT: 235 U/L — ABNORMAL HIGH (ref 0–44)
AST: 127 U/L — ABNORMAL HIGH (ref 15–41)
Albumin: 4.2 g/dL (ref 3.5–5.0)
Alkaline Phosphatase: 80 U/L (ref 38–126)
Anion gap: 15 (ref 5–15)
BUN: 16 mg/dL (ref 6–20)
CO2: 22 mmol/L (ref 22–32)
Calcium: 9.5 mg/dL (ref 8.9–10.3)
Chloride: 97 mmol/L — ABNORMAL LOW (ref 98–111)
Creatinine, Ser: 0.69 mg/dL (ref 0.44–1.00)
GFR, Estimated: >60 mL/min (ref >60)
Glucose, Bld: 217 mg/dL — ABNORMAL HIGH (ref 70–99)
Potassium: 3.5 mmol/L (ref 3.5–5.1)
Sodium: 134 mmol/L — ABNORMAL LOW (ref 135–145)
Total Bilirubin: 1.2 mg/dL — ABNORMAL HIGH (ref <1.2)
Total Protein: 7.7 g/dL (ref 6.5–8.1)

## 2023-04-30 LAB — LIPASE, BLOOD: Lipase: 115 U/L — ABNORMAL HIGH (ref 11–51)

## 2023-04-30 MED ORDER — ONDANSETRON 4 MG PO TBDP
4.0000 mg | ORAL_TABLET | Freq: Once | ORAL | Status: AC | PRN
  Administered 2023-04-30: 4 mg via ORAL
  Filled 2023-04-30: qty 1

## 2023-04-30 NOTE — ED Triage Notes (Signed)
Pt to ED via POV c/o epigastric pain that started around 7pm. Pt endorses nausea, vomiting and diarrhea with pain. Pt says pain is getting worse. Denies fevers, cp, sob, dizziness

## 2023-05-01 ENCOUNTER — Emergency Department
Admission: EM | Admit: 2023-05-01 | Discharge: 2023-05-01 | Discharge status: Against Medical Advice | Payer: Medicaid Other | Attending: Emergency Medicine | Admitting: Emergency Medicine

## 2023-05-01 ENCOUNTER — Emergency Department: Payer: Medicaid Other

## 2023-05-01 ENCOUNTER — Telehealth: Payer: Self-pay | Admitting: Emergency Medicine

## 2023-05-01 NOTE — Telephone Encounter (Signed)
Called patient due to left emergency department before provider exam to inquire about condition and follow up plans.  Claudia Cannon [facility] interpretter interpretted.   Informed pateint that she does have abnormal labs and should see a doctor.  She does have a pcp and will call them.  She asked also about urgent care. I told her that I did not think they would see her at urgent care.  I told her that I understand it may take a long time here in the ED, but we can see her here.  She says she will call her doctor now.

## 2023-05-01 NOTE — ED Notes (Signed)
Called x1 for ultrasound

## 2023-12-02 ENCOUNTER — Other Ambulatory Visit: Payer: Self-pay | Admitting: Nurse Practitioner

## 2023-12-02 DIAGNOSIS — Z1231 Encounter for screening mammogram for malignant neoplasm of breast: Secondary | ICD-10-CM

## 2024-01-20 ENCOUNTER — Ambulatory Visit
Admission: RE | Admit: 2024-01-20 | Discharge: 2024-01-20 | Disposition: A | Discharge status: Home / Self Care | Source: Ambulatory Visit | Attending: Nurse Practitioner | Admitting: Nurse Practitioner

## 2024-01-20 DIAGNOSIS — Z1231 Encounter for screening mammogram for malignant neoplasm of breast: Secondary | ICD-10-CM | POA: Insufficient documentation

## 2024-02-06 ENCOUNTER — Ambulatory Visit: Admission: EM | Admit: 2024-02-06 | Discharge: 2024-02-06 | Disposition: A | Discharge status: Home / Self Care

## 2024-02-06 ENCOUNTER — Emergency Department
Admission: EM | Admit: 2024-02-06 | Discharge: 2024-02-06 | Disposition: A | Discharge status: Home / Self Care | Attending: Emergency Medicine | Admitting: Emergency Medicine

## 2024-02-06 ENCOUNTER — Other Ambulatory Visit: Payer: Self-pay

## 2024-02-06 ENCOUNTER — Emergency Department

## 2024-02-06 DIAGNOSIS — R519 Headache, unspecified: Secondary | ICD-10-CM | POA: Diagnosis present

## 2024-02-06 DIAGNOSIS — R200 Anesthesia of skin: Secondary | ICD-10-CM | POA: Diagnosis not present

## 2024-02-06 DIAGNOSIS — E119 Type 2 diabetes mellitus without complications: Secondary | ICD-10-CM | POA: Insufficient documentation

## 2024-02-06 DIAGNOSIS — R29810 Facial weakness: Secondary | ICD-10-CM | POA: Diagnosis not present

## 2024-02-06 DIAGNOSIS — G43909 Migraine, unspecified, not intractable, without status migrainosus: Secondary | ICD-10-CM | POA: Insufficient documentation

## 2024-02-06 LAB — BASIC METABOLIC PANEL WITH GFR
Anion gap: 11 (ref 5–15)
BUN: 22 mg/dL — ABNORMAL HIGH (ref 6–20)
CO2: 23 mmol/L (ref 22–32)
Calcium: 8.9 mg/dL (ref 8.9–10.3)
Chloride: 105 mmol/L (ref 98–111)
Creatinine, Ser: 0.95 mg/dL (ref 0.44–1.00)
GFR, Estimated: >60 mL/min (ref >60)
Glucose, Bld: 102 mg/dL — ABNORMAL HIGH (ref 70–99)
Potassium: 3.6 mmol/L (ref 3.5–5.1)
Sodium: 139 mmol/L (ref 135–145)

## 2024-02-06 LAB — CBC WITH DIFFERENTIAL/PLATELET
Abs Immature Granulocytes: 0.04 K/uL (ref 0.00–0.07)
Basophils Absolute: 0.1 K/uL (ref 0.0–0.1)
Basophils Relative: 1 %
Eosinophils Absolute: 0.2 K/uL (ref 0.0–0.5)
Eosinophils Relative: 2 %
HCT: 39.7 % (ref 36.0–46.0)
Hemoglobin: 13.8 g/dL (ref 12.0–15.0)
Immature Granulocytes: 0 %
Lymphocytes Relative: 38 %
Lymphs Abs: 4.2 K/uL — ABNORMAL HIGH (ref 0.7–4.0)
MCH: 30.7 pg (ref 26.0–34.0)
MCHC: 34.8 g/dL (ref 30.0–36.0)
MCV: 88.4 fL (ref 80.0–100.0)
Monocytes Absolute: 0.7 K/uL (ref 0.1–1.0)
Monocytes Relative: 6 %
Neutro Abs: 5.9 K/uL (ref 1.7–7.7)
Neutrophils Relative %: 53 %
Platelets: 327 K/uL (ref 150–400)
RBC: 4.49 MIL/uL (ref 3.87–5.11)
RDW: 12 % (ref 11.5–15.5)
WBC: 11 K/uL — ABNORMAL HIGH (ref 4.0–10.5)
nRBC: 0 % (ref 0.0–0.2)

## 2024-02-06 MED ORDER — DIPHENHYDRAMINE HCL 50 MG/ML IJ SOLN
25.0000 mg | Freq: Once | INTRAMUSCULAR | Status: AC
  Administered 2024-02-06: 25 mg via INTRAVENOUS
  Filled 2024-02-06: qty 1

## 2024-02-06 MED ORDER — IOHEXOL 350 MG/ML SOLN
75.0000 mL | Freq: Once | INTRAVENOUS | Status: AC | PRN
  Administered 2024-02-06: 75 mL via INTRAVENOUS

## 2024-02-06 MED ORDER — METOCLOPRAMIDE HCL 10 MG PO TABS: 10.0000 mg | ORAL_TABLET | Freq: Three times a day (TID) | ORAL | 0 refills | Status: AC | PRN

## 2024-02-06 MED ORDER — PREDNISONE 50 MG PO TABS: 50.0000 mg | ORAL_TABLET | Freq: Every day | ORAL | 0 refills | Status: AC

## 2024-02-06 MED ORDER — METOCLOPRAMIDE HCL 5 MG/ML IJ SOLN
10.0000 mg | Freq: Once | INTRAMUSCULAR | Status: AC
  Administered 2024-02-06: 10 mg via INTRAVENOUS
  Filled 2024-02-06: qty 2

## 2024-02-06 NOTE — Discharge Instructions (Addendum)
 Tome el medicamento segn sea necesario. Regrese a urgencias si presenta dolor de cabeza intenso, persistente o que empeora, vmitos, cambios en la visin, dificultad para hablar, debilidad o entumecimiento en brazos o piernas, o cualquier otro sntoma nuevo o que empeore que le preocupe.

## 2024-02-06 NOTE — ED Provider Notes (Signed)
 [facility] Provider Note    Event Date/Time   First MD Initiated Contact with Patient 02/06/24 1824     (approximate)   History   Headache   HPI  HPI obtained via in person Spanish interpreter  Claudia Cannon is a 43 y.o. female with a history of diabetes who presents with a headache for the last 3 days, acute onset, persistent course, similar intensity throughout the last 3 days.  It is unilateral, on the left side of her head.  It is not throbbing.  It was associated with some nausea, but the patient denies nausea now.  She has no photophobia or vision changes.  She states that this morning when she got up and brush her teeth she noticed that the left side of her face was drooping.  Her last known well was when she went to sleep around 11 PM last night.  The facial droop has remained since that time.  She also has numbness and tingling in the left side of her face including her forehead.  She does not have any numbness, tingling, or weakness in any extremities.  She denies any difficulty with speech.  She denies any prior history of similar headaches.  I reviewed the past medical records.  Prior to today, when she was sent in from urgent care for further evaluation of this headache, the patient was last seen by rheumatology on 12/2 for follow-up of her chronic conditions.     Physical Exam   Triage Vital Signs: ED Triage Vitals [02/06/24 1822]  Encounter Vitals Group     BP (!) 128/97     Girls Systolic BP Percentile      Girls Diastolic BP Percentile      Boys Systolic BP Percentile      Boys Diastolic BP Percentile      Pulse Rate 82     Resp 18     Temp 98.1 F (36.7 C)     Temp Source Oral     SpO2 97 %     Weight      Height      Head Circumference      Peak Flow      Pain Score 6     Pain Loc      Pain Education      Exclude from Growth Chart     Most recent vital signs: Vitals:   02/06/24 2130 02/06/24 2241  BP:   122/73  Pulse: 79 76  Resp:  18  Temp:  97.8 F (36.6 C)  SpO2: 100% 100%     General: Alert, well-appearing, no distress.  CV:  Good peripheral perfusion.  Resp:  Normal effort.  Abd:  No distention.  Other:  EOMI.  PERRLA.  No photophobia.  Left-sided facial droop that appears to involve the forehead.  Decreased sensation to left side of face including forehead.  5/5 motor strength and intact sensation to bilateral upper and lower extremities.  No ataxia on finger-to-nose.  No pronator drift.  Normal speech.  Neck supple, no meningeal signs.   ED Results / Procedures / Treatments   Labs (all labs ordered are listed, but only abnormal results are displayed) Labs Reviewed  BASIC METABOLIC PANEL WITH GFR - Abnormal; Notable for the following components:      Result Value   Glucose, Bld 102 (*)    BUN 22 (*)    All other components within normal limits  CBC WITH DIFFERENTIAL/PLATELET -  Abnormal; Notable for the following components:   WBC 11.0 (*)    Lymphs Abs 4.2 (*)    All other components within normal limits     EKG     RADIOLOGY  CTA head/neck: I independently viewed and interpreted the images; there is no ICH.  Radiology report indicates the following:  IMPRESSION:  1. Normal CTA of the head and neck. No large vessel occlusion,  hemodynamically significant stenosis, or other acute vascular  abnormality. No aneurysm.  2. No other acute intracranial abnormality.    MR brain:  IMPRESSION:  Normal brain MRI. No acute intracranial abnormality identified.    PROCEDURES:  Critical Care performed: No  Procedures   MEDICATIONS ORDERED IN ED: Medications  metoCLOPramide  (REGLAN ) injection 10 mg (10 mg Intravenous Given 02/06/24 1937)  diphenhydrAMINE  (BENADRYL ) injection 25 mg (25 mg Intravenous Given 02/06/24 1937)  iohexol  (OMNIPAQUE ) 350 MG/ML injection 75 mL (75 mLs Intravenous Contrast Given 02/06/24 2024)     IMPRESSION / MDM / ASSESSMENT AND PLAN /  ED COURSE  I reviewed the triage vital signs and the nursing notes.  43 year old female with PMH as noted above presents with headache for the last 3 days, associated since this morning with left-sided facial droop but no other focal neurologic symptoms.  On exam her vital signs are normal.  Thorough neurologic exam is nonfocal except for the left-sided facial droop that appears to involve the forehead.  Differential diagnosis includes, but is not limited to, complex migraine, Bell's palsy, less likely acute CVA, aneurysm, or other acute CNS etiology.  I doubt herpes zoster.  The patient has no rash.  There is no evidence of meningitis.  I have a low suspicion for [facility].  We will obtain CT/CTA of the head and neck, give migraine cocktail, and reassess.  Patient's presentation is most consistent with acute presentation with potential threat to life or bodily function.  ----------------------------------------- 11:25 PM on 02/06/2024 -----------------------------------------  BMP and CBC show no acute findings.  CTA was negative.  I proceeded with an MRI which is also negative for acute findings.  There is no clinical evidence for acute CVA or TIA.  The presentation is still most consistent with complex migraine, although Bell's palsy is possible.  On reassessment the patient states that the headache is much better.  She still has some facial droop although it appears improved as well.  I counseled her on the results of the workup and plan of care via the video interpreter.  She is stable for discharge at this time.  There is no indication for further monitoring or neurological workup.  I have prescribed prednisone  to cover for possible Bell's palsy as well as Reglan  for symptomatic treatment of her headache.  I gave strict return precautions, and she expressed understanding.   FINAL CLINICAL IMPRESSION(S) / ED DIAGNOSES   Final diagnoses:  Migraine without status migrainosus, not intractable,  unspecified migraine type     Rx / DC Orders   ED Discharge Orders          Ordered    predniSONE  (DELTASONE ) 50 MG tablet  Daily        02/06/24 2324    metoCLOPramide  (REGLAN ) 10 MG tablet  Every 8 hours PRN        02/06/24 2324             Note:  This document was prepared using Dragon voice recognition software and may include unintentional dictation errors.    Jacolyn Pae,  MD 02/06/24 2326

## 2024-02-06 NOTE — ED Triage Notes (Signed)
 Pt to ED via POV from UC. Pt reports HA x3-4 days. Pt reports pain is on left side of head and ear. Denies N/V/D. No vision changes.

## 2024-02-06 NOTE — ED Triage Notes (Signed)
 Pt c/o headache x3-4days  Pt states that the pain moves into her left ear and she currently has a earache.

## 2024-02-06 NOTE — ED Provider Notes (Signed)
 [facility]    CSN: [id number] Arrival date & time: 02/06/24  1630      History   Chief Complaint Chief Complaint  Patient presents with   Headache    HPI Claudia Cannon is a 43 y.o. female with history of diabetes and hyperlipidemia presents for 4-day history of left-sided headache.  She reports left frontotemporal headache, pain around the left ear and to the left jaw.  States today she started to notice facial drooping and numbness on the left side.  Headache has gotten worse.  Denies vision changes, nausea/vomiting, dizziness, chest pain, shortness of breath, palpitations.  No difficulty speaking or walking. No paresthesias or weakness of extremities. A family member brought her to the urgent care.  She has no history of migraines or significant headaches. No head injury. No other concerns.  HPI  Past Medical History:  Diagnosis Date   Acute cystitis with hematuria    Diabetes mellitus without complication (HCC)    Hypertriglyceridemia    Non-recurrent unilateral inguinal hernia without obstruction or gangrene    PONV (postoperative nausea and vomiting)    Pyelonephritis     Patient Active Problem List   Diagnosis Date Noted   Postpartum fever 07/04/2022   Encounter for induction of labor 07/02/2022   NSVD (normal spontaneous vaginal delivery) 07/02/2022   Uterine contractions during pregnancy 06/22/2022   Susceptible to varicella (non-immune), currently pregnant 05/21/2022   AMA (advanced maternal age) multigravida 35+, first trimester 04/16/2022   Vaginal discharge during pregnancy in second trimester 04/05/2022   Pre-existing type 2 diabetes mellitus during pregnancy, antepartum 03/19/2022   Sepsis (HCC)    SIRS (systemic inflammatory response syndrome) (HCC) 01/11/2022   Type 2 diabetes mellitus with complication, with long-term current use of insulin  (HCC) 01/11/2022   Abdominal pain 01/11/2022   Pregnancy 11/13/2021   Supervision of  high-risk pregnancy of elderly multigravida, third trimester 11/13/2021   Hx of ectopic pregnancy 10/26/2020   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   Other acute pancreatitis without necrosis or infection 09/20/2017   Hypertriglyceridemia 09/20/2017   Hyperglycemia without ketosis 09/20/2017   Acute pancreatitis 09/20/2017    Past Surgical History:  Procedure Laterality Date   ECTOPIC PREGNANCY SURGERY Right    TUBAL LIGATION     XI ROBOTIC ASSISTED INGUINAL HERNIA REPAIR WITH MESH Left 02/07/2023   Procedure: XI ROBOTIC ASSISTED INGUINAL HERNIA REPAIR WITH MESH;  Surgeon: Rodolph Romano, MD;  Location: [facility] ORS;  Service: General;  Laterality: Left;    OB History     Gravida  6   Para  5   Term  5   Preterm      AB      Living  5      SAB      IAB      Ectopic      Multiple  0   Live Births  5            Home Medications    Prior to Admission medications   Medication Sig Start Date End Date Taking? Authorizing Provider  acetaminophen  (TYLENOL ) 325 MG tablet Take 2 tablets (650 mg total) by mouth every 4 (four) hours as needed (for pain scale < 4). 07/05/22  Yes Dickerson, Felicia, CNM  metFORMIN  (GLUCOPHAGE ) 500 MG tablet Take 1 tablet (500 mg total) by mouth 2 (two) times daily with a meal. 07/05/22 02/04/23  Aisha Heller, CNM    Family History Family History  Problem Relation  Age of Onset   Diabetes Mother    Hypertension Mother    Hyperlipidemia Mother    Chronic Renal Failure Mother     Social History Social History   Tobacco Use   Smoking status: Never   Smokeless tobacco: Never  Vaping Use   Vaping status: Never Used  Substance Use Topics   Alcohol use: No   Drug use: No     Allergies   Patient has no known allergies.   Review of Systems Review of Systems  Constitutional:  Negative for chills, diaphoresis, fatigue and fever.  HENT:  Positive for ear pain. Negative for congestion, rhinorrhea,  sinus pain and sore throat.   Eyes:  Negative for photophobia and visual disturbance.  Respiratory:  Negative for cough and shortness of breath.   Cardiovascular:  Negative for chest pain.  Gastrointestinal:  Negative for abdominal pain, nausea and vomiting.  Musculoskeletal:  Negative for arthralgias and myalgias.  Skin:  Negative for rash.  Neurological:  Positive for weakness, numbness and headaches. Negative for dizziness. Tremors: left sided facial weakness. Hematological:  Negative for adenopathy.  Psychiatric/Behavioral:  Negative for confusion.      Physical Exam Triage Vital Signs ED Triage Vitals  Encounter Vitals Group     BP 02/06/24 1645 130/86     Girls Systolic BP Percentile --      Girls Diastolic BP Percentile --      Boys Systolic BP Percentile --      Boys Diastolic BP Percentile --      Pulse Rate 02/06/24 1645 86     Resp --      Temp 02/06/24 1645 98.2 F (36.8 C)     Temp Source 02/06/24 1645 Oral     SpO2 02/06/24 1645 97 %     Weight 02/06/24 1645 156 lb 3.2 oz (70.9 kg)     Height --      Head Circumference --      Peak Flow --      Pain Score 02/06/24 1644 5     Pain Loc --      Pain Education --      Exclude from Growth Chart --    No data found.  Updated Vital Signs BP 130/86 (BP Location: Left Arm)   Pulse 86   Temp 98.2 F (36.8 C) (Oral)   Wt 156 lb 3.2 oz (70.9 kg)   LMP 02/05/2024   SpO2 97%   BMI 30.51 kg/m     Physical Exam Vitals and nursing note reviewed.  Constitutional:      General: She is not in acute distress.    Appearance: Normal appearance. She is not ill-appearing or toxic-appearing.  HENT:     Head: Normocephalic and atraumatic.     Nose: Nose normal.     Mouth/Throat:     Mouth: Mucous membranes are moist.     Pharynx: Oropharynx is clear.  Eyes:     General: No scleral icterus.       Right eye: No discharge.        Left eye: No discharge.     Extraocular Movements: Extraocular movements intact.      Conjunctiva/sclera: Conjunctivae normal.     Pupils: Pupils are equal, round, and reactive to light.  Cardiovascular:     Rate and Rhythm: Normal rate and regular rhythm.     Heart sounds: Normal heart sounds.  Pulmonary:     Effort: Pulmonary effort is normal. No respiratory distress.  Breath sounds: Normal breath sounds.  Musculoskeletal:     Cervical back: Neck supple.  Skin:    General: Skin is dry.  Neurological:     General: No focal deficit present.     Mental Status: She is alert and oriented to person, place, and time. Mental status is at baseline.     Cranial Nerves: Cranial nerve deficit (left corner of mouth does not turn up like right side. When asked to puff out cheeks, is unable to on the left side) present.     Motor: No weakness.     Coordination: Coordination normal.     Gait: Gait normal.  Psychiatric:        Mood and Affect: Mood normal.        Behavior: Behavior normal.      UC Treatments / Results  Labs (all labs ordered are listed, but only abnormal results are displayed) Labs Reviewed - No data to display  EKG   Radiology No results found.  Procedures Procedures (including critical care time)  Medications Ordered in UC Medications - No data to display  Initial Impression / Assessment and Plan / UC Course  I have reviewed the triage vital signs and the nursing notes.  Pertinent labs & imaging results that were available during my care of the patient were reviewed by me and considered in my medical decision making (see chart for details).   43 year old female with history of diabetes and hyperlipidemia presents for left-sided headaches x 4 days with onset of left sided facial weakness and numbness today.  Vitals are stable and normal.  She is overall well-appearing.  No acute distress.  On exam she has difficulty with the left side of her mouth when smiling.  It does not elevate is high as the right side and she is not able to puff both of  her cheeks out.  Unable to do so on the left side.  Remainder of exam is normal.  Possible complicated migraine versus Bell's palsy versus TIA/CVA.   Advised going to ER immediately. Needs imaging of head to rule out TIA/CVA and intracranial abnormality. Patient is understanding.  Declined EMS transport. Family member will take to [facility]. Leaving in stable condition.   Final Clinical Impressions(s) / UC Diagnoses   Final diagnoses:  Left-sided headache  Facial weakness  Left facial numbness     Discharge Instructions      - Please go to the ER for possible CT imaging of your head since you have severe persistent headache for several days with associated facial weakness and numbness. -This could be a complicated migraine but you have no history of such migraines so I suggest ED evaluation.  You have been advised to follow up immediately in the emergency department for concerning signs.symptoms. If you declined EMS transport, please have a family member take you directly to the ED at this time. Do not delay. Based on concerns about condition, if you do not follow up in th e ED, you may risk poor outcomes including worsening of condition, delayed treatment and potentially life threatening issues. If you have declined to go to the ED at this time, you should call your PCP immediately to set up a follow up appointment.  Go to ED for red flag symptoms, including; fevers you cannot reduce with Tylenol /Motrin , severe headaches, vision changes, numbness/weakness in part of the body, lethargy, confusion, intractable vomiting, severe dehydration, chest pain, breathing difficulty, severe persistent abdominal or pelvic pain, signs of severe  infection (increased redness, swelling of an area), feeling faint or passing out, dizziness, etc. You should especially go to the ED for sudden acute worsening of condition if you do not elect to go at this time.      ED Prescriptions   None    PDMP not reviewed  this encounter.   Arvis Jolan NOVAK, PA-C 02/07/24 [phone]

## 2024-02-06 NOTE — Discharge Instructions (Addendum)
-   Please go to the ER for possible CT imaging of your head since you have severe persistent headache for several days with associated facial weakness and numbness. -This could be a complicated migraine but you have no history of such migraines so I suggest ED evaluation.  You have been advised to follow up immediately in the emergency department for concerning signs.symptoms. If you declined EMS transport, please have a family member take you directly to the ED at this time. Do not delay. Based on concerns about condition, if you do not follow up in th e ED, you may risk poor outcomes including worsening of condition, delayed treatment and potentially life threatening issues. If you have declined to go to the ED at this time, you should call your PCP immediately to set up a follow up appointment.  Go to ED for red flag symptoms, including; fevers you cannot reduce with Tylenol /Motrin , severe headaches, vision changes, numbness/weakness in part of the body, lethargy, confusion, intractable vomiting, severe dehydration, chest pain, breathing difficulty, severe persistent abdominal or pelvic pain, signs of severe infection (increased redness, swelling of an area), feeling faint or passing out, dizziness, etc. You should especially go to the ED for sudden acute worsening of condition if you do not elect to go at this time.

## 2024-02-06 NOTE — ED Notes (Signed)
 Patient is being discharged from the Urgent Care and sent to the Emergency Department via Personal Vehicle . Per [place], [place], patient is in need of higher level of care due to Facial Weakness. Patient is aware and verbalizes understanding of plan of care.  Vitals:   02/06/24 1645  BP: 130/86  Pulse: 86  Temp: 98.2 F (36.8 C)  SpO2: 97%
